# Patient Record
Sex: Female | Born: 1940
Health system: Southern US, Community
[De-identification: ages and names within clinical notes are randomized; demographics above are authoritative.]

## PROBLEM LIST (undated history)

## (undated) DIAGNOSIS — E079 Disorder of thyroid, unspecified: Secondary | ICD-10-CM

## (undated) DIAGNOSIS — I480 Paroxysmal atrial fibrillation: Secondary | ICD-10-CM

## (undated) DIAGNOSIS — I10 Essential (primary) hypertension: Secondary | ICD-10-CM

## (undated) DIAGNOSIS — Z87898 Personal history of other specified conditions: Secondary | ICD-10-CM

## (undated) DIAGNOSIS — E876 Hypokalemia: Secondary | ICD-10-CM

## (undated) DIAGNOSIS — N870 Mild cervical dysplasia: Secondary | ICD-10-CM

## (undated) DIAGNOSIS — Z87828 Personal history of other (healed) physical injury and trauma: Secondary | ICD-10-CM

## (undated) DIAGNOSIS — E7800 Pure hypercholesterolemia, unspecified: Secondary | ICD-10-CM

## (undated) DIAGNOSIS — K635 Polyp of colon: Secondary | ICD-10-CM

## (undated) DIAGNOSIS — Z7901 Long term (current) use of anticoagulants: Secondary | ICD-10-CM

## (undated) DIAGNOSIS — G5700 Lesion of sciatic nerve, unspecified lower limb: Secondary | ICD-10-CM

## (undated) HISTORY — PX: REFRACTIVE SURGERY: SHX103

## (undated) HISTORY — DX: Mild cervical dysplasia: N87.0

## (undated) HISTORY — DX: Pure hypercholesterolemia, unspecified: E78.00

## (undated) HISTORY — PX: TUBAL LIGATION: SHX77

## (undated) HISTORY — DX: Lesion of sciatic nerve, unspecified lower limb: G57.00

## (undated) HISTORY — DX: Personal history of other (healed) physical injury and trauma: Z87.828

## (undated) HISTORY — PX: CATARACT EXTRACTION: SUR2

## (undated) HISTORY — DX: Hypokalemia: E87.6

## (undated) HISTORY — PX: COLPOSCOPY: SHX161

## (undated) HISTORY — DX: Disorder of thyroid, unspecified: E07.9

## (undated) HISTORY — DX: Personal history of other specified conditions: Z87.898

## (undated) HISTORY — DX: Polyp of colon: K63.5

## (undated) HISTORY — DX: Essential (primary) hypertension: I10

---
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
## 1994-02-28 HISTORY — PX: ROTATOR CUFF REPAIR: SHX139

## 1997-11-26 ENCOUNTER — Other Ambulatory Visit: Admission: RE | Admit: 1997-11-26 | Discharge: 1997-11-26 | Payer: Self-pay | Admitting: Obstetrics and Gynecology

## 1998-02-28 HISTORY — PX: FOOT SURGERY: SHX648

## 1999-03-31 ENCOUNTER — Other Ambulatory Visit: Admission: RE | Admit: 1999-03-31 | Discharge: 1999-03-31 | Payer: Self-pay | Admitting: Obstetrics and Gynecology

## 1999-07-02 ENCOUNTER — Encounter: Payer: Self-pay | Admitting: Internal Medicine

## 1999-07-02 ENCOUNTER — Encounter: Admission: RE | Admit: 1999-07-02 | Discharge: 1999-07-02 | Payer: Self-pay | Admitting: Internal Medicine

## 2000-05-25 ENCOUNTER — Ambulatory Visit (HOSPITAL_COMMUNITY): Admission: RE | Admit: 2000-05-25 | Discharge: 2000-05-25 | Payer: Self-pay | Admitting: *Deleted

## 2000-05-25 ENCOUNTER — Encounter: Payer: Self-pay | Admitting: *Deleted

## 2000-05-29 ENCOUNTER — Ambulatory Visit (HOSPITAL_BASED_OUTPATIENT_CLINIC_OR_DEPARTMENT_OTHER): Admission: RE | Admit: 2000-05-29 | Discharge: 2000-05-30 | Payer: Self-pay | Admitting: Orthopedic Surgery

## 2001-01-11 ENCOUNTER — Other Ambulatory Visit: Admission: RE | Admit: 2001-01-11 | Discharge: 2001-01-11 | Payer: Self-pay | Admitting: Obstetrics and Gynecology

## 2001-02-07 ENCOUNTER — Ambulatory Visit (HOSPITAL_COMMUNITY): Admission: RE | Admit: 2001-02-07 | Discharge: 2001-02-07 | Payer: Self-pay | Admitting: Gastroenterology

## 2002-12-20 ENCOUNTER — Encounter: Payer: Self-pay | Admitting: Neurosurgery

## 2002-12-20 ENCOUNTER — Encounter: Admission: RE | Admit: 2002-12-20 | Discharge: 2002-12-20 | Payer: Self-pay | Admitting: Neurosurgery

## 2003-02-11 ENCOUNTER — Encounter: Admission: RE | Admit: 2003-02-11 | Discharge: 2003-02-11 | Payer: Self-pay | Admitting: Neurosurgery

## 2003-03-01 HISTORY — PX: BACK SURGERY: SHX140

## 2003-03-28 ENCOUNTER — Encounter: Admission: RE | Admit: 2003-03-28 | Discharge: 2003-03-28 | Payer: Self-pay | Admitting: Neurosurgery

## 2003-03-28 ENCOUNTER — Other Ambulatory Visit: Admission: RE | Admit: 2003-03-28 | Discharge: 2003-03-28 | Payer: Self-pay | Admitting: Obstetrics and Gynecology

## 2003-08-15 ENCOUNTER — Encounter: Admission: RE | Admit: 2003-08-15 | Discharge: 2003-08-15 | Payer: Self-pay | Admitting: Neurosurgery

## 2003-09-05 ENCOUNTER — Encounter: Admission: RE | Admit: 2003-09-05 | Discharge: 2003-09-05 | Payer: Self-pay | Admitting: Neurosurgery

## 2004-01-14 ENCOUNTER — Ambulatory Visit: Payer: Self-pay | Admitting: Physical Medicine & Rehabilitation

## 2004-01-14 ENCOUNTER — Inpatient Hospital Stay (HOSPITAL_COMMUNITY): Admission: RE | Admit: 2004-01-14 | Discharge: 2004-01-19 | Payer: Self-pay | Admitting: Orthopaedic Surgery

## 2004-09-06 ENCOUNTER — Other Ambulatory Visit: Admission: RE | Admit: 2004-09-06 | Discharge: 2004-09-06 | Payer: Self-pay | Admitting: Obstetrics and Gynecology

## 2004-09-07 ENCOUNTER — Ambulatory Visit (HOSPITAL_COMMUNITY): Admission: RE | Admit: 2004-09-07 | Discharge: 2004-09-07 | Payer: Self-pay | Admitting: Addiction Medicine

## 2004-11-23 ENCOUNTER — Other Ambulatory Visit: Admission: RE | Admit: 2004-11-23 | Discharge: 2004-11-23 | Payer: Self-pay | Admitting: Obstetrics and Gynecology

## 2005-02-28 HISTORY — PX: CERVICAL BIOPSY  W/ LOOP ELECTRODE EXCISION: SUR135

## 2005-05-06 ENCOUNTER — Other Ambulatory Visit: Admission: RE | Admit: 2005-05-06 | Discharge: 2005-05-06 | Payer: Self-pay | Admitting: Obstetrics and Gynecology

## 2005-12-02 ENCOUNTER — Other Ambulatory Visit: Admission: RE | Admit: 2005-12-02 | Discharge: 2005-12-02 | Payer: Self-pay | Admitting: Obstetrics and Gynecology

## 2006-07-27 ENCOUNTER — Other Ambulatory Visit: Admission: RE | Admit: 2006-07-27 | Discharge: 2006-07-27 | Payer: Self-pay | Admitting: Obstetrics and Gynecology

## 2006-09-04 ENCOUNTER — Ambulatory Visit (HOSPITAL_COMMUNITY): Admission: RE | Admit: 2006-09-04 | Discharge: 2006-09-04 | Payer: Self-pay | Admitting: Obstetrics and Gynecology

## 2006-11-21 ENCOUNTER — Emergency Department (HOSPITAL_COMMUNITY): Admission: EM | Admit: 2006-11-21 | Discharge: 2006-11-21 | Payer: Self-pay | Admitting: *Deleted

## 2007-02-20 ENCOUNTER — Other Ambulatory Visit: Admission: RE | Admit: 2007-02-20 | Discharge: 2007-02-20 | Payer: Self-pay | Admitting: Obstetrics and Gynecology

## 2008-01-01 ENCOUNTER — Ambulatory Visit (HOSPITAL_COMMUNITY): Admission: RE | Admit: 2008-01-01 | Discharge: 2008-01-01 | Payer: Self-pay | Admitting: Obstetrics and Gynecology

## 2008-03-21 ENCOUNTER — Ambulatory Visit: Payer: Self-pay | Admitting: Obstetrics and Gynecology

## 2008-03-21 ENCOUNTER — Encounter: Payer: Self-pay | Admitting: Obstetrics and Gynecology

## 2008-03-21 ENCOUNTER — Other Ambulatory Visit: Admission: RE | Admit: 2008-03-21 | Discharge: 2008-03-21 | Payer: Self-pay | Admitting: Obstetrics and Gynecology

## 2009-03-31 ENCOUNTER — Ambulatory Visit (HOSPITAL_COMMUNITY): Admission: RE | Admit: 2009-03-31 | Discharge: 2009-03-31 | Payer: Self-pay | Admitting: Obstetrics and Gynecology

## 2009-03-31 ENCOUNTER — Ambulatory Visit: Payer: Self-pay | Admitting: Obstetrics and Gynecology

## 2009-03-31 ENCOUNTER — Other Ambulatory Visit: Admission: RE | Admit: 2009-03-31 | Discharge: 2009-03-31 | Payer: Self-pay | Admitting: Obstetrics and Gynecology

## 2009-04-23 ENCOUNTER — Ambulatory Visit: Payer: Self-pay | Admitting: Obstetrics and Gynecology

## 2010-03-20 ENCOUNTER — Encounter: Payer: Self-pay | Admitting: Neurosurgery

## 2010-03-20 ENCOUNTER — Encounter: Payer: Self-pay | Admitting: Internal Medicine

## 2010-03-21 ENCOUNTER — Encounter: Payer: Self-pay | Admitting: Obstetrics and Gynecology

## 2010-04-26 ENCOUNTER — Other Ambulatory Visit (HOSPITAL_COMMUNITY): Payer: Self-pay | Admitting: Internal Medicine

## 2010-04-26 DIAGNOSIS — Z1231 Encounter for screening mammogram for malignant neoplasm of breast: Secondary | ICD-10-CM

## 2010-05-11 ENCOUNTER — Ambulatory Visit (HOSPITAL_COMMUNITY)
Admission: RE | Admit: 2010-05-11 | Discharge: 2010-05-11 | Disposition: A | Discharge status: Home / Self Care | Payer: No Typology Code available for payment source | Source: Ambulatory Visit | Attending: Internal Medicine | Admitting: Internal Medicine

## 2010-05-11 DIAGNOSIS — Z1231 Encounter for screening mammogram for malignant neoplasm of breast: Secondary | ICD-10-CM | POA: Insufficient documentation

## 2010-06-09 ENCOUNTER — Other Ambulatory Visit (HOSPITAL_COMMUNITY)
Admission: RE | Admit: 2010-06-09 | Discharge: 2010-06-09 | Disposition: A | Discharge status: Home / Self Care | Payer: No Typology Code available for payment source | Source: Ambulatory Visit | Attending: Obstetrics and Gynecology | Admitting: Obstetrics and Gynecology

## 2010-06-09 ENCOUNTER — Encounter (INDEPENDENT_AMBULATORY_CARE_PROVIDER_SITE_OTHER): Payer: No Typology Code available for payment source | Admitting: Obstetrics and Gynecology

## 2010-06-09 ENCOUNTER — Other Ambulatory Visit: Payer: Self-pay | Admitting: Obstetrics and Gynecology

## 2010-06-09 DIAGNOSIS — M899 Disorder of bone, unspecified: Secondary | ICD-10-CM

## 2010-06-09 DIAGNOSIS — N951 Menopausal and female climacteric states: Secondary | ICD-10-CM

## 2010-06-09 DIAGNOSIS — Z124 Encounter for screening for malignant neoplasm of cervix: Secondary | ICD-10-CM | POA: Insufficient documentation

## 2010-06-09 DIAGNOSIS — M949 Disorder of cartilage, unspecified: Secondary | ICD-10-CM

## 2010-06-09 DIAGNOSIS — N871 Moderate cervical dysplasia: Secondary | ICD-10-CM

## 2010-07-16 NOTE — Discharge Summary (Signed)
NAMEHANIFAH, ROYSE                ACCOUNT NO.:  [id number]   MEDICAL RECORD NO.:  [id number]          PATIENT TYPE:  INP   LOCATION:  5003                         FACILITY:  [facility]   PHYSICIAN:  Sharolyn Douglas, M.D.        DATE OF BIRTH:  Oct 04, 1940   DATE OF ADMISSION:  01/14/2004  DATE OF DISCHARGE:  01/19/2004                                 DISCHARGE SUMMARY   ADMISSION DIAGNOSES:  1.  Spinal stenosis at L3-L4 and L4-L5.  2.  Hypertension.  3.  Hypothyroidism.  4.  Reflux.  5.  Anemia.   DISCHARGE DIAGNOSES:  1.  Status post lumbar laminectomy L3-L5, doing well.  2.  Postoperative anemia that was stable and did not require transfusion.  3.  Postoperative hyponatremia and hypokalemia, both mild.  4.  One episode of chest pain postoperatively.  5.  Hypertension.  6.  Hypothyroidism.  7.  Reflux.   CONSULTATIONS:  None.   PROCEDURE:  On January 14, 2004, the patient was taken to the operating  room for a lumbar laminectomy at L3-L5, surgeon Dr. Sharolyn Douglas, assistant  [facility], P.A.-C.  Anesthesia was general.   LABORATORY DATA AND X-RAY FINDINGS:  On November 16, the patient's CBC with  differential was within normal limits with the exception of red cells of  3.78, hemoglobin 11.8, hematocrit 34.5.  Postoperatively, H&H was monitored  x2 days and reached a low of 9.9 and 28.6.  On November 18, she was  asymptomatic and did not require any treatment.  PT/INR and PTT was within  normal limits preoperatively.  Complete metabolic panel preoperatively was  normal with the exception of a potassium at 3.3.  Postoperatively, basic  metabolic panel was ordered on postop day #1.  She had mild hyponatremia at  133 and mild hypokalemia at 3.4 with glucose of 116, otherwise normal.  Cardiac enzymes from January 16, 2004, showed elevated CK at 375 and 328.  CK-MB and relative index were normal.  UA from January 16, did show small  amount of bilirubin with 15 ketones, 30 protein, small  amount of leukocyte  esterase, few epithelials and few bacteria.   HISTORY OF PRESENT ILLNESS:  This is a 70 year old female seen by Dr. Noel Gerold  for continuing progressive problems concerning her low back with radiation  into her left lower extremity.  The discomfort had been increasing and  failed to improve with any medications, time, rest, injections and pain was  progressively getting worse interfering with her activities of daily living  and quality of life.  She was found to have severe spinal stenosis.  It was  thought that her best course of management would be lumbar laminectomy  because of her failure to improve and severe symptoms.  Risks and benefits  of the procedure were discussed with the patient by Dr. Noel Gerold as well as  with myself.  She indicated understanding and opted to proceed.   HOSPITAL COURSE:  On January 14, 2004, the patient was taken to the  operating room for the above-mentioned procedure.  She tolerated the  procedure  well without any intraoperative complications.  She was  transferred to the recovery room in stable condition.  Postoperatively,  routine orthopedic and spine protocol were followed.  She progressed along  very well through postop day #1.  Her diet was advanced to an 1800 calorie  ADA diet.  She got up and ambulated with physical therapy.  She continued to  take p.o. pain medications.  It was thought that she would likely be  discharged the following day.  However, the following day she continued to  do well, but she was progressing slowly with physical therapy, therefore  discharge was canceled that day.  That evening, she developed some chest  pain.  The P.A. on call as well as the patient's medical doctor were  consulted.  The patient's blood pressure was elevated at 186/102.  Stat  cardiac enzymes were ordered which results were previously dictated in the  labs.  EKG was completed.  She was started on O2 and used sublingual  nitroglycerin  for any continued chest pain.  The patient's blood pressure  medication was also given as well as a one time dose of clonidine 0.1 mg.  She responded very well to all of these treatments.  By the following day,  she was feeling much better and was not having any further chest pain and  did not redevelop any chest pain throughout her hospital stay.  Because of  her slow progress with physical therapy, a rehabilitation consult was  ordered.  It was felt that she may be an appropriate candidate for  rehabilitation, however, because she was progressing along, it was thought  she may be more suited for going home with home health physical therapy.  She continued to progress over the next couple of days and by January 19, 2004, she was medically stable and ready.  She was not having any chest pain  or shortness of breath.  From an orthopedic standpoint, she is also stable  and ready for discharge independent with all activities.   CONDITION ON DISCHARGE:  The patient is a 70 year old female, status post  lumbar laminectomy doing well.  She is stable on discharge.   ACTIVITY:  Daily ambulation with brace as needed.  Back precautions.  No  heavy lifting.   WOUND CARE:  Daily dressing changes.  Keep incision dry until drainage  stops.   FOLLOW UP:  Follow-up appointment in 2 weeks with Dr. Noel Gerold.   DIET:  Regular home diet.   DISCHARGE MEDICATIONS:  1.  Vicodin p.r.n. pain.  2.  Robaxin p.r.n. muscle spasm.  3.  Multivitamin daily.  4.  Calcium daily.  5.  Laxative if needed.  6.  Avoid antiinflammatory.  7.  Colace twice daily.  8.  Resume home medications.   DISPOSITION:  The patient is being discharged to home with her family's  assistance as well as home health physical therapy and occupational therapy.     CM/MEDQ  D:  03/31/2004  T:  03/31/2004  Job:  [id number]

## 2010-07-16 NOTE — Op Note (Signed)
[place]  Patient:    Tamara Larson, Tamara Larson                         MRN: [id number] Proc. Date: 05/29/00 Attending:  Elana Alm. Thurston Hole, M.D.                           Operative Report  PREOPERATIVE DIAGNOSES: 1. Right shoulder rotator cuff tear. 2. Right shoulder impingement. 3. Right shoulder acromioclavicular joint arthrosis and spurring.  POSTOPERATIVE DIAGNOSES: 1. Right shoulder rotator cuff tear. 2. Right shoulder impingement. 3. Right shoulder acromioclavicular joint arthrosis and spurring.  OPERATION: 1. Right shoulder examination under anesthesia followed by arthroscopic    subacromial decompression and debridement. 2. Right shoulder distal clavicle excision. 3. Right shoulder mini-open rotator cuff repair using Arthrex suture anchor.  SURGEON:  Elana Alm. Thurston Hole, M.D.  ASSISTANT:  Gifford Shave, P.A.  ANESTHESIA:  General.  OPERATIVE TIME:  1 hour.  COMPLICATIONS:  None.  INDICATION:  Ms. Tamara Larson is a 70 year old woman who has had significant pain in her right shoulder for the past two years, much worse over the past six months, with signs and symptoms and MRI documenting a complete rotator cuff tear.  She has failed conservative care and is now to undergo arthroscopy and rotator cuff repair.  DESCRIPTION OF PROCEDURE:  Tamara Larson was brought to the operating room on May 29, 2000 after supraclavicular block had been placed in the holding room. She was placed on the operating table in supine position.  After being placed under general anesthesia, her right shoulder was examined under anesthesia. She had full range of motion and her shoulder was stable to ligamentous exam.  She was then placed in a beach chair position and her shoulder and arm was prepped using sterile Betadine and draped using sterile technique.  She received Ancef 1 gm intraoperatively for prophylaxis.  Initially, the arthroscopy was performed through a  posterior arthroscopic portal.  The arthroscope with a pump attached was placed and through an anterior portal an arthroscopic probe was placed.  On initial inspection, the articular cartilage and the glenohumeral joint was normal.  Anterior and posterior labrum showed 20% tearing which was debrided.  Superior labrum and biceps tendon anchor was intact.  Biceps tendon was intact.  Inferior labrum and anterior-inferior glenohumeral ligaments were intact.  Rotator cuff was thoroughly inspected.  She was found to have a complete tear of the supraspinatus, partial undersurface tear of the infraspinatus and this was debrided.  Subscapularis and teres minor was intact and the inferior capsule recess was free of pathology.  Subacromial space was entered.  Moderate bursitis was resected.  Subacromial decompression was carried out removing 6 to 8 mm of the undersurface of the anterior, anterior-lateral, and anterior-medial acromion.  CA ligament release carried out.  The [facility] joint was exposed.  Significant spurring and degenerative changes noted in this joint and the distal 6 mm of the clavicle was resected with a 6 mm bur.  After this was done, then a mini incision was made through the lateral portal 3 to 4 cm.  Deltoid was split longitudinally.  Subacromial space was entered. The rotator cuff tear was well visualized.  It was repaired with two separate #2 Panacryl sutures with a side-to-side repair and then an Arthrex suture anchor placed in the greater tuberosity and each one of these sutures placed through the rotator cuff and tied  down, thus securing the rotator cuff tear both with a side-to-side and primary repair back down to the greater tuberosity.  After this was done, the shoulder could be brought through a full range of motion with no impingement on the repair.  The wound was irrigated and then closed using 0 Vicryl to close the deltoid fascia.  A subacromial pain catheter was placed  for postoperative pain control.  Subcuticular layer and subcutaneous layer closed with 2-0 Vicryl and 3-0 Prolene.  Steri-Strips were applied.  Sterile dressing and a sling applied.  Then the patient awakened and taken to the recovery room in stable condition.  FOLLOW-UP:  Ms. Tamara Larson will be followed overnight at the recovery care center for IV pain control and neurovascular monitoring.  Discharge tomorrow on Percocet and Naprosyn.  She will remove her pain pump per our instructions in 48 hours.  Begin early physical therapy.  See her back in the office in a week for sutures out and follow-up. DD:  05/29/00 TD:  05/29/00 Job: [id number]

## 2010-07-16 NOTE — H&P (Signed)
Tamara Larson, Tamara Larson                ACCOUNT NO.:  [id number]   MEDICAL RECORD NO.:  [id number]          PATIENT TYPE:  INP   LOCATION:                               FACILITY:  [facility]   PHYSICIAN:  Sharolyn Douglas, M.D.        DATE OF BIRTH:  1941/01/13   DATE OF ADMISSION:  01/14/2004  DATE OF DISCHARGE:                                HISTORY & PHYSICAL   CHIEF COMPLAINT:  Pain in my back.   HISTORY OF PRESENT ILLNESS:  This 70 year old female is seen by [place] for  continued and progressive problems concerning pain in her low back with  occasional radiation to the left lower extremity. She has had an opinion  from a neurosurgeon, but chose [place] to be the treating physician. She  describes her discomfort as being increased with any bending, standing, or  walking. Only with resting or lying flat is she really comfortable. She has  had epidural steroid injections as well as anti-inflammatories.  Unfortunately, it did not last very long and now she is having continuing  problems to the point where she is having problems with her day to day  activity. 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 X-rays have shown severe spinal stenosis and  spondylolisthesis, and MRI has confirmed these findings specifically at L4-5  and L3-4.  Examination reveals difficulty ambulating with an obvious  antalgic gait. She has extreme difficulty coming to a standing position from  a seated position. Reflexes, motor strength, and sensory are intact to the  lower extremity. Negative clonus.   After much discussion and the fact that we have exhausted all conservative  care, it is felt that she would benefit from surgical intervention with  laminectomy at L3-4, L4-5.  Dr. Noel Gerold has discussed the risks and benefits  concerning this surgery.   PAST MEDICAL HISTORY:  The patient does have a family  physician, Dr.  Merlene Laughter. Renae Gloss, who has cleared her for surgery. She is currently being  treated for hypertension, hiatal hernia with reflux, occasional urinary  tract infection, and hypothyroidism.   CURRENT MEDICATIONS:  Neurontin t.i.d., diclofenac b.i.d., Diovan one daily,  and Synthroid one daily (I asked her to please bring the milligrams of these  medications to the hospital with her).   ALLERGIES:  No known drug allergies.   PAST SURGICAL HISTORY:  Rotator cuff repair of the right shoulder in 2002  and left foot surgery in 2003.   FAMILY HISTORY:  Positive for hypertension, diabetes, cancer, and arthritis.   SOCIAL HISTORY:  The patient is widowed. 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 She has no intake of alcohol or  tobacco products. She has one child and caregiver after surgery will be her  daughter and her brother.   REVIEW OF SYSTEMS:  CNS: No seizure disorder, paralysis, numbness, or double  vision. The patient does have low back pain and radiculitis in the left  lower extremity consistent with nerve root irritation. RESPIRATORY:  No  shortness of breath, productive cough, or hemoptysis. CARDIOVASCULAR: No  chest pain, angina, or orthopnea. GASTROINTESTINAL: No nausea, vomiting,  melena, or bloody stool. GENITOURINARY: No discharge, dysuria, or hematuria.  MUSCULOSKELETAL: Primarily in present illness.   PHYSICAL EXAMINATION:  VITAL SIGNS: Pulse 72 and regular, respirations 12  and unlabored, blood pressure 130/86 seated.  GENERAL: Alert, cooperative, and friendly 70 year old white female.  HEENT:  Normocephalic. PERRLA. Oropharynx is clear. EOMs are intact.  NECK: Supple. No lymphadenopathy.  CHEST: Clear to auscultation. No rales or rhonchi.  HEART: Regular rate and rhythm.  There is a grade 2/6 holosystolic murmur  heard best at the right sternal border, nonradiating to the neck.  ABDOMEN: Soft, nontender. Liver and spleen not  felt.  RECTAL/BREASTS/PELVIC/GENITALIA: Not done; not pertinent to the present  illness.  EXTREMITIES: As in present illness above.   ADMISSION DIAGNOSES:  1.  Spinal stenosis, L3-4 and L4-5.  2.  Hypertension.  3.  Hypothyroidism.  4.  Reflux.   PLAN:  The patient will undergo lumbar laminectomy at L3-4 and L4-5.  She  will need to be out of work after this surgery and she will notify her  employer of same.       ________________________________________  Druscilla Brownie Cherlynn June.  ___________________________________________  Sharolyn Douglas, M.D.    DLU/MEDQ  D:  01/07/2004  T:  01/07/2004  Job:  [id number]   cc:   Merlene Laughter. Renae Gloss, M.D.  [address]  [address]  [place]  [place] [address]  Fax: [phone]

## 2010-07-16 NOTE — Op Note (Signed)
NAMESYMPHANIE, Tamara Larson                ACCOUNT NO.:  [id number]   MEDICAL RECORD NO.:  [id number]          PATIENT TYPE:  INP   LOCATION:  5003                         FACILITY:  [facility]   PHYSICIAN:  Sharolyn Douglas, M.D.        DATE OF BIRTH:  Jul 24, 1940   DATE OF PROCEDURE:  01/15/2004  DATE OF DISCHARGE:                                 OPERATIVE REPORT   PREOPERATIVE DIAGNOSIS:  Severe lumbar spinal stenosis and scoliosis.   POSTOPERATIVE DIAGNOSIS:  Severe lumbar spinal stenosis and scoliosis.   OPERATION PERFORMED:  L3-4 and L4-5 decompressive laminectomy.   SURGEON:  Sharolyn Douglas, M.D.   ASSISTANT:  Verlin Fester, P.A.   ANESTHESIA:  General endotracheal.   COMPLICATIONS:  None.   INDICATIONS FOR PROCEDURE:  The patient is a pleasant 70 year old female  with severe spinal stenosis L3-4 and L4-5.  She also has an underlying  lumbar scoliosis.  She has symptoms consistent with neurogenic claudication  and has elected to undergo laminectomy and L3-4 and L4-5 in hopes of  improving her symptoms.  She knows the risks and benefits.   DESCRIPTION OF PROCEDURE:  The patient was properly identified in the  holding area and taken to the operating room.  She underwent general  endotracheal anesthesia without difficulty.  She was given prophylactic IV  antibiotics.  She was carefully turned prone on the Wilson frame.  All bony  prominences were padded.  Face and eyes were protected at all times.  Back  prepped and draped in the usual sterile fashion.  A midline incision was  made over the L3-4 and L4-5 interspaces.  Dissection was carried sharply  through the deep fascia.  Subperiosteal exposure to the L3-4 and L4-5 facet  joints.  Deep retractors were placed.  Intraoperative x-rays confirmed our  location.  We then completed a wide laminectomy removing the entire spinous  process of L3 and L4, great care was taken to protect the pars and the  joints.  We found severe underlying spinal stenosis  at L4-5 with thickened  ligamentum.  We completed a lateral recess decompression out to the  pedicles.  Superior foraminotomies were completed.  In the end we felt we  had a good decompression of the thecal sac and nerve roots at each level.  The wound was irrigated.  Bleeding was controlled with Gelfoam and bipolar  electrocautery.  The deep fascia closed with #1 Vicryl suture.  Subcutaneous layer closed with 2-0 Vicryl, followed by running 3-0  subcuticular Vicryl suture on the skin edges.  Benzoin and Steri-Strips  placed.  Sterile dressing applied.  The patient turned supine extubated  without difficulty, transferred to recovery in stable condition able to move  the upper and lower extremities.       MC/MEDQ  D:  01/15/2004  T:  01/16/2004  Job:  [id number]

## 2010-12-09 LAB — COMPREHENSIVE METABOLIC PANEL
ALT: 21
AST: 20
Albumin: 4.2
Alkaline Phosphatase: 101
BUN: 16
CO2: 29
Calcium: 10.1
Chloride: 103
Creatinine, Ser: 1.11
GFR calc Af Amer: 60 — ABNORMAL LOW
GFR calc non Af Amer: 49 — ABNORMAL LOW
Glucose, Bld: 88
Potassium: 2.9 — ABNORMAL LOW
Sodium: 143
Total Bilirubin: 0.9
Total Protein: 7.5

## 2010-12-09 LAB — DIFFERENTIAL
Basophils Absolute: 0
Basophils Relative: 0
Eosinophils Absolute: 0.1
Eosinophils Relative: 1
Lymphocytes Relative: 31
Lymphs Abs: 2.8
Monocytes Absolute: 0.6
Monocytes Relative: 6
Neutro Abs: 5.6
Neutrophils Relative %: 61

## 2010-12-09 LAB — URINALYSIS, ROUTINE W REFLEX MICROSCOPIC
Bilirubin Urine: NEGATIVE
Glucose, UA: NEGATIVE
Hgb urine dipstick: NEGATIVE
Ketones, ur: NEGATIVE
Nitrite: NEGATIVE
Protein, ur: NEGATIVE
Specific Gravity, Urine: 1.011
Urobilinogen, UA: 0.2
pH: 7

## 2010-12-09 LAB — CBC
HCT: 37
Hemoglobin: 12.7
MCHC: 34.3
MCV: 91.4
Platelets: 285
RBC: 4.05
RDW: 13.7
WBC: 9.2

## 2010-12-09 LAB — URINE MICROSCOPIC-ADD ON

## 2010-12-09 LAB — B-NATRIURETIC PEPTIDE (CONVERTED LAB): Pro B Natriuretic peptide (BNP): <30

## 2010-12-09 LAB — URINE CULTURE: Colony Count: >=100000

## 2011-04-22 ENCOUNTER — Other Ambulatory Visit: Payer: Self-pay | Admitting: Obstetrics and Gynecology

## 2011-04-22 DIAGNOSIS — Z1231 Encounter for screening mammogram for malignant neoplasm of breast: Secondary | ICD-10-CM

## 2011-05-19 ENCOUNTER — Ambulatory Visit (HOSPITAL_COMMUNITY)
Admission: RE | Admit: 2011-05-19 | Discharge: 2011-05-19 | Disposition: A | Discharge status: Home / Self Care | Payer: Medicare Other | Source: Ambulatory Visit | Attending: Obstetrics and Gynecology | Admitting: Obstetrics and Gynecology

## 2011-05-19 DIAGNOSIS — Z1231 Encounter for screening mammogram for malignant neoplasm of breast: Secondary | ICD-10-CM | POA: Insufficient documentation

## 2011-06-06 ENCOUNTER — Encounter: Payer: Self-pay | Admitting: Gynecology

## 2011-06-06 DIAGNOSIS — N870 Mild cervical dysplasia: Secondary | ICD-10-CM | POA: Insufficient documentation

## 2011-06-14 ENCOUNTER — Ambulatory Visit (INDEPENDENT_AMBULATORY_CARE_PROVIDER_SITE_OTHER): Payer: Medicare Other | Admitting: Obstetrics and Gynecology

## 2011-06-14 ENCOUNTER — Encounter: Payer: Self-pay | Admitting: Obstetrics and Gynecology

## 2011-06-14 VITALS — BP 130/82 | Ht 68.0 in | Wt 187.0 lb

## 2011-06-14 DIAGNOSIS — N870 Mild cervical dysplasia: Secondary | ICD-10-CM

## 2011-06-14 DIAGNOSIS — M949 Disorder of cartilage, unspecified: Secondary | ICD-10-CM

## 2011-06-14 DIAGNOSIS — N951 Menopausal and female climacteric states: Secondary | ICD-10-CM

## 2011-06-14 DIAGNOSIS — M899 Disorder of bone, unspecified: Secondary | ICD-10-CM

## 2011-06-14 DIAGNOSIS — E785 Hyperlipidemia, unspecified: Secondary | ICD-10-CM | POA: Insufficient documentation

## 2011-06-14 DIAGNOSIS — E079 Disorder of thyroid, unspecified: Secondary | ICD-10-CM | POA: Insufficient documentation

## 2011-06-14 DIAGNOSIS — M8580 Other specified disorders of bone density and structure, unspecified site: Secondary | ICD-10-CM

## 2011-06-14 DIAGNOSIS — N952 Postmenopausal atrophic vaginitis: Secondary | ICD-10-CM

## 2011-06-14 DIAGNOSIS — N390 Urinary tract infection, site not specified: Secondary | ICD-10-CM

## 2011-06-14 DIAGNOSIS — Z780 Asymptomatic menopausal state: Secondary | ICD-10-CM

## 2011-06-14 NOTE — Progress Notes (Signed)
Patient came to see me today for further follow up. We did a LEEP on her in 2007. She had cervical dysplasia. She has had normal Paps since then. She is mild menopausal symptoms. They did not require intervention. She has vaginal dryness but does not require medication. She is having no vaginal bleeding. She is having no pelvic pain. She is up-to-date on mammograms. She does her bone densities through her PCP. She is on calcium and vitamin D. She is osteopenia. The last bone density did not indicate FRAX risk was calculated. She has had no fractures. She will only have nocturia if she stays up late drinking water. Her hypothyroidism remains stable. She is being treated for both hypertension and hyperlipidemia successfully through  her PCP.   ROS: 12 system review done. Only pertinent positives listed above.  Physical examination: HEENT within normal limits. Neck: Thyroid not large. No masses. Supraclavicular nodes: not enlarged. Breasts: Examined in both sitting and lying  position. No skin changes and no masses. Abdomen: Soft no guarding rebound or masses or hernia. Pelvic: External: Within normal limits. BUS: Within normal limits. Vaginal:within normal limits. Good estrogen effect. No evidence of cystocele rectocele or enterocele. Cervix: clean. Uterus: Normal size and shape. Adnexa: No masses. Rectovaginal exam: Confirmatory and negative. Extremities: Within normal limits.  Assessment: #1. Mild menopausal symptoms #2. CIN-1 status post LEEP #3. Atrophic vaginitis #4. Osteopenia  Plan: Continue yearly mammograms. Bone density followup through PCP.

## 2011-06-15 LAB — URINALYSIS W MICROSCOPIC + REFLEX CULTURE
Bacteria, UA: NONE SEEN
Nitrite: NEGATIVE
Specific Gravity, Urine: 1.023 (ref 1.005–1.030)
Urobilinogen, UA: 1 mg/dL (ref 0.0–1.0)
pH: 7 (ref 5.0–8.0)

## 2011-06-16 LAB — URINE CULTURE

## 2012-02-29 DIAGNOSIS — Z87828 Personal history of other (healed) physical injury and trauma: Secondary | ICD-10-CM

## 2012-02-29 HISTORY — DX: Personal history of other (healed) physical injury and trauma: Z87.828

## 2012-02-29 HISTORY — PX: STERNUM FRACTURE SURGERY: SHX790

## 2012-09-10 ENCOUNTER — Other Ambulatory Visit: Payer: Self-pay | Admitting: Gynecology

## 2012-09-10 DIAGNOSIS — Z1231 Encounter for screening mammogram for malignant neoplasm of breast: Secondary | ICD-10-CM

## 2012-09-11 ENCOUNTER — Ambulatory Visit (HOSPITAL_COMMUNITY)
Admission: RE | Admit: 2012-09-11 | Discharge: 2012-09-11 | Disposition: A | Discharge status: Home / Self Care | Payer: Medicare Other | Source: Ambulatory Visit | Attending: Gynecology | Admitting: Gynecology

## 2012-09-11 DIAGNOSIS — Z1231 Encounter for screening mammogram for malignant neoplasm of breast: Secondary | ICD-10-CM | POA: Insufficient documentation

## 2012-09-13 ENCOUNTER — Other Ambulatory Visit: Payer: Self-pay | Admitting: Gynecology

## 2012-09-13 DIAGNOSIS — R928 Other abnormal and inconclusive findings on diagnostic imaging of breast: Secondary | ICD-10-CM

## 2012-09-26 ENCOUNTER — Ambulatory Visit
Admission: RE | Admit: 2012-09-26 | Discharge: 2012-09-26 | Disposition: A | Discharge status: Home / Self Care | Payer: Medicare Other | Source: Ambulatory Visit | Attending: Gynecology | Admitting: Gynecology

## 2012-09-26 DIAGNOSIS — R928 Other abnormal and inconclusive findings on diagnostic imaging of breast: Secondary | ICD-10-CM

## 2012-10-17 ENCOUNTER — Encounter: Payer: Self-pay | Admitting: Gynecology

## 2012-10-18 ENCOUNTER — Encounter: Payer: Self-pay | Admitting: Gynecology

## 2012-10-18 ENCOUNTER — Ambulatory Visit (INDEPENDENT_AMBULATORY_CARE_PROVIDER_SITE_OTHER): Payer: Medicare Other | Admitting: Gynecology

## 2012-10-18 VITALS — BP 124/78 | Ht 67.0 in | Wt 185.0 lb

## 2012-10-18 DIAGNOSIS — N952 Postmenopausal atrophic vaginitis: Secondary | ICD-10-CM

## 2012-10-18 DIAGNOSIS — M8580 Other specified disorders of bone density and structure, unspecified site: Secondary | ICD-10-CM

## 2012-10-18 DIAGNOSIS — M899 Disorder of bone, unspecified: Secondary | ICD-10-CM

## 2012-10-18 NOTE — Progress Notes (Signed)
Tamara Larson [birth year] [id number]        72 y.o.  G2P1011 for followup exam.  Former patient Dr. Eda Paschal. Several issues noted below.  Past medical history,surgical history, medications, allergies, family history and social history were all reviewed and documented in the EPIC chart.  ROS:  Performed and pertinent positives and negatives are included in the history, assessment and plan .  Exam: Kim assistant Filed Vitals:   10/18/12 1547  BP: 124/78  Height: 5\' 7"  (1.702 m)  Weight: 185 lb (83.915 kg)   General appearance  Normal Skin grossly normal Head/Neck normal with no cervical or supraclavicular adenopathy thyroid normal Lungs  clear Cardiac RR, without RMG Abdominal  soft, nontender, without masses, organomegaly or hernia Breasts  examined lying and sitting without masses, retractions, discharge or axillary adenopathy. Pelvic  Ext/BUS/vagina  normal with atrophic changes  Cervix  normal with atrophic changes  Uterus  axial, normal size, shape and contour, midline and mobile nontender   Adnexa  Without masses or tenderness    Anus and perineum  normal   Rectovaginal  normal sphincter tone without palpated masses or tenderness.    Assessment/Plan:  72 y.o. G76P1011 female for followup exam.   1. Postmenopausal/atrophic genital changes. Patient without significant hot flashes, night sweats or vaginal dryness. No bleeding. We'll continue to monitor. Report any bleeding. 2. Osteopenia. DEXA 2010 historically.  Being followed by Dr. Renae Gloss. She will continue to followup with him. Increased calcium vitamin D reviewed. 3. Pap smear 2012. No Pap smear done today. History of LGSIL with leak 2007. Pap smears normal since then. Will plan repeat Pap smear next year at three-year interval. 4. Mammography 08/2012. Continue with annual mammography. SBE monthly reviewed. 5. Colonoscopy 2010. Repeated today are recommended interval. 6. Health maintenance. No blood work done as it is all  done through Dr. Mathews Robinsons office. Followup one year, sooner as needed.  Note: This document was prepared with digital dictation and possible smart phrase technology. Any transcriptional errors that result from this process are unintentional.   Dara Lords MD, 4:34 PM 10/18/2012

## 2012-10-18 NOTE — Patient Instructions (Signed)
Followup in one year for annual exam. Sooner if any issues. 

## 2012-10-19 LAB — URINALYSIS W MICROSCOPIC + REFLEX CULTURE
Bacteria, UA: NONE SEEN
Hgb urine dipstick: NEGATIVE
Nitrite: NEGATIVE
Protein, ur: 30 mg/dL — AB
Urobilinogen, UA: 1 mg/dL (ref 0.0–1.0)
pH: 6 (ref 5.0–8.0)

## 2012-10-22 ENCOUNTER — Other Ambulatory Visit: Payer: Self-pay | Admitting: Gynecology

## 2012-10-22 LAB — URINE CULTURE: Colony Count: 80000

## 2012-10-22 MED ORDER — SULFAMETHOXAZOLE-TMP DS 800-160 MG PO TABS: 1.0000 | ORAL_TABLET | Freq: Two times a day (BID) | ORAL | Status: DC

## 2012-10-25 ENCOUNTER — Encounter: Payer: Self-pay | Admitting: Gynecology

## 2012-12-22 ENCOUNTER — Emergency Department (HOSPITAL_COMMUNITY): Payer: Medicare Other

## 2012-12-22 ENCOUNTER — Encounter (HOSPITAL_COMMUNITY): Payer: Self-pay | Admitting: Emergency Medicine

## 2012-12-22 ENCOUNTER — Inpatient Hospital Stay (HOSPITAL_COMMUNITY)
Admission: EM | Admit: 2012-12-22 | Discharge: 2012-12-27 | DRG: 493 | Disposition: A | Discharge status: Home / Self Care | Payer: Medicare Other | Attending: General Surgery | Admitting: General Surgery

## 2012-12-22 DIAGNOSIS — S2220XA Unspecified fracture of sternum, initial encounter for closed fracture: Secondary | ICD-10-CM

## 2012-12-22 DIAGNOSIS — Z87891 Personal history of nicotine dependence: Secondary | ICD-10-CM

## 2012-12-22 DIAGNOSIS — E7800 Pure hypercholesterolemia, unspecified: Secondary | ICD-10-CM | POA: Diagnosis present

## 2012-12-22 DIAGNOSIS — S82899A Other fracture of unspecified lower leg, initial encounter for closed fracture: Secondary | ICD-10-CM

## 2012-12-22 DIAGNOSIS — I498 Other specified cardiac arrhythmias: Secondary | ICD-10-CM | POA: Diagnosis present

## 2012-12-22 DIAGNOSIS — R079 Chest pain, unspecified: Secondary | ICD-10-CM

## 2012-12-22 DIAGNOSIS — S8253XA Displaced fracture of medial malleolus of unspecified tibia, initial encounter for closed fracture: Principal | ICD-10-CM | POA: Diagnosis present

## 2012-12-22 DIAGNOSIS — S8251XA Displaced fracture of medial malleolus of right tibia, initial encounter for closed fracture: Secondary | ICD-10-CM

## 2012-12-22 DIAGNOSIS — I519 Heart disease, unspecified: Secondary | ICD-10-CM

## 2012-12-22 DIAGNOSIS — I10 Essential (primary) hypertension: Secondary | ICD-10-CM | POA: Diagnosis present

## 2012-12-22 DIAGNOSIS — Z91013 Allergy to seafood: Secondary | ICD-10-CM

## 2012-12-22 DIAGNOSIS — I959 Hypotension, unspecified: Secondary | ICD-10-CM | POA: Diagnosis present

## 2012-12-22 DIAGNOSIS — E039 Hypothyroidism, unspecified: Secondary | ICD-10-CM | POA: Diagnosis present

## 2012-12-22 DIAGNOSIS — R0789 Other chest pain: Secondary | ICD-10-CM

## 2012-12-22 LAB — CBC
Hemoglobin: 12.3 g/dL (ref 12.0–15.0)
MCH: 32.2 pg (ref 26.0–34.0)
Platelets: 261 10*3/uL (ref 150–400)
RBC: 3.82 MIL/uL — ABNORMAL LOW (ref 3.87–5.11)
WBC: 9.4 10*3/uL (ref 4.0–10.5)

## 2012-12-22 LAB — COMPREHENSIVE METABOLIC PANEL
ALT: 17 U/L (ref 0–35)
AST: 17 U/L (ref 0–37)
Alkaline Phosphatase: 94 U/L (ref 39–117)
CO2: 26 mEq/L (ref 19–32)
Calcium: 9.2 mg/dL (ref 8.4–10.5)
GFR calc Af Amer: 78 mL/min — ABNORMAL LOW (ref >90)
GFR calc non Af Amer: 67 mL/min — ABNORMAL LOW (ref >90)
Glucose, Bld: 121 mg/dL — ABNORMAL HIGH (ref 70–99)
Potassium: 3.4 mEq/L — ABNORMAL LOW (ref 3.5–5.1)
Sodium: 141 mEq/L (ref 135–145)

## 2012-12-22 MED ORDER — SODIUM CHLORIDE 0.9 % IV SOLN
INTRAVENOUS | Status: DC
  Administered 2012-12-23 – 2012-12-26 (×4): via INTRAVENOUS

## 2012-12-22 MED ORDER — ONDANSETRON HCL 4 MG/2ML IJ SOLN: 4.0000 mg | Freq: Once | INTRAMUSCULAR | Status: DC

## 2012-12-22 MED ORDER — ACETAMINOPHEN 325 MG PO TABS: 650.0000 mg | ORAL_TABLET | ORAL | Status: DC | PRN

## 2012-12-22 MED ORDER — ONDANSETRON HCL 4 MG PO TABS: 4.0000 mg | ORAL_TABLET | Freq: Four times a day (QID) | ORAL | Status: DC | PRN

## 2012-12-22 MED ORDER — MORPHINE SULFATE 2 MG/ML IJ SOLN
2.0000 mg | INTRAMUSCULAR | Status: DC | PRN
  Administered 2012-12-22 – 2012-12-25 (×12): 2 mg via INTRAVENOUS
  Filled 2012-12-22 (×12): qty 1

## 2012-12-22 MED ORDER — FENTANYL CITRATE 0.05 MG/ML IJ SOLN
50.0000 ug | Freq: Once | INTRAMUSCULAR | Status: AC
  Administered 2012-12-22: 50 ug via INTRAVENOUS
  Filled 2012-12-22: qty 2

## 2012-12-22 MED ORDER — PANTOPRAZOLE SODIUM 40 MG PO TBEC
40.0000 mg | DELAYED_RELEASE_TABLET | Freq: Every day | ORAL | Status: DC
  Administered 2012-12-24: 40 mg via ORAL
  Filled 2012-12-22: qty 1

## 2012-12-22 MED ORDER — MORPHINE SULFATE 4 MG/ML IJ SOLN
4.0000 mg | Freq: Once | INTRAMUSCULAR | Status: AC
  Administered 2012-12-22: 4 mg via INTRAVENOUS
  Filled 2012-12-22: qty 1

## 2012-12-22 MED ORDER — ONDANSETRON HCL 4 MG/2ML IJ SOLN: 4.0000 mg | Freq: Four times a day (QID) | INTRAMUSCULAR | Status: DC | PRN

## 2012-12-22 MED ORDER — PANTOPRAZOLE SODIUM 40 MG IV SOLR
40.0000 mg | Freq: Every day | INTRAVENOUS | Status: DC
  Administered 2012-12-23: 40 mg via INTRAVENOUS
  Filled 2012-12-22 (×4): qty 40

## 2012-12-22 MED ORDER — ONDANSETRON HCL 4 MG/2ML IJ SOLN
4.0000 mg | Freq: Once | INTRAMUSCULAR | Status: AC
  Administered 2012-12-22: 4 mg via INTRAVENOUS
  Filled 2012-12-22: qty 2

## 2012-12-22 MED ORDER — IOHEXOL 300 MG/ML  SOLN
100.0000 mL | Freq: Once | INTRAMUSCULAR | Status: AC | PRN
  Administered 2012-12-22: 100 mL via INTRAVENOUS

## 2012-12-22 NOTE — ED Notes (Signed)
Echocardiogram currently being performed at bedside.

## 2012-12-22 NOTE — ED Notes (Signed)
Patient transported to X-ray 
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
## 2012-12-22 NOTE — Progress Notes (Signed)
  Echocardiogram 2D Echocardiogram has been performed.  [place]ian Co 12/22/2012, 9:30 PM
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
## 2012-12-22 NOTE — ED Notes (Signed)
Patient's blood pressure began to drop- requested that Dr. Gregary Cromer come assess the patient. Dr. Gregary Cromer gave a verbal order for a saline bolus and for the patient to be placed on 2L O2 via [place]. Pt remained A&Ox4. Respirations even and unlabored- HR of 67 NSR.

## 2012-12-22 NOTE — ED Notes (Signed)
Cardiologist at bedside.  
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
## 2012-12-22 NOTE — ED Notes (Signed)
MD at bedside. 

## 2012-12-23 DIAGNOSIS — E7800 Pure hypercholesterolemia, unspecified: Secondary | ICD-10-CM

## 2012-12-23 LAB — BASIC METABOLIC PANEL
BUN: 14 mg/dL (ref 6–23)
CO2: 25 mEq/L (ref 19–32)
Calcium: 8.2 mg/dL — ABNORMAL LOW (ref 8.4–10.5)
Creatinine, Ser: 0.84 mg/dL (ref 0.50–1.10)
GFR calc non Af Amer: 69 mL/min — ABNORMAL LOW (ref >90)
Glucose, Bld: 105 mg/dL — ABNORMAL HIGH (ref 70–99)

## 2012-12-23 LAB — CBC
HCT: 31.1 % — ABNORMAL LOW (ref 36.0–46.0)
Hemoglobin: 10.2 g/dL — ABNORMAL LOW (ref 12.0–15.0)
MCH: 31.5 pg (ref 26.0–34.0)
MCHC: 32.8 g/dL (ref 30.0–36.0)
RBC: 3.24 MIL/uL — ABNORMAL LOW (ref 3.87–5.11)
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
## 2012-12-23 NOTE — Progress Notes (Signed)
UR Completed.  Tamara Larson Jane [phone] 12/23/2012  

## 2012-12-24 ENCOUNTER — Inpatient Hospital Stay (HOSPITAL_COMMUNITY): Payer: Medicare Other

## 2012-12-24 DIAGNOSIS — S8251XA Displaced fracture of medial malleolus of right tibia, initial encounter for closed fracture: Secondary | ICD-10-CM

## 2012-12-24 DIAGNOSIS — R071 Chest pain on breathing: Secondary | ICD-10-CM

## 2012-12-24 MED ORDER — DEXTROSE-NACL 5-0.45 % IV SOLN
100.0000 mL/h | INTRAVENOUS | Status: DC
  Administered 2012-12-24: 100 mL/h via INTRAVENOUS

## 2012-12-24 MED ORDER — ACETAMINOPHEN 500 MG PO TABS
1000.0000 mg | ORAL_TABLET | Freq: Once | ORAL | Status: AC
  Administered 2012-12-24: 1000 mg via ORAL
  Filled 2012-12-24: qty 2

## 2012-12-24 MED ORDER — CHLORHEXIDINE GLUCONATE 4 % EX LIQD
60.0000 mL | Freq: Once | CUTANEOUS | Status: AC
  Administered 2012-12-24: 4 via TOPICAL
  Filled 2012-12-24: qty 60

## 2012-12-24 MED ORDER — DEXTROSE 5 % IV SOLN
3.0000 g | INTRAVENOUS | Status: AC
  Administered 2012-12-25: 3 g via INTRAVENOUS
  Filled 2012-12-24 (×2): qty 3000

## 2012-12-24 MED ORDER — TRAMADOL HCL 50 MG PO TABS
50.0000 mg | ORAL_TABLET | Freq: Three times a day (TID) | ORAL | Status: DC
  Administered 2012-12-24 (×2): 50 mg via ORAL
  Filled 2012-12-24 (×2): qty 1

## 2012-12-24 NOTE — Progress Notes (Signed)
PT Cancellation Note  Patient Details Name: JEMIMA PETKO MRN: [id number] DOB: [birth year]   Cancelled Treatment:    Reason Eval/Treat Not Completed:  (awaiting Ortho consult). Will eval PT once ortho consult completed.   Marcene Brawn 12/24/2012, 1:41 PM

## 2012-12-24 NOTE — Progress Notes (Signed)
Attempted to call report to Cade, RN at [phone]. RN unavailable, on other line calling report. Will attempt again.  1725: Attempted to call report, RN and Consulting civil engineer unavailable at this time. Will continue to monitor patient.   Charge RN called back, asked to give RN experience. Will wait for Gabe RN to call back in 30 mins. Will attempt report at 1800.  1810 Report called to Gave, RN 5N. All belongings sent with patient. Pt. Transferred by bed via NT. CMT and eICU notified of transfer.
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
## 2012-12-24 NOTE — Progress Notes (Signed)
While getting up to bedside commode, pt endorsed discomfort and pain in R ankle/foot. Upon review of chart, no radiographs of RLE available from this admission. This RN will convey to day RN this information to relay to attending. Will continue to monitor and assess.
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
## 2012-12-24 NOTE — Progress Notes (Signed)
Appreciate Dr. Greig Right eval.  Patient examined and I agree with the assessment and plan  Violeta Gelinas, MD, MPH, FACS Pager: [phone]  12/24/2012 3:25 PM
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
## 2012-12-25 ENCOUNTER — Encounter (HOSPITAL_COMMUNITY): Payer: Medicare Other | Admitting: Certified Registered"

## 2012-12-25 ENCOUNTER — Encounter (HOSPITAL_COMMUNITY): Payer: Self-pay | Admitting: Anesthesiology

## 2012-12-25 ENCOUNTER — Inpatient Hospital Stay (HOSPITAL_COMMUNITY): Payer: Medicare Other | Admitting: Certified Registered"

## 2012-12-25 ENCOUNTER — Encounter (HOSPITAL_COMMUNITY): Admission: EM | Disposition: A | Discharge status: Home / Self Care | Payer: Self-pay | Source: Home / Self Care

## 2012-12-25 ENCOUNTER — Inpatient Hospital Stay (HOSPITAL_COMMUNITY): Payer: Medicare Other

## 2012-12-25 DIAGNOSIS — M1990 Unspecified osteoarthritis, unspecified site: Secondary | ICD-10-CM | POA: Insufficient documentation

## 2012-12-25 HISTORY — PX: ORIF ANKLE FRACTURE: SHX5408

## 2012-12-25 SURGERY — OPEN REDUCTION INTERNAL FIXATION (ORIF) ANKLE FRACTURE
Anesthesia: General | Site: Ankle | Laterality: Right | Wound class: Clean

## 2012-12-25 MED ORDER — BUPIVACAINE-EPINEPHRINE PF 0.5-1:200000 % IJ SOLN
INTRAMUSCULAR | Status: DC | PRN
  Administered 2012-12-25: 150 mg

## 2012-12-25 MED ORDER — PHENYLEPHRINE HCL 10 MG/ML IJ SOLN
INTRAMUSCULAR | Status: DC | PRN
  Administered 2012-12-25: 120 ug via INTRAVENOUS

## 2012-12-25 MED ORDER — PROMETHAZINE HCL 25 MG/ML IJ SOLN: 6.2500 mg | INTRAMUSCULAR | Status: DC | PRN

## 2012-12-25 MED ORDER — LIDOCAINE HCL (CARDIAC) 20 MG/ML IV SOLN
INTRAVENOUS | Status: DC | PRN
  Administered 2012-12-25: 30 mg via INTRAVENOUS

## 2012-12-25 MED ORDER — ASPIRIN 81 MG PO CHEW: 81.0000 mg | CHEWABLE_TABLET | Freq: Every day | ORAL | Status: DC

## 2012-12-25 MED ORDER — OXYCODONE HCL 5 MG/5ML PO SOLN: 5.0000 mg | Freq: Once | ORAL | Status: DC | PRN

## 2012-12-25 MED ORDER — MIDAZOLAM HCL 2 MG/2ML IJ SOLN
1.0000 mg | INTRAMUSCULAR | Status: DC | PRN
  Administered 2012-12-25: 1 mg via INTRAVENOUS

## 2012-12-25 MED ORDER — OXYCODONE HCL 5 MG PO TABS: 5.0000 mg | ORAL_TABLET | Freq: Once | ORAL | Status: DC | PRN

## 2012-12-25 MED ORDER — CEFAZOLIN SODIUM-DEXTROSE 2-3 GM-% IV SOLR
2.0000 g | Freq: Four times a day (QID) | INTRAVENOUS | Status: AC
  Administered 2012-12-25 – 2012-12-26 (×3): 2 g via INTRAVENOUS
  Filled 2012-12-25 (×3): qty 50

## 2012-12-25 MED ORDER — ASPIRIN 81 MG PO TABS: 81.0000 mg | ORAL_TABLET | Freq: Every day | ORAL | Status: DC

## 2012-12-25 MED ORDER — SIMVASTATIN 20 MG PO TABS
20.0000 mg | ORAL_TABLET | Freq: Every evening | ORAL | Status: DC
  Administered 2012-12-26: 20 mg via ORAL
  Filled 2012-12-25 (×3): qty 1

## 2012-12-25 MED ORDER — AMLODIPINE BESYLATE 10 MG PO TABS
10.0000 mg | ORAL_TABLET | Freq: Every day | ORAL | Status: DC
  Administered 2012-12-25 – 2012-12-27 (×2): 10 mg via ORAL
  Filled 2012-12-25 (×3): qty 1

## 2012-12-25 MED ORDER — GLYCOPYRROLATE 0.2 MG/ML IJ SOLN
INTRAMUSCULAR | Status: DC | PRN
  Administered 2012-12-25: 0.2 mg via INTRAVENOUS

## 2012-12-25 MED ORDER — ARTIFICIAL TEARS OP OINT
TOPICAL_OINTMENT | OPHTHALMIC | Status: DC | PRN
  Administered 2012-12-25: 1 via OPHTHALMIC

## 2012-12-25 MED ORDER — LEVOTHYROXINE SODIUM 175 MCG PO TABS
175.0000 ug | ORAL_TABLET | Freq: Every day | ORAL | Status: DC
  Administered 2012-12-25 – 2012-12-27 (×3): 175 ug via ORAL
  Filled 2012-12-25 (×4): qty 1

## 2012-12-25 MED ORDER — FENTANYL CITRATE 0.05 MG/ML IJ SOLN
INTRAMUSCULAR | Status: DC | PRN
  Administered 2012-12-25 (×2): 25 ug via INTRAVENOUS

## 2012-12-25 MED ORDER — FENTANYL CITRATE 0.05 MG/ML IJ SOLN: INTRAMUSCULAR | Status: DC | PRN

## 2012-12-25 MED ORDER — LOSARTAN POTASSIUM 50 MG PO TABS
100.0000 mg | ORAL_TABLET | Freq: Every day | ORAL | Status: DC
  Administered 2012-12-25 – 2012-12-27 (×3): 100 mg via ORAL
  Filled 2012-12-25 (×3): qty 2

## 2012-12-25 MED ORDER — MIDAZOLAM HCL 2 MG/2ML IJ SOLN
INTRAMUSCULAR | Status: AC
  Administered 2012-12-25: 1 mg via INTRAVENOUS
  Filled 2012-12-25: qty 2

## 2012-12-25 MED ORDER — DEXAMETHASONE SODIUM PHOSPHATE 4 MG/ML IJ SOLN
INTRAMUSCULAR | Status: DC | PRN
  Administered 2012-12-25: 4 mg via INTRAVENOUS

## 2012-12-25 MED ORDER — FENTANYL CITRATE 0.05 MG/ML IJ SOLN
INTRAMUSCULAR | Status: AC
  Administered 2012-12-25: 50 ug via INTRAVENOUS
  Filled 2012-12-25: qty 2

## 2012-12-25 MED ORDER — ONDANSETRON HCL 4 MG/2ML IJ SOLN
INTRAMUSCULAR | Status: DC | PRN
  Administered 2012-12-25: 4 mg via INTRAVENOUS

## 2012-12-25 MED ORDER — TRAMADOL HCL 50 MG PO TABS
50.0000 mg | ORAL_TABLET | Freq: Four times a day (QID) | ORAL | Status: DC | PRN
  Administered 2012-12-25: 50 mg via ORAL
  Administered 2012-12-26: 100 mg via ORAL
  Administered 2012-12-26: 50 mg via ORAL
  Administered 2012-12-26 – 2012-12-27 (×3): 100 mg via ORAL
  Filled 2012-12-25: qty 1
  Filled 2012-12-25 (×3): qty 2
  Filled 2012-12-25: qty 1
  Filled 2012-12-25: qty 2

## 2012-12-25 MED ORDER — LACTATED RINGERS IV SOLN
INTRAVENOUS | Status: DC | PRN
  Administered 2012-12-25: 15:00:00 via INTRAVENOUS

## 2012-12-25 MED ORDER — LACTATED RINGERS IV SOLN
INTRAVENOUS | Status: DC
  Administered 2012-12-25: 15:00:00 via INTRAVENOUS

## 2012-12-25 MED ORDER — HYDROMORPHONE HCL PF 1 MG/ML IJ SOLN: 0.2500 mg | INTRAMUSCULAR | Status: DC | PRN

## 2012-12-25 MED ORDER — ASPIRIN EC 325 MG PO TBEC
325.0000 mg | DELAYED_RELEASE_TABLET | Freq: Every day | ORAL | Status: DC
  Administered 2012-12-26 – 2012-12-27 (×2): 325 mg via ORAL
  Filled 2012-12-25 (×2): qty 1

## 2012-12-25 MED ORDER — FENTANYL CITRATE 0.05 MG/ML IJ SOLN
50.0000 ug | INTRAMUSCULAR | Status: DC | PRN
  Administered 2012-12-25: 50 ug via INTRAVENOUS

## 2012-12-25 MED ORDER — PROPOFOL 10 MG/ML IV BOLUS
INTRAVENOUS | Status: DC | PRN
  Administered 2012-12-25: 150 mg via INTRAVENOUS

## 2012-12-25 SURGICAL SUPPLY — 48 items
BANDAGE ELASTIC 6 VELCRO ST LF (GAUZE/BANDAGES/DRESSINGS) ×4 IMPLANT
BANDAGE ESMARK 6X9 LF (GAUZE/BANDAGES/DRESSINGS) IMPLANT
BIT DRILL CANN 2.7 ASNIS III (BIT) ×1 IMPLANT
BNDG CMPR 9X6 STRL LF SNTH (GAUZE/BANDAGES/DRESSINGS)
BNDG COHESIVE 4X5 TAN STRL (GAUZE/BANDAGES/DRESSINGS) ×2 IMPLANT
BNDG ESMARK 6X9 LF (GAUZE/BANDAGES/DRESSINGS)
CLOTH BEACON ORANGE TIMEOUT ST (SAFETY) ×2 IMPLANT
COVER SURGICAL LIGHT HANDLE (MISCELLANEOUS) ×2 IMPLANT
CUFF TOURNIQUET SINGLE 34IN LL (TOURNIQUET CUFF) IMPLANT
DRAPE OEC MINIVIEW 54X84 (DRAPES) IMPLANT
DRAPE U-SHAPE 47X51 STRL (DRAPES) IMPLANT
DRSG ADAPTIC 3X8 NADH LF (GAUZE/BANDAGES/DRESSINGS) ×2 IMPLANT
DRSG PAD ABDOMINAL 8X10 ST (GAUZE/BANDAGES/DRESSINGS) ×2 IMPLANT
DURAPREP 26ML APPLICATOR (WOUND CARE) ×2 IMPLANT
ELECT REM PT RETURN 9FT ADLT (ELECTROSURGICAL) ×2
ELECTRODE REM PT RTRN 9FT ADLT (ELECTROSURGICAL) ×1 IMPLANT
GLOVE BIO SURGEON STRL SZ7.5 (GLOVE) ×4 IMPLANT
GLOVE BIOGEL PI IND STRL 8 (GLOVE) ×1 IMPLANT
GLOVE BIOGEL PI INDICATOR 8 (GLOVE) ×1
GOWN STRL NON-REIN LRG LVL3 (GOWN DISPOSABLE) ×2 IMPLANT
K-WIRE ORTHOPEDIC 1.4X150L (WIRE) ×4
KIT BASIN OR (CUSTOM PROCEDURE TRAY) ×2 IMPLANT
KIT ROOM TURNOVER OR (KITS) ×2 IMPLANT
KWIRE ORTHOPEDIC 1.4X150L (WIRE) IMPLANT
MANIFOLD NEPTUNE II (INSTRUMENTS) ×2 IMPLANT
NS IRRIG 1000ML POUR BTL (IV SOLUTION) ×2 IMPLANT
PACK ORTHO EXTREMITY (CUSTOM PROCEDURE TRAY) ×2 IMPLANT
PAD ARMBOARD 7.5X6 YLW CONV (MISCELLANEOUS) ×4 IMPLANT
PAD CAST 4YDX4 CTTN HI CHSV (CAST SUPPLIES) ×2 IMPLANT
PADDING CAST COTTON 4X4 STRL (CAST SUPPLIES) ×2
PADDING CAST COTTON 6X4 STRL (CAST SUPPLIES) ×1 IMPLANT
SCREW CANN 44X15X4XSLF DRL (Screw) IMPLANT
SCREW CANNULATED 4.0X44MM (Screw) ×4 IMPLANT
SPONGE GAUZE 4X4 12PLY (GAUZE/BANDAGES/DRESSINGS) ×2 IMPLANT
SPONGE LAP 18X18 X RAY DECT (DISPOSABLE) ×2 IMPLANT
STAPLER VISISTAT 35W (STAPLE) IMPLANT
SUCTION FRAZIER TIP 10 FR DISP (SUCTIONS) ×2 IMPLANT
SUT MON AB 2-0 CT1 27 (SUTURE) ×4 IMPLANT
SUT VIC AB 0 CT1 27 (SUTURE) ×4
SUT VIC AB 0 CT1 27XBRD ANBCTR (SUTURE) ×2 IMPLANT
SYR BULB IRRIGATION 50ML (SYRINGE) ×2 IMPLANT
SYR CONTROL 10ML LL (SYRINGE) IMPLANT
TOWEL OR 17X24 6PK STRL BLUE (TOWEL DISPOSABLE) ×2 IMPLANT
TOWEL OR 17X26 10 PK STRL BLUE (TOWEL DISPOSABLE) ×2 IMPLANT
TUBE CONNECTING 12X1/4 (SUCTIONS) ×2 IMPLANT
UNDERPAD 30X30 INCONTINENT (UNDERPADS AND DIAPERS) ×2 IMPLANT
WATER STERILE IRR 1000ML POUR (IV SOLUTION) ×2 IMPLANT
YANKAUER SUCT BULB TIP NO VENT (SUCTIONS) IMPLANT

## 2012-12-25 NOTE — Progress Notes (Signed)
Going down to OR soon. I also spoke to her daughter. Patient examined and I agree with the assessment and plan  Violeta Gelinas, MD, MPH, FACS Pager: [phone]  12/25/2012 1:41 PM

## 2012-12-25 NOTE — Preoperative (Signed)
Beta Blockers   Reason not to administer Beta Blockers:Not Applicable 

## 2012-12-25 NOTE — Anesthesia Postprocedure Evaluation (Signed)
  Anesthesia Post-op Note  Patient: Tamara Larson  Procedure(s) Performed: Procedure(s): OPEN REDUCTION INTERNAL FIXATION (ORIF) ANKLE FRACTURE (Right)  Patient Location: PACU  Anesthesia Type:General and GA combined with regional for post-op pain  Level of Consciousness: awake, alert  and oriented  Airway and Oxygen Therapy: Patient Spontanous Breathing and Patient connected to nasal cannula oxygen  Post-op Pain: none  Post-op Assessment: Post-op Vital signs reviewed, Patient's Cardiovascular Status Stable, Respiratory Function Stable, Patent Airway and Pain level controlled  Post-op Vital Signs: stable  Complications: No apparent anesthesia complications

## 2012-12-25 NOTE — Anesthesia Preprocedure Evaluation (Addendum)
Anesthesia Evaluation  Patient identified by MRN, date of birth, ID band Patient awake    Reviewed: Allergy & Precautions, H&P , NPO status , Patient's Chart, lab work & pertinent test results  History of Anesthesia Complications Negative for: history of anesthetic complications  Airway Mallampati: I TM Distance: >3 FB Neck ROM: Full    Dental  (+) Teeth Intact and Dental Advisory Given   Pulmonary neg pulmonary ROS, former smoker,    Pulmonary exam normal       Cardiovascular hypertension, Pt. on medications     Neuro/Psych negative psych ROS   GI/Hepatic negative GI ROS, Neg liver ROS,   Endo/Other  Hypothyroidism   Renal/GU negative Renal ROS     Musculoskeletal  (+) Arthritis -,   Abdominal   Peds  Hematology   Anesthesia Other Findings   Reproductive/Obstetrics                          Anesthesia Physical Anesthesia Plan  ASA: III  Anesthesia Plan: General   Post-op Pain Management:    Induction: Intravenous  Airway Management Planned: LMA  Additional Equipment:   Intra-op Plan:   Post-operative Plan: Extubation in OR  Informed Consent: I have reviewed the patients History and Physical, chart, labs and discussed the procedure including the risks, benefits and alternatives for the proposed anesthesia with the patient or authorized representative who has indicated his/her understanding and acceptance.   Dental advisory given  Plan Discussed with: CRNA, Anesthesiologist and Surgeon  Anesthesia Plan Comments:         Anesthesia Quick Evaluation

## 2012-12-25 NOTE — Interval H&P Note (Signed)
History and Physical Interval Note:  12/25/2012 10:04 AM  Tamara Larson  has presented today for surgery, with the diagnosis of right ankle fracture  The various methods of treatment have been discussed with the patient and family. After consideration of risks, benefits and other options for treatment, the patient has consented to  Procedure(s) with comments: OPEN REDUCTION INTERNAL FIXATION (ORIF) ANKLE FRACTURE (Right) - stryker canulated screws  mini carm 45 min as a surgical intervention .  The patient's history has been reviewed, patient examined, no change in status, stable for surgery.  I have reviewed the patient's chart and labs.  Questions were answered to the patient's satisfaction.     Allene Furuya, D

## 2012-12-25 NOTE — Progress Notes (Signed)
PT Cancellation Note  Patient Details Name: Tamara Larson MRN: [id number] DOB: 25-Jun-1940   Cancelled Treatment:    Reason Eval/Treat Not Completed: Patient at procedure or test/unavailable (ORIF ankle today; Will proceed with PT eval postop)   Van Clines [facility] 12/25/2012, 11:07 AM

## 2012-12-25 NOTE — Op Note (Signed)
12/22/2012 - 12/25/2012  4:30 PM  PATIENT:  Tamara Larson    PRE-OPERATIVE DIAGNOSIS:  right ankle fracture  POST-OPERATIVE DIAGNOSIS:  Same  PROCEDURE:  OPEN REDUCTION INTERNAL FIXATION (ORIF) ANKLE FRACTURE  SURGEON:  Tavares Levinson, D, MD  ASSISTANT: none  ANESTHESIA:   gen  PREOPERATIVE INDICATIONS:  Tamara Larson is a  72 y.o. female with a diagnosis of right ankle fracture and elected for surgical management.    The risks benefits and alternatives were discussed with the patient preoperatively including but not limited to the risks of infection, bleeding, nerve injury, cardiopulmonary complications, the need for revision surgery, among others, and the patient was willing to proceed.  OPERATIVE IMPLANTS: Canulated screws  OPERATIVE FINDINGS: stable reduction  BLOOD LOSS: min  COMPLICATIONS: none  TOURNIQUET TIME:  OPERATIVE PROCEDURE:  Patient was identified in the preoperative holding area and site was marked by me He was transported to the operating theater and placed on the table in supine position taking care to pad all bony prominences. After a preincinduction time out anesthesia was induced. The Right lower extremity was prepped and draped in normal sterile fashion and a pre-incision timeout was performed. Tamara Larson received Ancef for preoperative antibiotics.   I performed a fluoroscopic examination of the ankle to confirm no unstable fracture on the lateral side.   I then made a 5 cm incision centered over the medial malleolus I dissected down carefully protecting the saphenous vein. I then incised the periosteum and immediately noted the fracture of the medial malleolus with interposed periosteum and soft tissue. I resected the soft tissue interposition. I then performed a reduction maneuver with a tenaculum. I took fluoroscopic images and was happy with the reduction the ankle. I palpated the anterior and posterior aspect and around the joint not felt  no step off. I then placed 2 K wires under fluoroscopic guidance one anterior and one posterior of the medial malleolus taking care to not penetrate the joint. I was happy with the position of both on the AP mortise and lateral views. I then drilled the outer cortex and selected two 44 mm partially-threaded cancellus screws. I inserted these under fluoroscopic guidance and again no penetration of the subchondral bone was noted. Again a good bite on both of these and sequentially tightened them. I then removed the K wires to final fluoroscopic views and was happy with the reduction of the fracture as well as placement of screws. I then thoroughly irrigated the wound and closed it with 2.0 and 4.0 Monocryl for the skin. Sterile dressing was placed and the patient was placed in a fracture boot.    POST OPERATIVE PLAN: Non-weightbearing. DVT prophylaxis will consist of scd's and ASA 325

## 2012-12-25 NOTE — Anesthesia Procedure Notes (Addendum)
Anesthesia Regional Block:  Popliteal block  Pre-Anesthetic Checklist: ,, timeout performed, Correct Patient, Correct Site, Correct Laterality, Correct Procedure,, site marked, risks and benefits discussed, Surgical consent,  Pre-op evaluation,  At surgeon's request and post-op pain management  Laterality: Right  Prep: chloraprep       Needles:  Injection technique: Single-shot  Needle Type: Echogenic Stimulator Needle          Additional Needles:  Procedures: ultrasound guided (picture in chart) and nerve stimulator Popliteal block  Nerve Stimulator or Paresthesia:  Response: plantar flexion, 0.45 mA,   Additional Responses:   Narrative:  Start time: 12/25/2012 3:10 PM End time: 12/25/2012 3:20 PM  Performed by: Personally  Anesthesiologist: J. Adonis Huguenin, MD  Additional Notes: A functioning IV was confirmed and monitors were applied.  Sterile prep and drape, hand hygiene and sterile gloves were used.  Negative aspiration and test dose prior to incremental administration of local anesthetic. The patient tolerated the procedure well.Ultrasound  guidance: relevant anatomy identified, needle position confirmed, local anesthetic spread visualized around nerve(s), vascular puncture avoided.  Image printed for medical record.   Popliteal block Procedure Name: LMA Insertion Date/Time: 12/25/2012 3:34 PM Performed by: Gayla Medicus Pre-anesthesia Checklist: Patient identified, Timeout performed, Emergency Drugs available, Suction available and Patient being monitored Patient Re-evaluated:Patient Re-evaluated prior to inductionOxygen Delivery Method: Circle system utilized Preoxygenation: Pre-oxygenation with 100% oxygen Intubation Type: IV induction LMA: LMA inserted LMA Size: 4.0 Number of attempts: 1 Placement Confirmation: positive ETCO2 and breath sounds checked- equal and bilateral Tube secured with: Tape Dental Injury: Teeth and Oropharynx as per pre-operative  assessment
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
## 2012-12-25 NOTE — Transfer of Care (Signed)
Immediate Anesthesia Transfer of Care Note  Patient: Tamara Larson  Procedure(s) Performed: Procedure(s): OPEN REDUCTION INTERNAL FIXATION (ORIF) ANKLE FRACTURE (Right)  Patient Location: PACU  Anesthesia Type:General  Level of Consciousness: awake, alert  and oriented  Airway & Oxygen Therapy: Patient Spontanous Breathing and Patient connected to nasal cannula oxygen  Post-op Assessment: Report given to PACU RN and Post -op Vital signs reviewed and stable  Post vital signs: Reviewed and stable  Complications: No apparent anesthesia complications

## 2012-12-25 NOTE — Progress Notes (Signed)
Orthopedic Tech Progress Note Patient Details:  Tamara Larson [birth year] [id number]  Ortho Devices Type of Ortho Device: CAM walker Ortho Device/Splint Interventions: Sarita Bottom 12/25/2012, 3:24 PM
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
## 2012-12-26 ENCOUNTER — Encounter (HOSPITAL_COMMUNITY): Payer: Self-pay | Admitting: Orthopedic Surgery
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
## 2012-12-26 NOTE — Progress Notes (Signed)
Feeling better. R toes sensate and moving. Will see how she does with therapies. She is quite determined to work hard. Patient examined and I agree with the assessment and plan  Violeta Gelinas, MD, MPH, FACS Pager: [phone]  12/26/2012 11:39 AM
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
## 2012-12-27 MED ORDER — TRAMADOL HCL 50 MG PO TABS
50.0000 mg | ORAL_TABLET | Freq: Four times a day (QID) | ORAL | Status: DC | PRN
Start: 2012-12-27 — End: 2013-11-14
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
## 2012-12-27 NOTE — Progress Notes (Signed)
D/C Patient examined and I agree with the assessment and plan  Violeta Gelinas, MD, MPH, FACS Pager: [phone]  12/27/2012 2:31 PM

## 2013-11-08 ENCOUNTER — Other Ambulatory Visit: Payer: Self-pay | Admitting: Gynecology

## 2013-11-08 DIAGNOSIS — Z1231 Encounter for screening mammogram for malignant neoplasm of breast: Secondary | ICD-10-CM

## 2013-11-14 ENCOUNTER — Other Ambulatory Visit (HOSPITAL_COMMUNITY)
Admission: RE | Admit: 2013-11-14 | Discharge: 2013-11-14 | Disposition: A | Discharge status: Home / Self Care | Payer: Medicare HMO | Source: Ambulatory Visit | Attending: Gynecology | Admitting: Gynecology

## 2013-11-14 ENCOUNTER — Encounter: Payer: Self-pay | Admitting: Gynecology

## 2013-11-14 ENCOUNTER — Ambulatory Visit (INDEPENDENT_AMBULATORY_CARE_PROVIDER_SITE_OTHER): Payer: Medicare HMO | Admitting: Gynecology

## 2013-11-14 ENCOUNTER — Telehealth: Payer: Self-pay | Admitting: *Deleted

## 2013-11-14 VITALS — BP 124/76 | Ht 68.0 in | Wt 189.0 lb

## 2013-11-14 DIAGNOSIS — M899 Disorder of bone, unspecified: Secondary | ICD-10-CM

## 2013-11-14 DIAGNOSIS — M949 Disorder of cartilage, unspecified: Secondary | ICD-10-CM

## 2013-11-14 DIAGNOSIS — Z124 Encounter for screening for malignant neoplasm of cervix: Secondary | ICD-10-CM | POA: Diagnosis present

## 2013-11-14 DIAGNOSIS — N952 Postmenopausal atrophic vaginitis: Secondary | ICD-10-CM

## 2013-11-14 DIAGNOSIS — IMO0002 Reserved for concepts with insufficient information to code with codable children: Secondary | ICD-10-CM

## 2013-11-14 DIAGNOSIS — M8580 Other specified disorders of bone density and structure, unspecified site: Secondary | ICD-10-CM

## 2013-11-14 DIAGNOSIS — N63 Unspecified lump in unspecified breast: Secondary | ICD-10-CM

## 2013-11-14 DIAGNOSIS — R6889 Other general symptoms and signs: Secondary | ICD-10-CM

## 2013-11-14 NOTE — Telephone Encounter (Signed)
Message copied by Thamas Jaegers on Thu Nov 14, 2013  2:58 PM ------      Message from: Tamara Larson      Created: Thu Nov 14, 2013  2:27 PM       Patient has routine mammogram scheduled Monday at the breast center. She needs a diagnostic mammogram with ultrasound reference new onset small nodule left breast 11:00 position 2 fingerbreadths off the areola ------

## 2013-11-14 NOTE — Patient Instructions (Signed)
Followup for bone density as scheduled. 

## 2013-11-14 NOTE — Telephone Encounter (Signed)
Orders placed breast center will contact to schedule.

## 2013-11-14 NOTE — Progress Notes (Signed)
Tamara Larson [birth year] [id number]        73 y.o.  G2P1011 for follow up exam  Past medical history,surgical history, problem list, medications, allergies, family history and social history were all reviewed and documented as reviewed in the EPIC chart.  ROS:  12 system ROS performed with pertinent positives and negatives included in the history, assessment and plan.   Additional significant findings :  none   Exam: Kim Counsellor Vitals:   11/14/13 1400  BP: 124/76  Height: 5\' 8"  (1.727 m)  Weight: 189 lb (85.73 kg)   General appearance:  Normal affect, orientation and appearance. Skin: Grossly normal HEENT: Without gross lesions.  No cervical or supraclavicular adenopathy. Thyroid normal.  Lungs:  Clear without wheezing, rales or rhonchi Cardiac: RR, without RMG Abdominal:  Soft, nontender, without masses, guarding, rebound, organomegaly or hernia Breasts:  Examined lying and sitting. Right without masses, retractions, discharge or axillary adenopathy.  Left with a small nodule over the skin 11:00 position 2 finger breaths out the areola. Freely mobile with no overlying skin changes. No nipple discharge, retractions or axillary adenopathy Physical Exam  Pulmonary/Chest:      Pelvic:  Ext/BUS/vagina with generalized atrophic changes.  Cervix with atrophic changes. Scarring from LEEP.  Pap done  Uterus anteverted, normal size, shape and contour, midline and mobile nontender   Adnexa  Without masses or tenderness    Anus and perineum  Normal   Rectovaginal  Normal sphincter tone without palpated masses or tenderness.    Assessment/Plan:  73 y.o. G41P1011 female for follow up exam.   1. Small nodule left breast patient just noted on self breast exam. Feels like probable small area of breast tissue. We'll start with diagnostic mammography and ultrasound and then triage based upon results. 2. Postmenopausal/atrophic genital changes. Patient without significant symptoms of  hot flushes, night sweats, vaginal dryness, dyspareunia. No vaginal bleeding. Continue to monitor and report any vaginal bleeding. 3. Pap smear 2012. Pap smear done today.  History of LEEP 2007 for CIN-1. 4. Osteopenia. Reported osteopenia with DEXA 2010. Recommend repeat DEXA now and patient will schedule. Increase calcium vitamin D reviewed. 5. Colonoscopy 2010. Repeat at their recommended interval. 6. Health maintenance. No routine blood work done and she is going to arrange to do this through her primary physician. Follow up for her mammogram/ultrasound otherwise annually.     Anastasio Auerbach MD, 2:22 PM 11/14/2013

## 2013-11-14 NOTE — Addendum Note (Signed)
Addended by: Nelva Nay on: 11/14/2013 02:30 PM   Modules accepted: Orders

## 2013-11-15 LAB — URINALYSIS W MICROSCOPIC + REFLEX CULTURE
Bilirubin Urine: NEGATIVE
CASTS: NONE SEEN
Crystals: NONE SEEN
Glucose, UA: NEGATIVE mg/dL
Hgb urine dipstick: NEGATIVE
Ketones, ur: NEGATIVE mg/dL
LEUKOCYTES UA: NEGATIVE
Nitrite: NEGATIVE
PH: 5 (ref 5.0–8.0)
PROTEIN: NEGATIVE mg/dL
SQUAMOUS EPITHELIAL / LPF: NONE SEEN
Specific Gravity, Urine: 1.015 (ref 1.005–1.030)
Urobilinogen, UA: 0.2 mg/dL (ref 0.0–1.0)

## 2013-11-18 NOTE — Telephone Encounter (Signed)
appointment 11/20/13 @ 1:30 pm

## 2013-11-19 LAB — CYTOLOGY - PAP

## 2013-11-20 ENCOUNTER — Ambulatory Visit
Admission: RE | Admit: 2013-11-20 | Discharge: 2013-11-20 | Disposition: A | Discharge status: Home / Self Care | Payer: Medicare HMO | Source: Ambulatory Visit | Attending: Gynecology | Admitting: Gynecology

## 2013-11-20 DIAGNOSIS — N63 Unspecified lump in unspecified breast: Secondary | ICD-10-CM

## 2013-11-25 ENCOUNTER — Ambulatory Visit (HOSPITAL_COMMUNITY): Payer: Medicare Other

## 2013-12-03 ENCOUNTER — Ambulatory Visit (INDEPENDENT_AMBULATORY_CARE_PROVIDER_SITE_OTHER): Payer: Medicare HMO

## 2013-12-03 ENCOUNTER — Other Ambulatory Visit: Payer: Self-pay | Admitting: Gynecology

## 2013-12-03 DIAGNOSIS — Z780 Asymptomatic menopausal state: Secondary | ICD-10-CM

## 2013-12-03 DIAGNOSIS — M8580 Other specified disorders of bone density and structure, unspecified site: Secondary | ICD-10-CM

## 2013-12-30 ENCOUNTER — Encounter: Payer: Self-pay | Admitting: Gynecology

## 2014-02-12 ENCOUNTER — Ambulatory Visit: Payer: Medicare HMO | Admitting: Internal Medicine

## 2014-03-11 ENCOUNTER — Ambulatory Visit: Payer: Medicare HMO | Admitting: Internal Medicine

## 2014-04-22 ENCOUNTER — Encounter: Payer: Self-pay | Admitting: Internal Medicine

## 2014-05-15 ENCOUNTER — Encounter: Payer: Self-pay | Admitting: Internal Medicine

## 2014-06-03 ENCOUNTER — Ambulatory Visit: Payer: Medicare HMO | Admitting: Internal Medicine

## 2014-06-06 ENCOUNTER — Ambulatory Visit (INDEPENDENT_AMBULATORY_CARE_PROVIDER_SITE_OTHER): Payer: Medicare HMO | Admitting: Internal Medicine

## 2014-06-06 ENCOUNTER — Encounter: Payer: Self-pay | Admitting: Internal Medicine

## 2014-06-06 VITALS — BP 134/86 | HR 62 | Temp 98.4°F | Resp 18 | Ht 67.52 in | Wt 193.0 lb

## 2014-06-06 DIAGNOSIS — E876 Hypokalemia: Secondary | ICD-10-CM | POA: Diagnosis not present

## 2014-06-06 DIAGNOSIS — I10 Essential (primary) hypertension: Secondary | ICD-10-CM | POA: Diagnosis not present

## 2014-06-06 DIAGNOSIS — M1990 Unspecified osteoarthritis, unspecified site: Secondary | ICD-10-CM

## 2014-06-06 DIAGNOSIS — E039 Hypothyroidism, unspecified: Secondary | ICD-10-CM | POA: Insufficient documentation

## 2014-06-06 DIAGNOSIS — E785 Hyperlipidemia, unspecified: Secondary | ICD-10-CM

## 2014-06-06 MED ORDER — AMLODIPINE BESYLATE 10 MG PO TABS: 10.0000 mg | ORAL_TABLET | Freq: Every day | ORAL | Status: DC

## 2014-06-06 MED ORDER — SIMVASTATIN 20 MG PO TABS: 20.0000 mg | ORAL_TABLET | Freq: Every evening | ORAL | Status: DC

## 2014-06-06 MED ORDER — LOSARTAN POTASSIUM 100 MG PO TABS: 100.0000 mg | ORAL_TABLET | Freq: Every day | ORAL | Status: DC

## 2014-06-06 MED ORDER — ETODOLAC 400 MG PO TABS: 400.0000 mg | ORAL_TABLET | Freq: Two times a day (BID) | ORAL | Status: DC | PRN

## 2014-06-06 MED ORDER — LEVOTHYROXINE SODIUM 150 MCG PO TABS: 150.0000 ug | ORAL_TABLET | Freq: Every day | ORAL | Status: DC

## 2014-06-06 NOTE — Addendum Note (Signed)
Addended by: Gildardo Cranker on: 06/06/2014 12:36 PM   Modules accepted: Orders
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
## 2014-06-06 NOTE — Patient Instructions (Signed)
Continue current medications as ordered  Get old records  Follow up in 1-2 mos for CPE w ECG

## 2014-06-07 LAB — CBC WITH DIFFERENTIAL/PLATELET
Basophils Absolute: 0.1 10*3/uL (ref 0.0–0.2)
Basos: 1 %
EOS ABS: 0.1 10*3/uL (ref 0.0–0.4)
Eos: 2 %
HCT: 37.2 % (ref 34.0–46.6)
HEMOGLOBIN: 12.4 g/dL (ref 11.1–15.9)
IMMATURE GRANS (ABS): 0 10*3/uL (ref 0.0–0.1)
IMMATURE GRANULOCYTES: 0 %
Lymphocytes Absolute: 2.4 10*3/uL (ref 0.7–3.1)
Lymphs: 30 %
MCH: 31.2 pg (ref 26.6–33.0)
MCHC: 33.3 g/dL (ref 31.5–35.7)
MCV: 94 fL (ref 79–97)
Monocytes Absolute: 0.5 10*3/uL (ref 0.1–0.9)
Monocytes: 6 %
Neutrophils Absolute: 4.9 10*3/uL (ref 1.4–7.0)
Neutrophils Relative %: 61 %
Platelets: 286 10*3/uL (ref 150–379)
RBC: 3.97 x10E6/uL (ref 3.77–5.28)
RDW: 13.9 % (ref 12.3–15.4)
WBC: 7.9 10*3/uL (ref 3.4–10.8)

## 2014-06-07 LAB — COMPREHENSIVE METABOLIC PANEL
A/G RATIO: 1.5 (ref 1.1–2.5)
ALK PHOS: 98 IU/L (ref 39–117)
ALT: 13 IU/L (ref 0–32)
AST: 15 IU/L (ref 0–40)
Albumin: 4.4 g/dL (ref 3.5–4.8)
BILIRUBIN TOTAL: 0.5 mg/dL (ref 0.0–1.2)
BUN/Creatinine Ratio: 14 (ref 11–26)
BUN: 12 mg/dL (ref 8–27)
CO2: 24 mmol/L (ref 18–29)
CREATININE: 0.87 mg/dL (ref 0.57–1.00)
Calcium: 9.5 mg/dL (ref 8.7–10.3)
Chloride: 98 mmol/L (ref 97–108)
GFR calc non Af Amer: 67 mL/min/{1.73_m2} (ref >59)
GFR, EST AFRICAN AMERICAN: 77 mL/min/{1.73_m2} (ref >59)
GLOBULIN, TOTAL: 2.9 g/dL (ref 1.5–4.5)
Glucose: 83 mg/dL (ref 65–99)
Potassium: 4.1 mmol/L (ref 3.5–5.2)
Sodium: 140 mmol/L (ref 134–144)
TOTAL PROTEIN: 7.3 g/dL (ref 6.0–8.5)

## 2014-06-07 LAB — LIPID PANEL
Chol/HDL Ratio: 4.3 ratio units (ref 0.0–4.4)
Cholesterol, Total: 351 mg/dL — ABNORMAL HIGH (ref 100–199)
HDL: 81 mg/dL (ref >39)
LDL Calculated: 238 mg/dL — ABNORMAL HIGH (ref 0–99)
TRIGLYCERIDES: 160 mg/dL — AB (ref 0–149)
VLDL Cholesterol Cal: 32 mg/dL (ref 5–40)

## 2014-06-07 LAB — TSH: TSH: 72.71 u[IU]/mL — ABNORMAL HIGH (ref 0.450–4.500)

## 2014-06-07 LAB — T4, FREE: Free T4: 0.77 ng/dL — ABNORMAL LOW (ref 0.82–1.77)

## 2014-06-17 ENCOUNTER — Telehealth: Payer: Self-pay | Admitting: Internal Medicine

## 2014-06-17 ENCOUNTER — Other Ambulatory Visit: Payer: Self-pay

## 2014-06-17 DIAGNOSIS — E038 Other specified hypothyroidism: Secondary | ICD-10-CM

## 2014-06-17 MED ORDER — LEVOTHYROXINE SODIUM 175 MCG PO TABS: 175.0000 ug | ORAL_TABLET | Freq: Every day | ORAL | Status: DC

## 2014-07-23 ENCOUNTER — Encounter: Payer: Medicare HMO | Admitting: Internal Medicine

## 2014-08-29 ENCOUNTER — Encounter: Payer: Self-pay | Admitting: Internal Medicine

## 2014-08-29 ENCOUNTER — Ambulatory Visit (INDEPENDENT_AMBULATORY_CARE_PROVIDER_SITE_OTHER): Payer: Medicare HMO | Admitting: Internal Medicine

## 2014-08-29 VITALS — BP 122/80 | HR 66 | Temp 98.2°F | Resp 16 | Ht 67.0 in | Wt 186.4 lb

## 2014-08-29 DIAGNOSIS — E039 Hypothyroidism, unspecified: Secondary | ICD-10-CM

## 2014-08-29 DIAGNOSIS — E038 Other specified hypothyroidism: Secondary | ICD-10-CM

## 2014-08-29 DIAGNOSIS — E785 Hyperlipidemia, unspecified: Secondary | ICD-10-CM | POA: Diagnosis not present

## 2014-08-29 DIAGNOSIS — Z0000 Encounter for general adult medical examination without abnormal findings: Secondary | ICD-10-CM

## 2014-08-29 DIAGNOSIS — Z1211 Encounter for screening for malignant neoplasm of colon: Secondary | ICD-10-CM

## 2014-08-29 DIAGNOSIS — I10 Essential (primary) hypertension: Secondary | ICD-10-CM | POA: Diagnosis not present

## 2014-08-29 DIAGNOSIS — M1990 Unspecified osteoarthritis, unspecified site: Secondary | ICD-10-CM

## 2014-08-29 NOTE — Patient Instructions (Signed)
Encouraged her to exercise 30-45 minutes 4-5 times per week. Eat a well balanced diet. Avoid smoking. Limit alcohol intake. Wear seatbelt when riding in the car. Wear sun block (SPF >50) when spending extended times outside.  Continue current medications as ordered  Follow up in 4 mos for routine visit

## 2014-08-29 NOTE — Progress Notes (Addendum)
Failed clock drawing  
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
## 2014-08-29 NOTE — Progress Notes (Signed)
Failed clock drawing  

## 2014-08-30 LAB — T4, FREE: Free T4: 2.02 ng/dL — ABNORMAL HIGH (ref 0.82–1.77)

## 2014-08-30 LAB — TSH: TSH: 0.026 u[IU]/mL — AB (ref 0.450–4.500)

## 2014-09-03 ENCOUNTER — Encounter: Payer: Self-pay | Admitting: Internal Medicine

## 2014-09-18 DIAGNOSIS — E039 Hypothyroidism, unspecified: Secondary | ICD-10-CM

## 2014-10-04 NOTE — Telephone Encounter (Signed)
error 

## 2014-10-22 ENCOUNTER — Ambulatory Visit (INDEPENDENT_AMBULATORY_CARE_PROVIDER_SITE_OTHER): Payer: Medicare HMO | Admitting: Women's Health

## 2014-10-22 ENCOUNTER — Encounter: Payer: Self-pay | Admitting: Women's Health

## 2014-10-22 VITALS — BP 126/80 | Ht 67.0 in | Wt 186.0 lb

## 2014-10-22 DIAGNOSIS — N3001 Acute cystitis with hematuria: Secondary | ICD-10-CM | POA: Diagnosis not present

## 2014-10-22 DIAGNOSIS — R350 Frequency of micturition: Secondary | ICD-10-CM | POA: Diagnosis not present

## 2014-10-22 LAB — URINALYSIS W MICROSCOPIC + REFLEX CULTURE
Bilirubin Urine: NEGATIVE
Crystals: NONE SEEN [HPF]
GLUCOSE, UA: NEGATIVE
NITRITE: NEGATIVE
PH: 5.5 (ref 5.0–8.0)
SPECIFIC GRAVITY, URINE: 1.02 (ref 1.001–1.035)
YEAST: NONE SEEN [HPF]

## 2014-10-22 MED ORDER — CIPROFLOXACIN HCL 250 MG PO TABS
250.0000 mg | ORAL_TABLET | Freq: Two times a day (BID) | ORAL | Status: DC
Start: 2014-10-22 — End: 2014-10-30

## 2014-10-22 NOTE — Patient Instructions (Signed)

## 2014-10-22 NOTE — Progress Notes (Signed)
Patient ID: Tamara Larson, female   DOB: 1940/08/25, 74 y.o.   MRN: [id number]  Presents with complaint of painless hematuria that began yesterday. States that after urinating yesterday upon wiping she noted some blood on the tissue paper. With her next urination was a light pink.  Endorses a mild odor with urination. Denies any vaginal bleeding, vaginal discharge, rectal bleeding, urinary frequency, dysuria, or urinary urgency. No history of nephrolithiasis or hemorrhoids. She does complain of some mild left lower quadrant abdominal cramping but denies any nausea, vomiting, or diarrhea. Scheduled for cataract surgery tomorrow.   Exam: Well appearing .  Abdomen soft and nontender to palpation throughout. No suprapubic tenderness noted.  No blood, erythema, lesions, or exudate noted . Cervix without blood , lesions or exudate. No odor or visible hemorrhoids, erythema, lesions noted.  No wet prep done due to normality of exam . Appeared younger than stated age. UA: +3 blood +1 leukocytes, +1 ketones,  20-40 WBCs, 20-40 RBCs, many bacteria  UTI with Hematuria  Plan: 250 mg of ciprofloxacin bid x 3 days. Encouraged increase water intake and discouraged intake of sugary beverages, caffeinated beverages, or alcohol. Encouraged to complete all antibiotics as directed.   Will follow up in 2 weeks for repeat urinalysis to ensure resolution of UTI as well as hematuria. Urine culture pending. Call if no relief of symptoms.

## 2014-10-25 LAB — URINE CULTURE

## 2014-10-27 ENCOUNTER — Other Ambulatory Visit: Payer: Self-pay | Admitting: Women's Health

## 2014-10-27 DIAGNOSIS — N3001 Acute cystitis with hematuria: Secondary | ICD-10-CM

## 2014-10-30 ENCOUNTER — Other Ambulatory Visit: Payer: Medicare HMO

## 2014-10-30 ENCOUNTER — Ambulatory Visit (AMBULATORY_SURGERY_CENTER): Payer: Self-pay

## 2014-10-30 VITALS — Ht 67.0 in | Wt 187.0 lb

## 2014-10-30 DIAGNOSIS — N3001 Acute cystitis with hematuria: Secondary | ICD-10-CM

## 2014-10-30 DIAGNOSIS — Z1211 Encounter for screening for malignant neoplasm of colon: Secondary | ICD-10-CM

## 2014-10-30 NOTE — Progress Notes (Signed)
No allergies to eggs or soy No diet/weight loss meds No home oxygen No past problems with anesthesia  Refused emmi 

## 2014-10-31 LAB — URINALYSIS W MICROSCOPIC + REFLEX CULTURE
BILIRUBIN URINE: NEGATIVE
Bacteria, UA: NONE SEEN [HPF]
GLUCOSE, UA: NEGATIVE
HGB URINE DIPSTICK: NEGATIVE
NITRITE: NEGATIVE
PH: 6 (ref 5.0–8.0)
SPECIFIC GRAVITY, URINE: 1.025 (ref 1.001–1.035)
Yeast: NONE SEEN [HPF]

## 2014-11-01 LAB — URINE CULTURE
Colony Count: NO GROWTH
Organism ID, Bacteria: NO GROWTH

## 2014-11-07 ENCOUNTER — Telehealth: Payer: Self-pay | Admitting: Gastroenterology

## 2014-11-07 NOTE — Telephone Encounter (Signed)
Received records from [facility] forwarded 5 pages to Dr. Havery Moros 11/07/14 fbg.

## 2014-11-12 ENCOUNTER — Encounter: Payer: Medicare HMO | Admitting: Internal Medicine

## 2014-11-14 ENCOUNTER — Telehealth: Payer: Self-pay | Admitting: Gastroenterology

## 2014-11-17 MED ORDER — POLYETHYLENE GLYCOL 3350 17 GM/SCOOP PO POWD: 17.0000 g | Freq: Every day | ORAL | Status: DC

## 2014-11-17 NOTE — Telephone Encounter (Signed)
Dr Tamara Larson- Patient requests we send rx for miralax so she can get it cheaper. Has upcoming appt with you for colonoscopy 11/18/14. May I give rx for miralax?

## 2014-11-17 NOTE — Telephone Encounter (Signed)
Rx sent 

## 2014-11-17 NOTE — Telephone Encounter (Signed)
Yes that is fine, we can provide prescription for miralax. Thanks

## 2014-11-18 ENCOUNTER — Ambulatory Visit (AMBULATORY_SURGERY_CENTER): Payer: Medicare HMO | Admitting: Gastroenterology

## 2014-11-18 ENCOUNTER — Encounter: Payer: Self-pay | Admitting: Gastroenterology

## 2014-11-18 VITALS — BP 129/81 | HR 67 | Temp 96.6°F | Resp 21 | Ht 67.0 in | Wt 187.0 lb

## 2014-11-18 DIAGNOSIS — D123 Benign neoplasm of transverse colon: Secondary | ICD-10-CM | POA: Diagnosis not present

## 2014-11-18 DIAGNOSIS — Z1211 Encounter for screening for malignant neoplasm of colon: Secondary | ICD-10-CM | POA: Diagnosis not present

## 2014-11-18 DIAGNOSIS — D120 Benign neoplasm of cecum: Secondary | ICD-10-CM | POA: Diagnosis not present

## 2014-11-18 DIAGNOSIS — D125 Benign neoplasm of sigmoid colon: Secondary | ICD-10-CM

## 2014-11-18 DIAGNOSIS — D122 Benign neoplasm of ascending colon: Secondary | ICD-10-CM

## 2014-11-18 MED ORDER — SODIUM CHLORIDE 0.9 % IV SOLN: 500.0000 mL | INTRAVENOUS | Status: DC

## 2014-11-18 NOTE — Op Note (Signed)
[facility]  [address]. [place], [id number]   COLONOSCOPY PROCEDURE REPORT  PATIENT: Tamara Larson, Tamara Larson  MR#: [id number] BIRTHDATE: 11/16/1940 , 10  yrs. old GENDER: female ENDOSCOPIST: Dawson Cellar, MD REFERRED GY:IRSWNI Carter, MD PROCEDURE DATE:  11/18/2014 PROCEDURE:   Colonoscopy with snare polypectomy and Colonoscopy with biopsy First Screening Colonoscopy - Avg.  risk and is 50 yrs.  old or older - No.  Prior Negative Screening - Now for repeat screening. 10 or more years since last screening  History of Adenoma - Now for follow-up colonoscopy & has been > or = to 3 yrs.  N/A  Polyps removed today? Yes ASA CLASS:   Class II INDICATIONS:Screening for colonic neoplasia. MEDICATIONS: Propofol 325 mg IV  DESCRIPTION OF PROCEDURE:   After the risks benefits and alternatives of the procedure were thoroughly explained, informed consent was obtained.  The digital rectal exam revealed no abnormalities of the rectum.   The LB [id number]  endoscope was introduced through the anus and advanced to the cecum, which was identified by both the appendix and ileocecal valve. No adverse events experienced.   The quality of the prep was adequate  The instrument was then slowly withdrawn as the colon was fully examined. Estimated blood loss is zero unless otherwise noted in this procedure report.      COLON FINDINGS: A flat polyp measuring 6 mm in size was found at the cecum.  A polypectomy was performed with a cold snare.  The resection was complete, the polyp tissue was completely retrieved and sent to histology. The snare did not completely cut through the tissue at first pass, resulting in a central area of the polyp site in which a blood vessel was visualized. No significant bleeding was noted at the site, however  the wound at the site was prophylactically closed by placing hemoclips.  One (1) placement was made.  One (1) placement was made.   A sessile  polyp measuring 3 mm in size was found at the ileocecal valve.  A polypectomy was performed with cold forceps.  The resection was complete, the polyp tissue was completely retrieved and sent to histology.   Two sessile polyps measuring 3 mm in size were found in the ascending colon.  Polypectomies were performed with cold forceps.  The resection was complete, the polyp tissue was completely retrieved and sent to histology.   A sessile polyp measuring 4 mm in size was found at the splenic flexure.  A polypectomy was performed with cold forceps.  The resection was complete, the polyp tissue was completely retrieved and sent to histology.   A sessile polyp measuring 3 mm in size was found in the sigmoid colon.  A polypectomy was performed with cold forceps.  The resection was complete, the polyp tissue was completely retrieved and sent to histology.   There was moderate diverticulosis noted in the ascending colon, descending colon, and sigmoid colon.   The examination was otherwise normal.  Retroflexed views revealed internal hemorrhoids. The time to cecum = 4.0 Withdrawal time = 24.9   The scope was withdrawn and the procedure completed. COMPLICATIONS: There were no immediate complications.  ENDOSCOPIC IMPRESSION: 1.   Flat polyp was found at the cecum; polypectomy was performed with a cold snare; the wound at the site was closed by placing hemoclips x 1 2.   Sessile polyp was found at the ileocecal valve; polypectomy was performed with cold forceps 3.   Two sessile polyps were  found in the ascending colon; polypectomies were performed with cold forceps 4.   Sessile polyp was found at the splenic flexure; polypectomy was performed with cold forceps 5.   Sessile polyp was found in the sigmoid colon; polypectomy was performed with cold forceps 6.   Moderate diverticulosis was noted in the ascending colon, descending colon, and sigmoid colon 7.   The examination was otherwise  normal  RECOMMENDATIONS: Resume diet Resume medictations No aspirin or NSAIDs for 2 weeks following polypectomy Await pathology results.  Further recommendations pending these results.  eSigned:  Adrian Cellar, MD 11/18/2014 10:35 AM   cc:   PATIENT NAME:  Tamara Larson, Tamara Larson MR#: [id number]

## 2014-11-18 NOTE — Progress Notes (Signed)
Called to room to assist during endoscopic procedure.  Patient ID and intended procedure confirmed with present staff. Received instructions for my participation in the procedure from the performing physician.  

## 2014-11-18 NOTE — Patient Instructions (Signed)
YOU HAD AN ENDOSCOPIC PROCEDURE TODAY AT [facility]:   Refer to the procedure report that was given to you for any specific questions about what was found during the examination.  If the procedure report does not answer your questions, please call your gastroenterologist to clarify.  If you requested that your care partner not be given the details of your procedure findings, then the procedure report has been included in a sealed envelope for you to review at your convenience later.  YOU SHOULD EXPECT: Some feelings of bloating in the abdomen. Passage of more gas than usual.  Walking can help get rid of the air that was put into your GI tract during the procedure and reduce the bloating. If you had a lower endoscopy (such as a colonoscopy or flexible sigmoidoscopy) you may notice spotting of blood in your stool or on the toilet paper. If you underwent a bowel prep for your procedure, you may not have a normal bowel movement for a few days.  Please Note:  You might notice some irritation and congestion in your nose or some drainage.  This is from the oxygen used during your procedure.  There is no need for concern and it should clear up in a day or so.  SYMPTOMS TO REPORT IMMEDIATELY:   Following lower endoscopy (colonoscopy or flexible sigmoidoscopy):  Excessive amounts of blood in the stool  Significant tenderness or worsening of abdominal pains  Swelling of the abdomen that is new, acute  Fever of 100F or higher  For urgent or emergent issues, a gastroenterologist can be reached at any hour by calling [phone].   DIET: Your first meal following the procedure should be a small meal and then it is ok to progress to your normal diet. Heavy or fried foods are harder to digest and may make you feel nauseous or bloated.  Likewise, meals heavy in dairy and vegetables can increase bloating.  Drink plenty of fluids but you should avoid alcoholic beverages for 24  hours.  ACTIVITY:  You should plan to take it easy for the rest of today and you should NOT DRIVE or use heavy machinery until tomorrow (because of the sedation medicines used during the test).    FOLLOW UP: Our staff will call the number listed on your records the next business day following your procedure to check on you and address any questions or concerns that you may have regarding the information given to you following your procedure. If we do not reach you, we will leave a message.  However, if you are feeling well and you are not experiencing any problems, there is no need to return our call.  We will assume that you have returned to your regular daily activities without incident.  If any biopsies were taken you will be contacted by phone or by letter within the next 1-3 weeks.  Please call us at [phone] if you have not heard about the biopsies in 3 weeks.    SIGNATURES/CONFIDENTIALITY: You and/or your care partner have signed paperwork which will be entered into your electronic medical record.  These signatures attest to the fact that that the information above on your After Visit Summary has been reviewed and is understood.  Full responsibility of the confidentiality of this discharge information lies with you and/or your care-partner.  Polyp, diverticulosis information given.  No non steroidal anti inflammatory medications for two weeks.

## 2014-11-18 NOTE — Progress Notes (Signed)
Transferred to recovery room. A/O x3, pleased with MAC.  VSS.  Report to Jane, RN. 

## 2014-11-19 ENCOUNTER — Telehealth: Payer: Self-pay | Admitting: *Deleted

## 2014-11-19 NOTE — Telephone Encounter (Signed)
  Follow up Call-  Call back number 11/18/2014  Post procedure Call Back phone  # [phone]  Permission to leave phone message No     Patient questions:  Do you have a fever, pain , or abdominal swelling? No. Pain Score  0 *  Have you tolerated food without any problems? Yes.    Have you been able to return to your normal activities? Yes.    Do you have any questions about your discharge instructions: Diet   No. Medications  No. Follow up visit  No.  Do you have questions or concerns about your Care? No.  Actions: * If pain score is 4 or above: No action needed, pain <4.

## 2014-11-25 ENCOUNTER — Encounter: Payer: Self-pay | Admitting: Gastroenterology

## 2014-12-01 ENCOUNTER — Other Ambulatory Visit: Payer: Self-pay

## 2014-12-01 DIAGNOSIS — Z803 Family history of malignant neoplasm of breast: Secondary | ICD-10-CM

## 2014-12-01 DIAGNOSIS — Z1231 Encounter for screening mammogram for malignant neoplasm of breast: Secondary | ICD-10-CM

## 2014-12-22 ENCOUNTER — Ambulatory Visit: Payer: Medicare HMO

## 2014-12-24 ENCOUNTER — Encounter: Payer: Self-pay | Admitting: Gynecology

## 2014-12-24 ENCOUNTER — Ambulatory Visit (INDEPENDENT_AMBULATORY_CARE_PROVIDER_SITE_OTHER): Payer: Medicare HMO | Admitting: Gynecology

## 2014-12-24 VITALS — BP 124/74 | Ht 67.0 in | Wt 187.0 lb

## 2014-12-24 DIAGNOSIS — N952 Postmenopausal atrophic vaginitis: Secondary | ICD-10-CM | POA: Diagnosis not present

## 2014-12-24 DIAGNOSIS — Z01419 Encounter for gynecological examination (general) (routine) without abnormal findings: Secondary | ICD-10-CM

## 2014-12-24 DIAGNOSIS — N390 Urinary tract infection, site not specified: Secondary | ICD-10-CM

## 2014-12-24 NOTE — Patient Instructions (Signed)

## 2014-12-24 NOTE — Progress Notes (Signed)
Tamara Larson 10/01/1940 [id number]        73 y.o.  G2P1011 for breast and pelvic exam.  Past medical history,surgical history, problem list, medications, allergies, family history and social history were all reviewed and documented as reviewed in the EPIC chart.  ROS:  Performed with pertinent positives and negatives included in the history, assessment and plan.   Additional significant findings :  none   Exam: Kim Counsellor Vitals:   12/24/14 1114  BP: 124/74  Height: 5\' 7"  (1.702 m)  Weight: 187 lb (84.823 kg)   General appearance:  Normal affect, orientation and appearance. Skin: Grossly normal HEENT: Without gross lesions.  No cervical or supraclavicular adenopathy. Thyroid normal.  Lungs:  Clear without wheezing, rales or rhonchi Cardiac: RR, without RMG Abdominal:  Soft, nontender, without masses, guarding, rebound, organomegaly or hernia Breasts:  Examined lying and sitting without masses, retractions, discharge or axillary adenopathy. Pelvic:  Ext/BUS/vagina with atrophic changes  Cervix with atrophic changes  Uterus anteverted, normal size, shape and contour, midline and mobile nontender   Adnexa  Without masses or tenderness    Anus and perineum  Normal   Rectovaginal  Normal sphincter tone without palpated masses or tenderness.    Assessment/Plan:  74 y.o. G37P1011 female for annual exam.   1. Postmenopausal/atrophic genital changes. Reviewed symptomatology with her. She is not having any significant hot flushes, night sweats, vaginal dryness. No vaginal bleeding. Continue to monitor and report any vaginal bleeding. 2. History of recent UTI treatment with cystitis in August. Symptoms have resolved and she is not having any issues at this point. Check baseline urinalysis. 3. Pap smear 2015 negative. No Pap smear done today. History of LEEP for CIN-1 in the past. 4. History of osteopenia historically but most recent DEXA 2015 normal. Plan repeat DEXA at 5 year  interval. Increased calcium vitamin D reviewed. 5. Colonoscopy 2016. Repeat at their recommended interval. 6. Mammography scheduled and she'll follow up for this.  SBE monthly reviewed. Had an area of fat necrosis of the left breast last year although no residual palpable abnormalities this year. 7. Health maintenance. No routine blood work done as she has this at her primary physician's office. Follow up 1 year, sooner as needed.   Anastasio Auerbach MD, 11:45 AM 12/24/2014

## 2014-12-25 LAB — URINALYSIS W MICROSCOPIC + REFLEX CULTURE
Bacteria, UA: NONE SEEN [HPF]
Bilirubin Urine: NEGATIVE
CASTS: NONE SEEN [LPF]
CRYSTALS: NONE SEEN [HPF]
Glucose, UA: NEGATIVE
HGB URINE DIPSTICK: NEGATIVE
Nitrite: NEGATIVE
RBC / HPF: NONE SEEN RBC/HPF (ref <=2)
Specific Gravity, Urine: 1.016 (ref 1.001–1.035)
Yeast: NONE SEEN [HPF]
pH: 6.5 (ref 5.0–8.0)

## 2014-12-26 LAB — URINE CULTURE: Colony Count: >=100000

## 2014-12-30 ENCOUNTER — Other Ambulatory Visit: Payer: Medicare HMO

## 2015-01-02 ENCOUNTER — Ambulatory Visit: Payer: Medicare HMO | Admitting: Internal Medicine

## 2015-01-07 ENCOUNTER — Ambulatory Visit: Payer: Medicare HMO | Admitting: Internal Medicine

## 2015-01-14 ENCOUNTER — Ambulatory Visit
Admission: RE | Admit: 2015-01-14 | Discharge: 2015-01-14 | Disposition: A | Discharge status: Home / Self Care | Payer: Medicare HMO | Source: Ambulatory Visit

## 2015-01-14 DIAGNOSIS — Z1231 Encounter for screening mammogram for malignant neoplasm of breast: Secondary | ICD-10-CM

## 2015-01-14 DIAGNOSIS — Z803 Family history of malignant neoplasm of breast: Secondary | ICD-10-CM

## 2015-01-28 ENCOUNTER — Other Ambulatory Visit: Payer: Medicare HMO

## 2015-01-28 DIAGNOSIS — I10 Essential (primary) hypertension: Secondary | ICD-10-CM

## 2015-01-28 DIAGNOSIS — E039 Hypothyroidism, unspecified: Secondary | ICD-10-CM

## 2015-01-28 DIAGNOSIS — E785 Hyperlipidemia, unspecified: Secondary | ICD-10-CM

## 2015-01-29 LAB — LIPID PANEL
CHOLESTEROL TOTAL: 194 mg/dL (ref 100–199)
Chol/HDL Ratio: 2.8 ratio units (ref 0.0–4.4)
HDL: 70 mg/dL (ref >39)
LDL CALC: 105 mg/dL — AB (ref 0–99)
Triglycerides: 94 mg/dL (ref 0–149)
VLDL CHOLESTEROL CAL: 19 mg/dL (ref 5–40)

## 2015-01-29 LAB — COMPREHENSIVE METABOLIC PANEL
ALT: 13 IU/L (ref 0–32)
AST: 13 IU/L (ref 0–40)
Albumin/Globulin Ratio: 1.6 (ref 1.1–2.5)
Albumin: 4 g/dL (ref 3.5–4.8)
Alkaline Phosphatase: 109 IU/L (ref 39–117)
BUN / CREAT RATIO: 18 (ref 11–26)
BUN: 14 mg/dL (ref 8–27)
Bilirubin Total: 0.6 mg/dL (ref 0.0–1.2)
CO2: 23 mmol/L (ref 18–29)
CREATININE: 0.8 mg/dL (ref 0.57–1.00)
Calcium: 9.1 mg/dL (ref 8.7–10.3)
Chloride: 106 mmol/L (ref 97–106)
GFR calc non Af Amer: 73 mL/min/{1.73_m2} (ref >59)
GFR, EST AFRICAN AMERICAN: 85 mL/min/{1.73_m2} (ref >59)
GLUCOSE: 87 mg/dL (ref 65–99)
Globulin, Total: 2.5 g/dL (ref 1.5–4.5)
Potassium: 3.8 mmol/L (ref 3.5–5.2)
Sodium: 143 mmol/L (ref 136–144)
Total Protein: 6.5 g/dL (ref 6.0–8.5)

## 2015-01-29 LAB — TSH: TSH: 0.017 u[IU]/mL — AB (ref 0.450–4.500)

## 2015-01-30 ENCOUNTER — Ambulatory Visit (INDEPENDENT_AMBULATORY_CARE_PROVIDER_SITE_OTHER): Payer: Medicare HMO | Admitting: Internal Medicine

## 2015-01-30 ENCOUNTER — Encounter: Payer: Self-pay | Admitting: Internal Medicine

## 2015-01-30 VITALS — BP 118/80 | HR 71 | Temp 98.1°F | Resp 20 | Wt 188.6 lb

## 2015-01-30 DIAGNOSIS — E038 Other specified hypothyroidism: Secondary | ICD-10-CM | POA: Diagnosis not present

## 2015-01-30 DIAGNOSIS — E785 Hyperlipidemia, unspecified: Secondary | ICD-10-CM

## 2015-01-30 DIAGNOSIS — M1990 Unspecified osteoarthritis, unspecified site: Secondary | ICD-10-CM | POA: Diagnosis not present

## 2015-01-30 DIAGNOSIS — I10 Essential (primary) hypertension: Secondary | ICD-10-CM | POA: Diagnosis not present

## 2015-01-30 DIAGNOSIS — E034 Atrophy of thyroid (acquired): Secondary | ICD-10-CM | POA: Diagnosis not present

## 2015-01-30 MED ORDER — LEVOTHYROXINE SODIUM 125 MCG PO TABS: 125.0000 ug | ORAL_TABLET | Freq: Every day | ORAL | Status: DC

## 2015-01-30 NOTE — Patient Instructions (Addendum)
Take thyroid medicine alone at least 1 hr before other medications and breakfast  Continue other medications as ordered  Check TSH in 6 weeks  Follow up in 4 mos for routine visit. Check fasting labs prior to appt
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
## 2015-03-18 ENCOUNTER — Other Ambulatory Visit: Payer: Medicare HMO

## 2015-04-23 ENCOUNTER — Telehealth: Payer: Self-pay

## 2015-04-23 NOTE — Telephone Encounter (Signed)
Pt should not wait until tomorrow. She needs eval today. Recommend she be seen at urgent care if no appts available today

## 2015-04-23 NOTE — Telephone Encounter (Signed)
Patient called c/o funny feeling in chest off/on (more on than off) x 2 weeks. Patient states "Im not in pain, it's just a funny feeling" patient denies blurred vision, light headedness, dizziness, or SOB. Patient said she feels like her heart is racing yet she cannot confirm this for she does not have a B/P cuff to check B/P or pulse. Patient said she went to the ER with her friend about a week ago and waited 6.5 hours and was not seen for the wait was too long. Patient was instructed that she needs to be evaluated at the ER to r/o heart related issues. Patient refused. Patient scheduled appointment with Dr.Carter tomorrow.  Please review and advise

## 2015-04-24 ENCOUNTER — Encounter: Payer: Self-pay | Admitting: Internal Medicine

## 2015-04-24 ENCOUNTER — Ambulatory Visit (INDEPENDENT_AMBULATORY_CARE_PROVIDER_SITE_OTHER): Payer: Medicare HMO | Admitting: Internal Medicine

## 2015-04-24 VITALS — BP 118/86 | HR 74 | Temp 98.0°F | Resp 20 | Ht 67.0 in | Wt 188.2 lb

## 2015-04-24 DIAGNOSIS — R002 Palpitations: Secondary | ICD-10-CM | POA: Diagnosis not present

## 2015-04-24 DIAGNOSIS — E038 Other specified hypothyroidism: Secondary | ICD-10-CM | POA: Diagnosis not present

## 2015-04-24 DIAGNOSIS — E034 Atrophy of thyroid (acquired): Secondary | ICD-10-CM | POA: Diagnosis not present

## 2015-04-24 DIAGNOSIS — R0789 Other chest pain: Secondary | ICD-10-CM

## 2015-04-24 MED ORDER — LEVOTHYROXINE SODIUM 100 MCG PO TABS: 100.0000 ug | ORAL_TABLET | Freq: Every day | ORAL | Status: DC

## 2015-04-24 NOTE — Patient Instructions (Signed)
Reduce levothyroxine 113mcg daily  Return in 6 weeks for thyroid labs  Return if symptoms do not improve.   Follow up as scheduled.
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
## 2015-04-24 NOTE — Telephone Encounter (Signed)
Spoke with patient, patient states she was without symptoms after phone call yesterday. Patient will still come in for appointment today.

## 2015-04-25 LAB — TSH: TSH: 0.021 u[IU]/mL — AB (ref 0.450–4.500)

## 2015-04-25 LAB — T4, FREE: FREE T4: 1.77 ng/dL (ref 0.82–1.77)

## 2015-06-03 ENCOUNTER — Other Ambulatory Visit: Payer: Self-pay | Admitting: *Deleted

## 2015-06-03 ENCOUNTER — Other Ambulatory Visit: Payer: Self-pay

## 2015-06-03 ENCOUNTER — Other Ambulatory Visit: Payer: Medicare HMO

## 2015-06-03 DIAGNOSIS — I10 Essential (primary) hypertension: Secondary | ICD-10-CM | POA: Diagnosis not present

## 2015-06-03 DIAGNOSIS — E079 Disorder of thyroid, unspecified: Secondary | ICD-10-CM

## 2015-06-03 DIAGNOSIS — E039 Hypothyroidism, unspecified: Secondary | ICD-10-CM

## 2015-06-03 DIAGNOSIS — E785 Hyperlipidemia, unspecified: Secondary | ICD-10-CM

## 2015-06-03 DIAGNOSIS — N3001 Acute cystitis with hematuria: Secondary | ICD-10-CM

## 2015-06-03 NOTE — Addendum Note (Signed)
Addended by: Leonie Green on: 06/03/2015 10:19 AM   Modules accepted: Orders

## 2015-06-04 LAB — BASIC METABOLIC PANEL
BUN/Creatinine Ratio: 15 (ref 12–28)
BUN: 13 mg/dL (ref 8–27)
CALCIUM: 9.5 mg/dL (ref 8.7–10.3)
CO2: 22 mmol/L (ref 18–29)
CREATININE: 0.88 mg/dL (ref 0.57–1.00)
Chloride: 102 mmol/L (ref 96–106)
GFR calc Af Amer: 75 mL/min/{1.73_m2} (ref >59)
GFR, EST NON AFRICAN AMERICAN: 65 mL/min/{1.73_m2} (ref >59)
Glucose: 93 mg/dL (ref 65–99)
Potassium: 3.7 mmol/L (ref 3.5–5.2)
Sodium: 143 mmol/L (ref 134–144)

## 2015-06-04 LAB — LIPID PANEL
CHOLESTEROL TOTAL: 215 mg/dL — AB (ref 100–199)
Chol/HDL Ratio: 2.7 ratio units (ref 0.0–4.4)
HDL: 81 mg/dL (ref >39)
LDL CALC: 116 mg/dL — AB (ref 0–99)
TRIGLYCERIDES: 88 mg/dL (ref 0–149)
VLDL Cholesterol Cal: 18 mg/dL (ref 5–40)

## 2015-06-04 LAB — ALT: ALT: 12 IU/L (ref 0–32)

## 2015-06-04 LAB — T4, FREE: Free T4: 1.39 ng/dL (ref 0.82–1.77)

## 2015-06-04 LAB — TSH: TSH: 0.13 u[IU]/mL — AB (ref 0.450–4.500)

## 2015-06-05 ENCOUNTER — Encounter: Payer: Self-pay | Admitting: Internal Medicine

## 2015-06-05 ENCOUNTER — Ambulatory Visit (INDEPENDENT_AMBULATORY_CARE_PROVIDER_SITE_OTHER): Payer: Medicare HMO | Admitting: Internal Medicine

## 2015-06-05 VITALS — BP 130/80 | HR 74 | Temp 98.4°F | Resp 20 | Ht 67.0 in | Wt 190.0 lb

## 2015-06-05 DIAGNOSIS — E034 Atrophy of thyroid (acquired): Secondary | ICD-10-CM

## 2015-06-05 DIAGNOSIS — I10 Essential (primary) hypertension: Secondary | ICD-10-CM | POA: Diagnosis not present

## 2015-06-05 DIAGNOSIS — E038 Other specified hypothyroidism: Secondary | ICD-10-CM | POA: Diagnosis not present

## 2015-06-05 DIAGNOSIS — R002 Palpitations: Secondary | ICD-10-CM | POA: Diagnosis not present

## 2015-06-05 DIAGNOSIS — E785 Hyperlipidemia, unspecified: Secondary | ICD-10-CM

## 2015-06-05 DIAGNOSIS — M1990 Unspecified osteoarthritis, unspecified site: Secondary | ICD-10-CM | POA: Diagnosis not present
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
## 2015-06-05 NOTE — Patient Instructions (Addendum)
Increase cholesterol medicine to 4 times per week  Continue other medications as ordered  Follow up in 4 mos for CPE. Fasting labs prior to visit

## 2015-07-15 ENCOUNTER — Other Ambulatory Visit: Payer: Self-pay | Admitting: Internal Medicine

## 2015-08-23 ENCOUNTER — Other Ambulatory Visit: Payer: Self-pay | Admitting: Internal Medicine

## 2015-08-28 ENCOUNTER — Other Ambulatory Visit: Payer: Self-pay | Admitting: Internal Medicine

## 2015-09-28 ENCOUNTER — Encounter: Payer: Self-pay | Admitting: Gynecology

## 2015-09-28 ENCOUNTER — Other Ambulatory Visit: Payer: Self-pay | Admitting: Gynecology

## 2015-09-28 ENCOUNTER — Ambulatory Visit (INDEPENDENT_AMBULATORY_CARE_PROVIDER_SITE_OTHER): Payer: Medicare HMO | Admitting: Gynecology

## 2015-09-28 VITALS — BP 124/82

## 2015-09-28 DIAGNOSIS — N3001 Acute cystitis with hematuria: Secondary | ICD-10-CM | POA: Diagnosis not present

## 2015-09-28 DIAGNOSIS — R300 Dysuria: Secondary | ICD-10-CM | POA: Diagnosis not present

## 2015-09-28 LAB — URINALYSIS W MICROSCOPIC + REFLEX CULTURE
CASTS: NONE SEEN [LPF]
CRYSTALS: NONE SEEN [HPF]
YEAST: NONE SEEN [HPF]

## 2015-09-28 MED ORDER — NITROFURANTOIN MONOHYD MACRO 100 MG PO CAPS: 100.0000 mg | ORAL_CAPSULE | Freq: Two times a day (BID) | ORAL | 0 refills | Status: DC

## 2015-09-28 NOTE — Patient Instructions (Signed)
Take the antibiotics twice daily for 7 days. Follow up if your symptoms persist, worsen or recur.

## 2015-09-28 NOTE — Progress Notes (Signed)
    Tamara Larson Nov 11, 1940 [id number]        74 y.o.  [id number] presents with 2 days of suprapubic discomfort. Note did blood-tinged urine 2 days ago. No real frequency or urgency. No low back pain, fever or chills. No nausea, vomiting, diarrhea, constipation.  History of cystitis in the past.  Past medical history,surgical history, problem list, medications, allergies, family history and social history were all reviewed and documented in the EPIC chart.  Directed ROS with pertinent positives and negatives documented in the history of present illness/assessment and plan.  Exam: Vitals:   09/28/15 1100  BP: 124/82   General appearance:  Normal Spine straight without CVA tenderness Abdomen soft nontender without masses guarding rebound  Assessment/Plan:  75 y.o. G2P1011 with history and urinalysis consistent with UTI. Macrobid 100 mg twice a day 7 days. Follow up if symptoms persist, worsen or recur.    Anastasio Auerbach MD, 11:20 AM 09/28/2015

## 2015-09-30 LAB — URINE CULTURE: Colony Count: >=100000

## 2015-10-22 ENCOUNTER — Other Ambulatory Visit: Payer: Self-pay | Admitting: Internal Medicine

## 2015-11-09 ENCOUNTER — Ambulatory Visit (INDEPENDENT_AMBULATORY_CARE_PROVIDER_SITE_OTHER): Payer: Medicare HMO | Admitting: Gynecology

## 2015-11-09 ENCOUNTER — Encounter: Payer: Self-pay | Admitting: Gynecology

## 2015-11-09 VITALS — BP 124/78

## 2015-11-09 DIAGNOSIS — R319 Hematuria, unspecified: Secondary | ICD-10-CM | POA: Diagnosis not present

## 2015-11-09 DIAGNOSIS — N3001 Acute cystitis with hematuria: Secondary | ICD-10-CM

## 2015-11-09 LAB — URINALYSIS W MICROSCOPIC + REFLEX CULTURE
BILIRUBIN URINE: NEGATIVE
Crystals: NONE SEEN [HPF]
GLUCOSE, UA: NEGATIVE
Hgb urine dipstick: NEGATIVE
NITRITE: NEGATIVE
RBC / HPF: NONE SEEN RBC/HPF (ref <=2)
SPECIFIC GRAVITY, URINE: 1.02 (ref 1.001–1.035)
Yeast: NONE SEEN [HPF]
pH: 6 (ref 5.0–8.0)

## 2015-11-09 MED ORDER — CIPROFLOXACIN HCL 250 MG PO TABS: 250.0000 mg | ORAL_TABLET | Freq: Two times a day (BID) | ORAL | 0 refills | Status: DC

## 2015-11-09 NOTE — Patient Instructions (Signed)
Take the antibiotic pill twice daily for 7 days. Follow up if your symptoms persist, worsen or recur. 

## 2015-11-09 NOTE — Progress Notes (Signed)
    Katira Bacak Lapaglia [birth year] [id number]        75 y.o.  [id number] presents with several days of suprapubic pressure and end stream dysuria. No fever or chills. No urgency low back pain nausea vomiting diarrhea constipation. Was treated for Escherichia coli UTI 09/28/2015 with Macrobid which it did show sensitivity to. Notes that all of her symptoms from their cleared but now recurred the last several days. She does admit to not drinking much water daily.  Past medical history,surgical history, problem list, medications, allergies, family history and social history were all reviewed and documented in the EPIC chart.  Directed ROS with pertinent positives and negatives documented in the history of present illness/assessment and plan.  Exam:  Vitals:   11/09/15 1157  BP: 124/78   General appearance:  Normal Spine straight without CVA tenderness. Abdomen soft nontender without masses guarding rebound  Assessment/Plan:  75 y.o. G2P1011 with history consistent with recurrent UTI. Urinalysis noted 6-10 WBC and few bacteria. Did not have very much urine so the microscopic was on on concentrated specimen. Urine culture performed. Prior UTI Escherichia coli was sensitive to less than 0.25 Cipro Floxin. We'll treat with ciprofloxacin 250 mg twice a day 7 days. Will follow up if symptoms persist, worsen or recur. Discussed needing to increase her fluid intake during the day so that she is voiding multiple times during the day to help prevent recurrent UTIs.    Anastasio Auerbach MD, 12:42 PM 11/09/2015

## 2015-11-11 LAB — URINE CULTURE: ORGANISM ID, BACTERIA: NO GROWTH

## 2015-11-30 ENCOUNTER — Other Ambulatory Visit: Payer: Self-pay | Admitting: Internal Medicine

## 2015-12-21 DIAGNOSIS — R69 Illness, unspecified: Secondary | ICD-10-CM | POA: Diagnosis not present

## 2016-01-06 ENCOUNTER — Other Ambulatory Visit: Payer: Self-pay | Admitting: Internal Medicine

## 2016-01-06 NOTE — Telephone Encounter (Signed)
Left voicemail for patient to return call regarding simvastation 20mg  and how she is taking It?

## 2016-01-07 ENCOUNTER — Other Ambulatory Visit: Payer: Self-pay | Admitting: *Deleted

## 2016-01-07 ENCOUNTER — Other Ambulatory Visit: Payer: Self-pay | Admitting: Internal Medicine

## 2016-01-07 MED ORDER — LEVOTHYROXINE SODIUM 100 MCG PO TABS: ORAL_TABLET | ORAL | 0 refills | Status: DC

## 2016-01-07 NOTE — Telephone Encounter (Signed)
Patient requested refill. Patient made an appointment.

## 2016-01-08 ENCOUNTER — Other Ambulatory Visit: Payer: Self-pay | Admitting: Internal Medicine

## 2016-01-08 DIAGNOSIS — Z1231 Encounter for screening mammogram for malignant neoplasm of breast: Secondary | ICD-10-CM

## 2016-01-11 DIAGNOSIS — R69 Illness, unspecified: Secondary | ICD-10-CM | POA: Diagnosis not present

## 2016-02-10 ENCOUNTER — Encounter: Payer: Self-pay | Admitting: Internal Medicine

## 2016-02-10 ENCOUNTER — Ambulatory Visit (INDEPENDENT_AMBULATORY_CARE_PROVIDER_SITE_OTHER): Payer: Medicare HMO | Admitting: Internal Medicine

## 2016-02-10 VITALS — BP 132/74 | HR 67 | Temp 97.8°F | Ht 67.0 in | Wt 192.8 lb

## 2016-02-10 DIAGNOSIS — E034 Atrophy of thyroid (acquired): Secondary | ICD-10-CM | POA: Diagnosis not present

## 2016-02-10 DIAGNOSIS — I10 Essential (primary) hypertension: Secondary | ICD-10-CM | POA: Diagnosis not present

## 2016-02-10 DIAGNOSIS — E785 Hyperlipidemia, unspecified: Secondary | ICD-10-CM | POA: Diagnosis not present

## 2016-02-10 DIAGNOSIS — M1990 Unspecified osteoarthritis, unspecified site: Secondary | ICD-10-CM

## 2016-02-10 MED ORDER — LOSARTAN POTASSIUM 100 MG PO TABS: ORAL_TABLET | ORAL | 1 refills | Status: DC

## 2016-02-10 MED ORDER — AMLODIPINE BESYLATE 10 MG PO TABS: 10.0000 mg | ORAL_TABLET | Freq: Every day | ORAL | 1 refills | Status: DC

## 2016-02-10 MED ORDER — LEVOTHYROXINE SODIUM 100 MCG PO TABS: ORAL_TABLET | ORAL | 1 refills | Status: DC

## 2016-02-10 NOTE — Patient Instructions (Addendum)
Continue current medications as ordered  Follow up with specialists as scheduled  Will call with labs results  Follow up for CPE in 4 mos. Needs lab appt 1-2 weeks
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
## 2016-02-12 ENCOUNTER — Ambulatory Visit: Payer: Medicare HMO

## 2016-03-10 ENCOUNTER — Ambulatory Visit
Admission: RE | Admit: 2016-03-10 | Discharge: 2016-03-10 | Disposition: A | Discharge status: Home / Self Care | Payer: Medicare HMO | Source: Ambulatory Visit | Attending: Internal Medicine | Admitting: Internal Medicine

## 2016-03-10 DIAGNOSIS — Z1231 Encounter for screening mammogram for malignant neoplasm of breast: Secondary | ICD-10-CM | POA: Diagnosis not present

## 2016-03-11 ENCOUNTER — Other Ambulatory Visit: Payer: Self-pay | Admitting: Internal Medicine

## 2016-03-11 DIAGNOSIS — R928 Other abnormal and inconclusive findings on diagnostic imaging of breast: Secondary | ICD-10-CM

## 2016-03-22 ENCOUNTER — Ambulatory Visit
Admission: RE | Admit: 2016-03-22 | Discharge: 2016-03-22 | Disposition: A | Discharge status: Home / Self Care | Payer: Medicare HMO | Source: Ambulatory Visit | Attending: Internal Medicine | Admitting: Internal Medicine

## 2016-03-22 DIAGNOSIS — E7800 Pure hypercholesterolemia, unspecified: Secondary | ICD-10-CM | POA: Diagnosis not present

## 2016-03-22 DIAGNOSIS — M13161 Monoarthritis, not elsewhere classified, right knee: Secondary | ICD-10-CM | POA: Diagnosis not present

## 2016-03-22 DIAGNOSIS — Z791 Long term (current) use of non-steroidal anti-inflammatories (NSAID): Secondary | ICD-10-CM | POA: Diagnosis not present

## 2016-03-22 DIAGNOSIS — R928 Other abnormal and inconclusive findings on diagnostic imaging of breast: Secondary | ICD-10-CM

## 2016-03-22 DIAGNOSIS — Z7982 Long term (current) use of aspirin: Secondary | ICD-10-CM | POA: Diagnosis not present

## 2016-03-22 DIAGNOSIS — M47816 Spondylosis without myelopathy or radiculopathy, lumbar region: Secondary | ICD-10-CM | POA: Diagnosis not present

## 2016-03-22 DIAGNOSIS — Z0000 Encounter for general adult medical examination without abnormal findings: Secondary | ICD-10-CM | POA: Diagnosis not present

## 2016-03-22 DIAGNOSIS — Z87891 Personal history of nicotine dependence: Secondary | ICD-10-CM | POA: Diagnosis not present

## 2016-03-22 DIAGNOSIS — N6314 Unspecified lump in the right breast, lower inner quadrant: Secondary | ICD-10-CM | POA: Diagnosis not present

## 2016-03-22 DIAGNOSIS — M4306 Spondylolysis, lumbar region: Secondary | ICD-10-CM | POA: Diagnosis not present

## 2016-03-22 DIAGNOSIS — I10 Essential (primary) hypertension: Secondary | ICD-10-CM | POA: Diagnosis not present

## 2016-03-22 DIAGNOSIS — E039 Hypothyroidism, unspecified: Secondary | ICD-10-CM | POA: Diagnosis not present

## 2016-06-06 ENCOUNTER — Other Ambulatory Visit: Payer: Self-pay | Admitting: *Deleted

## 2016-06-06 MED ORDER — LOSARTAN POTASSIUM 100 MG PO TABS: ORAL_TABLET | ORAL | 0 refills | Status: DC

## 2016-06-06 MED ORDER — LEVOTHYROXINE SODIUM 100 MCG PO TABS: ORAL_TABLET | ORAL | 0 refills | Status: DC

## 2016-06-06 MED ORDER — AMLODIPINE BESYLATE 10 MG PO TABS: 10.0000 mg | ORAL_TABLET | Freq: Every day | ORAL | 0 refills | Status: DC

## 2016-06-06 NOTE — Telephone Encounter (Signed)
Patient requested. Appointment was rescheduled per provider. Changed pharmacies to [facility].

## 2016-06-13 ENCOUNTER — Other Ambulatory Visit: Payer: Medicare HMO

## 2016-06-14 ENCOUNTER — Telehealth: Payer: Self-pay | Admitting: Internal Medicine

## 2016-06-14 NOTE — Telephone Encounter (Signed)
I called the patient to schedule her AWV, but she stated that a nurse from [facility] had already been to her house to complete it.  I explained that she has the option to have it done here in the office in the future. VDM (DD)

## 2016-06-15 ENCOUNTER — Encounter: Payer: Medicare HMO | Admitting: Internal Medicine

## 2016-06-23 DIAGNOSIS — R69 Illness, unspecified: Secondary | ICD-10-CM | POA: Diagnosis not present

## 2016-07-04 ENCOUNTER — Ambulatory Visit (INDEPENDENT_AMBULATORY_CARE_PROVIDER_SITE_OTHER): Payer: Medicare HMO | Admitting: Orthopedic Surgery

## 2016-07-04 ENCOUNTER — Encounter (INDEPENDENT_AMBULATORY_CARE_PROVIDER_SITE_OTHER): Payer: Self-pay | Admitting: Orthopedic Surgery

## 2016-07-04 ENCOUNTER — Ambulatory Visit (INDEPENDENT_AMBULATORY_CARE_PROVIDER_SITE_OTHER): Payer: Medicare HMO

## 2016-07-04 VITALS — Ht 67.0 in | Wt 192.0 lb

## 2016-07-04 DIAGNOSIS — M1711 Unilateral primary osteoarthritis, right knee: Secondary | ICD-10-CM | POA: Diagnosis not present
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
## 2016-07-13 ENCOUNTER — Telehealth (INDEPENDENT_AMBULATORY_CARE_PROVIDER_SITE_OTHER): Payer: Self-pay

## 2016-07-13 ENCOUNTER — Encounter: Payer: Self-pay | Admitting: Gynecology

## 2016-07-13 NOTE — Telephone Encounter (Signed)
I called and spoke with Vinnie Level, this was faxed yesterday with patients clinical information.

## 2016-07-13 NOTE — Telephone Encounter (Signed)
Vinnie Level would like to know if authorization had been submitted for Synvisvc One for patient.  CB# is [phone].

## 2016-07-14 ENCOUNTER — Ambulatory Visit (INDEPENDENT_AMBULATORY_CARE_PROVIDER_SITE_OTHER): Payer: Medicare HMO | Admitting: Family

## 2016-07-14 DIAGNOSIS — M1711 Unilateral primary osteoarthritis, right knee: Secondary | ICD-10-CM

## 2016-07-14 MED ORDER — HYLAN G-F 20 48 MG/6ML IX SOSY
48.0000 mg | PREFILLED_SYRINGE | INTRA_ARTICULAR | Status: AC | PRN
  Administered 2016-07-14: 48 mg via INTRA_ARTICULAR

## 2016-07-14 MED ORDER — LIDOCAINE HCL 1 % IJ SOLN
1.0000 mL | INTRAMUSCULAR | Status: AC | PRN
  Administered 2016-07-14: 1 mL
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
## 2016-07-19 ENCOUNTER — Telehealth: Payer: Self-pay | Admitting: *Deleted

## 2016-07-19 ENCOUNTER — Encounter: Payer: Self-pay | Admitting: Podiatry

## 2016-07-19 ENCOUNTER — Ambulatory Visit (INDEPENDENT_AMBULATORY_CARE_PROVIDER_SITE_OTHER): Payer: Medicare HMO | Admitting: Podiatry

## 2016-07-19 VITALS — BP 163/104 | HR 75

## 2016-07-19 DIAGNOSIS — L600 Ingrowing nail: Secondary | ICD-10-CM | POA: Diagnosis not present

## 2016-07-19 DIAGNOSIS — R0989 Other specified symptoms and signs involving the circulatory and respiratory systems: Secondary | ICD-10-CM

## 2016-07-19 NOTE — Telephone Encounter (Signed)
-----   Message from Tonye Pearson, RMA sent at 07/19/2016 10:25 AM EDT ----- Regarding: arterial doppler Dr Amalia Hailey would like to order an lower arterial doppler ABI's & TBI's bilateral.   He is currently putting in his dictation.

## 2016-07-19 NOTE — Progress Notes (Signed)
   Subjective:    Patient ID: Tamara Larson, female    DOB: Jul 18, 1940, 76 y.o.   MRN: [id number]  HPI This patient presents today concerned about a thickening in her hallux toenails that occurred over the last several months. Patient has had no specific treatment for the thickening in these toenails. Also, patient complains of pain along the medial border of the left hallux toenail approximately a month ago describes trimming the margin out releasing some pus and reducing the pain in the area. Patient is a former smoker   Review of Systems  Musculoskeletal:       Joint pain  Skin:       Change in nails  Allergic/Immunologic: Positive for food allergies.       Objective:   Physical Exam  Orientated 3  Peripheral pitting edema left greater than right DP pulses 2/4 bilaterally PT pulses nonpalpable bilaterally Capillary reflex immediate bilaterally  Neurological: Sensation to 10 g monofilament wire intact 5/5 bilaterally Operatory sensation reactive bilaterally Ankle reflexes reactive bilaterally  Dermatological: Well-healed surgical scar medial right ankle and dorsal left first MPJ No open skin lesions bilaterally Right hallux nail appears deformed with texture and color changes Left hallux toenail is deformed and incurvated and tender to palpation on the medial border  Musculoskeletal: HAV right Manual motor testing dorsi flexion, plantar flexion, inversion, eversion 5/5 bilaterally       Assessment & Plan:   Assessment: Absent posterior tibial pulses bilaterally Ingrowing medial border of left hallux toenail Mycotic hallux toenails bilaterally  Plan: At this time I informed patient that the posterior tibial pulses were nonpalpable today and I am referring her to the vascular lab for lower extremity arterial Doppler for the indication of absent posterior tibial pulses bilaterally Tamara Larson notify patient of results of the arterial exam. If there is no vascular  contraindication will recommend phenol matricectomy to the medial margin the left hallux toenail  Notify patient results of vascular exam

## 2016-07-19 NOTE — Patient Instructions (Signed)
Today I had difficulty feeling the main plantar your feet. I am referring you to the vascular lab for lower extremity arterial Doppler to check the circulation into your feet and will notify you of the results. If the exam turns out satisfactory I will recommend removal of the ingrowing margin of the left great toenail

## 2016-07-20 ENCOUNTER — Other Ambulatory Visit: Payer: Medicare HMO

## 2016-07-20 DIAGNOSIS — I10 Essential (primary) hypertension: Secondary | ICD-10-CM | POA: Diagnosis not present

## 2016-07-20 DIAGNOSIS — E034 Atrophy of thyroid (acquired): Secondary | ICD-10-CM | POA: Diagnosis not present

## 2016-07-20 DIAGNOSIS — E785 Hyperlipidemia, unspecified: Secondary | ICD-10-CM | POA: Diagnosis not present

## 2016-07-20 LAB — LIPID PANEL
CHOL/HDL RATIO: 3.1 ratio (ref <5.0)
Cholesterol: 226 mg/dL — ABNORMAL HIGH (ref <200)
HDL: 74 mg/dL (ref >50)
LDL CALC: 130 mg/dL — AB (ref <100)
Triglycerides: 111 mg/dL (ref <150)
VLDL: 22 mg/dL (ref <30)

## 2016-07-20 LAB — COMPLETE METABOLIC PANEL WITH GFR
ALT: 11 U/L (ref 6–29)
AST: 11 U/L (ref 10–35)
Albumin: 4.1 g/dL (ref 3.6–5.1)
Alkaline Phosphatase: 107 U/L (ref 33–130)
BILIRUBIN TOTAL: 0.9 mg/dL (ref 0.2–1.2)
BUN: 14 mg/dL (ref 7–25)
CO2: 27 mmol/L (ref 20–31)
Calcium: 9.2 mg/dL (ref 8.6–10.4)
Chloride: 104 mmol/L (ref 98–110)
Creat: 1.03 mg/dL — ABNORMAL HIGH (ref 0.60–0.93)
GFR, EST NON AFRICAN AMERICAN: 54 mL/min — AB (ref >=60)
GFR, Est African American: 62 mL/min (ref >=60)
GLUCOSE: 95 mg/dL (ref 65–99)
Potassium: 3.7 mmol/L (ref 3.5–5.3)
Sodium: 141 mmol/L (ref 135–146)
Total Protein: 6.9 g/dL (ref 6.1–8.1)

## 2016-07-21 LAB — TSH: TSH: 0.73 m[IU]/L

## 2016-07-22 ENCOUNTER — Encounter: Payer: Self-pay | Admitting: Internal Medicine

## 2016-07-22 ENCOUNTER — Ambulatory Visit (INDEPENDENT_AMBULATORY_CARE_PROVIDER_SITE_OTHER): Payer: Medicare HMO | Admitting: Internal Medicine

## 2016-07-22 VITALS — BP 124/80 | HR 67 | Temp 97.7°F | Ht 67.0 in | Wt 193.4 lb

## 2016-07-22 DIAGNOSIS — N6001 Solitary cyst of right breast: Secondary | ICD-10-CM

## 2016-07-22 DIAGNOSIS — E785 Hyperlipidemia, unspecified: Secondary | ICD-10-CM

## 2016-07-22 DIAGNOSIS — M1990 Unspecified osteoarthritis, unspecified site: Secondary | ICD-10-CM

## 2016-07-22 DIAGNOSIS — E034 Atrophy of thyroid (acquired): Secondary | ICD-10-CM | POA: Diagnosis not present

## 2016-07-22 DIAGNOSIS — Z0000 Encounter for general adult medical examination without abnormal findings: Secondary | ICD-10-CM

## 2016-07-22 DIAGNOSIS — I10 Essential (primary) hypertension: Secondary | ICD-10-CM | POA: Diagnosis not present

## 2016-07-22 NOTE — Patient Instructions (Signed)
Continue current medications as ordered  Follow up with eye specialist as scheduled  Follow up with podiatry for foot care  Follow up in 6 mos for thyroid and HTN  Keeping You Healthy  Get These Tests  Blood Pressure- Have your blood pressure checked by your healthcare provider at least once a year.  Normal blood pressure is 120/80.  Weight- Have your body mass index (BMI) calculated to screen for obesity.  BMI is a measure of body fat based on height and weight.  You can calculate your own BMI at [employer]  Cholesterol- Have your cholesterol checked every year.  Diabetes- Have your blood sugar checked every year if you have high blood pressure, high cholesterol, a family history of diabetes or if you are overweight.  Pap Test - Have a pap test every 1 to 5 years if you have been sexually active.  If you are older than 65 and recent pap tests have been normal you may not need additional pap tests.  In addition, if you have had a hysterectomy  for benign disease additional pap tests are not necessary.  Mammogram-Yearly mammograms are essential for early detection of breast cancer  Screening for Colon Cancer- Colonoscopy starting at age 79. Screening may begin sooner depending on your family history and other health conditions.  Follow up colonoscopy as directed by your Gastroenterologist.  Screening for Osteoporosis- Screening begins at age 66 with bone density scanning, sooner if you are at higher risk for developing Osteoporosis.  Get these medicines  Calcium with Vitamin D- Your body requires 1200-1500 mg of Calcium a day and [phone] IU of Vitamin D a day.  You can only absorb 500 mg of Calcium at a time therefore Calcium must be taken in 2 or 3 separate doses throughout the day.  Hormones- Hormone therapy has been associated with increased risk for certain cancers and heart disease.  Talk to your healthcare provider about if you need relief from menopausal  symptoms.  Aspirin- Ask your healthcare provider about taking Aspirin to prevent Heart Disease and Stroke.  Get these Immuniztions  Flu shot- Every fall  Pneumonia shot- Once after the age of 84; if you are younger ask your healthcare provider if you need a pneumonia shot.  Tetanus- Every ten years.  Zostavax- Once after the age of 9 to prevent shingles.  Take these steps  Don't smoke- Your healthcare provider can help you quit. For tips on how to quit, ask your healthcare provider or go to www.smokefree.gov or call 1-800 QUIT-NOW.  Be physically active- Exercise 5 days a week for a minimum of 30 minutes.  If you are not already physically active, start slow and gradually work up to 30 minutes of moderate physical activity.  Try walking, dancing, bike riding, swimming, etc.  Eat a healthy diet- Eat a variety of healthy foods such as fruits, vegetables, whole grains, low fat milk, low fat cheeses, yogurt, lean meats, chicken, fish, eggs, dried beans, tofu, etc.  For more information go to www.thenutritionsource.org  Dental visit- Brush and floss teeth twice daily; visit your dentist twice a year.  Eye exam- Visit your Optometrist or Ophthalmologist yearly.  Drink alcohol in moderation- Limit alcohol intake to one drink or less a day.  Never drink and drive.  Depression- Your emotional health is as important as your physical health.  If you're feeling down or losing interest in things you normally enjoy, please talk to your healthcare provider.  Seat Belts- can save your  life; always wear one  Smoke/Carbon Monoxide detectors- These detectors need to be installed on the appropriate level of your home.  Replace batteries at least once a year.  Violence- If anyone is threatening or hurting you, please tell your healthcare provider.  Living Will/ Health care power of attorney- Discuss with your healthcare provider and family.
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
## 2016-08-04 ENCOUNTER — Ambulatory Visit (HOSPITAL_COMMUNITY)
Admission: RE | Admit: 2016-08-04 | Discharge: 2016-08-04 | Disposition: A | Discharge status: Home / Self Care | Payer: Medicare HMO | Source: Ambulatory Visit | Attending: Cardiovascular Disease | Admitting: Cardiovascular Disease

## 2016-08-04 DIAGNOSIS — I10 Essential (primary) hypertension: Secondary | ICD-10-CM | POA: Diagnosis not present

## 2016-08-04 DIAGNOSIS — E785 Hyperlipidemia, unspecified: Secondary | ICD-10-CM | POA: Insufficient documentation

## 2016-08-04 DIAGNOSIS — Z87891 Personal history of nicotine dependence: Secondary | ICD-10-CM | POA: Diagnosis not present

## 2016-08-04 DIAGNOSIS — R0989 Other specified symptoms and signs involving the circulatory and respiratory systems: Secondary | ICD-10-CM | POA: Diagnosis not present

## 2016-08-04 DIAGNOSIS — I739 Peripheral vascular disease, unspecified: Secondary | ICD-10-CM | POA: Diagnosis not present

## 2016-08-16 NOTE — Telephone Encounter (Addendum)
-----   Message from Tamara Larson, DPM sent at 08/16/2016  7:57 AM EDT ----- Mild PAD ask Dr. Gwenlyn Larson if want a follow up Tamara Larson ,DPM  Impressions No evidence of segmental lower extremity arterial disease at rest, bilaterally. Normal right ABI. Normal left ABI, with abnormal left posterior tibial artery waveforms suggestive of isolated tibial vessel disease. Right great toe-brachial index is abnormal. Left great toe-brachial index is normal. PPG's of nine toes are normal, with the left third toe severely damped and abnormal. I informed pt of Dr. Phoebe Larson review of results and recommendations. Pt agrees and I told her I would make the referral to [facility] where she had been tested. Faxed referral, clinicals and demographics to [place].

## 2016-08-26 ENCOUNTER — Ambulatory Visit (INDEPENDENT_AMBULATORY_CARE_PROVIDER_SITE_OTHER): Payer: Medicare HMO | Admitting: Cardiovascular Disease

## 2016-08-26 ENCOUNTER — Encounter: Payer: Self-pay | Admitting: Cardiovascular Disease

## 2016-08-26 DIAGNOSIS — E785 Hyperlipidemia, unspecified: Secondary | ICD-10-CM

## 2016-08-26 DIAGNOSIS — R600 Localized edema: Secondary | ICD-10-CM | POA: Diagnosis not present

## 2016-08-26 DIAGNOSIS — R0989 Other specified symptoms and signs involving the circulatory and respiratory systems: Secondary | ICD-10-CM | POA: Diagnosis not present

## 2016-08-26 DIAGNOSIS — I10 Essential (primary) hypertension: Secondary | ICD-10-CM

## 2016-08-26 NOTE — Progress Notes (Signed)
08/26/2016 Tamara Larson   12-17-1940  [id number]  Primary Physician Gildardo Cranker, DO Primary Cardiologist: Lorretta Harp MD Renae Gloss  HPI:  Tamara Larson is a delightful 76 year old moderately overweight widowed African-American female mother of one child, grandmother of 3 grandchildren referred by Dr. Amalia Hailey for peripheral vascular evaluation because of absent pedal pulses on exam. She has a history of treated hypertension and hyperlipidemia. She was apparently seen for ingrown toenails and apparently feel pulses were not able to be felt although she denies claudication. She had Dopplers that showed normal ABIs. He she does get some mild swelling in her legs on occasion which improved with compression stockings.   Current Outpatient Prescriptions  Medication Sig Dispense Refill  . amLODipine (NORVASC) 10 MG tablet Take 1 tablet (10 mg total) by mouth daily. 90 tablet 0  . aspirin 81 MG tablet Take 81 mg by mouth daily.    Marland Kitchen levothyroxine (SYNTHROID, LEVOTHROID) 100 MCG tablet Take one tablet by mouth once daily 30 minutes before breakfast for thyroid 90 tablet 0  . losartan (COZAAR) 100 MG tablet Take one tablet by mouth once daily 90 tablet 0  . Multiple Vitamins-Minerals (CENTRUM MULTIGUMMIES PO) Take by mouth daily.    . naproxen sodium (ANAPROX) 220 MG tablet Take 220 mg by mouth as needed.    . simvastatin (ZOCOR) 20 MG tablet Take 20 mg by mouth. Take on Tues, Thurs, and Saturday     No current facility-administered medications for this visit.     Allergies  Allergen Reactions  . Shellfish Allergy Swelling    Social History   Social History  . Marital status: Widowed    Spouse name: N/A  . Number of children: N/A  . Years of education: N/A   Occupational History  . Not on file.   Social History Main Topics  . Smoking status: Former Smoker    Packs/day: 0.00  . Smokeless tobacco: Never Used  . Alcohol use No  . Drug use: No  . Sexual  activity: No     Comment: 1st intercourse 76 yo-Fewer than 5 partners   Other Topics Concern  . Not on file   Social History Narrative   Do you drink/eat things with caffeine? Yes, coffee   Was married x 26 years, widow   Lives in a one stories house, one person, no pets   Past/Current profession- Sales executive   Patient exercises daily      Review of Systems: General: negative for chills, fever, night sweats or weight changes.  Cardiovascular: negative for chest pain, dyspnea on exertion, edema, orthopnea, palpitations, paroxysmal nocturnal dyspnea or shortness of breath Dermatological: negative for rash Respiratory: negative for cough or wheezing Urologic: negative for hematuria Abdominal: negative for nausea, vomiting, diarrhea, bright red blood per rectum, melena, or hematemesis Neurologic: negative for visual changes, syncope, or dizziness All other systems reviewed and are otherwise negative except as noted above.    Blood pressure 124/80, pulse 68, height 5\' 7"  (1.702 m), weight 193 lb (87.5 kg).  General appearance: alert and no distress Neck: no adenopathy, no carotid bruit, no JVD, supple, symmetrical, trachea midline and thyroid not enlarged, symmetric, no tenderness/mass/nodules Lungs: clear to auscultation bilaterally Heart: regular rate and rhythm, S1, S2 normal, no murmur, click, rub or gallop Extremities: trace bilateral lower extremity edema, palpable pedal pulses  EKG Sinus rhythm at 68 without ST or T-wave changes. I Personally reviewed this EKG  ASSESSMENT AND  PLAN:   HTN (hypertension) History of hypertension blood pressure measured at 124/80. She is on amlodipine and losartan. Continue current meds at current dosing  Hyperlipidemia LDL goal <100 History of hyperlipidemia on statin therapy followed by her PCP  Bilateral lower extremity edema History of lower extremity edema possibly related to amlodipine and/or venous insufficiency. This  improves with the use of compression stockings.  Absent pedal pulses Tamara Larson was referred by Dr. Amalia Hailey for absent pedal pulses. I was able to palpate her pulses today on exam. Her Doppler studies performed 08/02/16 revealed normal ABIs bilaterally. She denies symptoms of claudication. There are no open wounds. No further workup is necessary at this time.      Lorretta Harp MD FACP,FACC,FAHA, [facility] 08/26/2016 11:18 AM

## 2016-08-26 NOTE — Assessment & Plan Note (Signed)
History of hypertension blood pressure measured at 124/80. She is on amlodipine and losartan. Continue current meds at current dosing

## 2016-08-26 NOTE — Assessment & Plan Note (Signed)
History of hyperlipidemia on statin therapy followed by her PCP. 

## 2016-08-26 NOTE — Assessment & Plan Note (Signed)
Ms. Tamara Larson was referred by Dr. Amalia Hailey for absent pedal pulses. I was able to palpate her pulses today on exam. Her Doppler studies performed 08/02/16 revealed normal ABIs bilaterally. She denies symptoms of claudication. There are no open wounds. No further workup is necessary at this time.

## 2016-08-26 NOTE — Patient Instructions (Signed)
Medication Instructions: Your physician recommends that you continue on your current medications as directed. Please refer to the Current Medication list given to you today.   Follow-Up: Your physician recommends that you schedule a follow-up appointment as needed with Dr. Berry.    

## 2016-08-26 NOTE — Assessment & Plan Note (Signed)
History of lower extremity edema possibly related to amlodipine and/or venous insufficiency. This improves with the use of compression stockings.

## 2016-09-02 ENCOUNTER — Other Ambulatory Visit: Payer: Self-pay | Admitting: Internal Medicine

## 2016-09-06 ENCOUNTER — Ambulatory Visit: Payer: Medicare HMO | Admitting: Cardiovascular Disease

## 2016-09-20 ENCOUNTER — Encounter: Payer: Self-pay | Admitting: Internal Medicine

## 2016-09-20 ENCOUNTER — Ambulatory Visit (INDEPENDENT_AMBULATORY_CARE_PROVIDER_SITE_OTHER): Payer: Medicare HMO | Admitting: Internal Medicine

## 2016-09-20 VITALS — BP 118/82 | HR 63 | Temp 97.7°F | Ht 67.0 in | Wt 192.2 lb

## 2016-09-20 DIAGNOSIS — R3915 Urgency of urination: Secondary | ICD-10-CM | POA: Diagnosis not present

## 2016-09-20 DIAGNOSIS — R8290 Unspecified abnormal findings in urine: Secondary | ICD-10-CM

## 2016-09-20 LAB — POCT URINALYSIS DIPSTICK
Bilirubin, UA: NEGATIVE
Blood, UA: NEGATIVE
Glucose, UA: NEGATIVE
Ketones, UA: NEGATIVE
NITRITE UA: NEGATIVE
PH UA: 7.5 (ref 5.0–8.0)
Spec Grav, UA: <=1.005 — AB (ref 1.010–1.025)
Urobilinogen, UA: 1 E.U./dL

## 2016-09-20 MED ORDER — CIPROFLOXACIN HCL 250 MG PO TABS: 250.0000 mg | ORAL_TABLET | Freq: Two times a day (BID) | ORAL | 0 refills | Status: DC
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
## 2016-09-20 NOTE — Addendum Note (Signed)
Addended by: Moshe Cipro, MESHELL A on: 09/20/2016 04:00 PM   Modules accepted: Orders

## 2016-09-20 NOTE — Patient Instructions (Addendum)
Start Cipro 250mg  2 times daily x 3 days for bladder infection  Will call with culture results  Push fluids  Wear TED stockings while up and about  Follow up as scheduled

## 2016-09-21 ENCOUNTER — Ambulatory Visit: Payer: Medicare HMO | Admitting: Internal Medicine

## 2016-09-22 LAB — URINE CULTURE: ORGANISM ID, BACTERIA: NO GROWTH

## 2016-10-10 DIAGNOSIS — H524 Presbyopia: Secondary | ICD-10-CM | POA: Diagnosis not present

## 2016-10-10 DIAGNOSIS — E119 Type 2 diabetes mellitus without complications: Secondary | ICD-10-CM | POA: Diagnosis not present

## 2016-11-04 DIAGNOSIS — H25012 Cortical age-related cataract, left eye: Secondary | ICD-10-CM | POA: Diagnosis not present

## 2016-11-04 DIAGNOSIS — H25041 Posterior subcapsular polar age-related cataract, right eye: Secondary | ICD-10-CM | POA: Diagnosis not present

## 2016-11-04 DIAGNOSIS — H401133 Primary open-angle glaucoma, bilateral, severe stage: Secondary | ICD-10-CM | POA: Diagnosis not present

## 2016-12-29 ENCOUNTER — Other Ambulatory Visit: Payer: Self-pay

## 2016-12-29 MED ORDER — SIMVASTATIN 20 MG PO TABS: ORAL_TABLET | ORAL | 4 refills | Status: DC

## 2017-01-25 ENCOUNTER — Encounter: Payer: Self-pay | Admitting: Internal Medicine

## 2017-01-25 ENCOUNTER — Ambulatory Visit (INDEPENDENT_AMBULATORY_CARE_PROVIDER_SITE_OTHER): Payer: Medicare HMO | Admitting: Internal Medicine

## 2017-01-25 VITALS — BP 128/78 | HR 70 | Temp 97.9°F | Ht 67.0 in | Wt 192.0 lb

## 2017-01-25 DIAGNOSIS — E034 Atrophy of thyroid (acquired): Secondary | ICD-10-CM

## 2017-01-25 DIAGNOSIS — M1990 Unspecified osteoarthritis, unspecified site: Secondary | ICD-10-CM

## 2017-01-25 DIAGNOSIS — Z79899 Other long term (current) drug therapy: Secondary | ICD-10-CM

## 2017-01-25 DIAGNOSIS — I10 Essential (primary) hypertension: Secondary | ICD-10-CM | POA: Diagnosis not present

## 2017-01-25 DIAGNOSIS — E785 Hyperlipidemia, unspecified: Secondary | ICD-10-CM | POA: Diagnosis not present

## 2017-01-25 LAB — BASIC METABOLIC PANEL WITH GFR
BUN: 15 mg/dL (ref 7–25)
CALCIUM: 9.5 mg/dL (ref 8.6–10.4)
CHLORIDE: 107 mmol/L (ref 98–110)
CO2: 26 mmol/L (ref 20–32)
Creat: 0.84 mg/dL (ref 0.60–0.93)
GFR, EST AFRICAN AMERICAN: 79 mL/min/{1.73_m2} (ref >=60)
GFR, EST NON AFRICAN AMERICAN: 68 mL/min/{1.73_m2} (ref >=60)
Glucose, Bld: 88 mg/dL (ref 65–99)
Potassium: 3.8 mmol/L (ref 3.5–5.3)
Sodium: 142 mmol/L (ref 135–146)

## 2017-01-25 LAB — LIPID PANEL
Cholesterol: 287 mg/dL — ABNORMAL HIGH (ref <200)
HDL: 84 mg/dL (ref >50)
LDL Cholesterol (Calc): 179 mg/dL (calc) — ABNORMAL HIGH
Non-HDL Cholesterol (Calc): 203 mg/dL (calc) — ABNORMAL HIGH (ref <130)
TRIGLYCERIDES: 109 mg/dL (ref <150)
Total CHOL/HDL Ratio: 3.4 (calc) (ref <5.0)

## 2017-01-25 LAB — TSH: TSH: 0.43 m[IU]/L (ref 0.40–4.50)

## 2017-01-25 LAB — ALT: ALT: 10 U/L (ref 6–29)
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
## 2017-01-25 NOTE — Patient Instructions (Signed)
Continue current medications as ordered  Will call with lab results  Follow up with specialists as scheduled  Follow up in 6 mos for CPE/AWV. Fasting labs prior to appt

## 2017-02-01 ENCOUNTER — Telehealth: Payer: Self-pay

## 2017-02-01 NOTE — Telephone Encounter (Signed)
Patient stated that she has heard bad things about this injection. She also stated that she has been forgetting to talk her meds at night and she will start making sure she takes her meds daily for 30 days, and will return in 30 days to have her labs rechecked.

## 2017-02-01 NOTE — Telephone Encounter (Signed)
Noted. Will need to repeat labs at next ov. Take all meds as ordered

## 2017-03-23 ENCOUNTER — Ambulatory Visit (INDEPENDENT_AMBULATORY_CARE_PROVIDER_SITE_OTHER): Payer: Medicare HMO | Admitting: Nurse Practitioner

## 2017-03-23 ENCOUNTER — Encounter: Payer: Self-pay | Admitting: Nurse Practitioner

## 2017-03-23 VITALS — BP 122/84 | HR 92 | Temp 98.8°F | Ht 67.0 in | Wt 186.0 lb

## 2017-03-23 DIAGNOSIS — J00 Acute nasopharyngitis [common cold]: Secondary | ICD-10-CM | POA: Diagnosis not present

## 2017-03-23 MED ORDER — BENZONATATE 100 MG PO CAPS: 100.0000 mg | ORAL_CAPSULE | Freq: Two times a day (BID) | ORAL | 0 refills | Status: DC | PRN

## 2017-03-23 NOTE — Patient Instructions (Addendum)
To use benzonatate twice daily as needed for cough Cont to use tylenol as needed sore throat/aches Continue to increase fluid intake and vit C Can use warm water, lemon and honey for throat Cough drops as needed  Notify us if symptoms worse/fever
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
## 2017-03-29 ENCOUNTER — Ambulatory Visit (INDEPENDENT_AMBULATORY_CARE_PROVIDER_SITE_OTHER): Payer: Medicare HMO | Admitting: Internal Medicine

## 2017-03-29 ENCOUNTER — Encounter: Payer: Self-pay | Admitting: Internal Medicine

## 2017-03-29 ENCOUNTER — Ambulatory Visit
Admission: RE | Admit: 2017-03-29 | Discharge: 2017-03-29 | Disposition: A | Discharge status: Home / Self Care | Payer: Medicare HMO | Source: Ambulatory Visit | Attending: Internal Medicine | Admitting: Internal Medicine

## 2017-03-29 VITALS — BP 144/78 | HR 82 | Temp 98.3°F | Ht 67.0 in | Wt 185.4 lb

## 2017-03-29 DIAGNOSIS — R05 Cough: Secondary | ICD-10-CM | POA: Diagnosis not present

## 2017-03-29 DIAGNOSIS — R0602 Shortness of breath: Secondary | ICD-10-CM

## 2017-03-29 DIAGNOSIS — J181 Lobar pneumonia, unspecified organism: Secondary | ICD-10-CM | POA: Diagnosis not present

## 2017-03-29 DIAGNOSIS — J189 Pneumonia, unspecified organism: Secondary | ICD-10-CM

## 2017-03-29 LAB — CBC WITH DIFFERENTIAL/PLATELET
BASOS PCT: 0.6 %
Basophils Absolute: 42 cells/uL (ref 0–200)
EOS ABS: 42 {cells}/uL (ref 15–500)
EOS PCT: 0.6 %
HCT: 39 % (ref 35.0–45.0)
Hemoglobin: 13.1 g/dL (ref 11.7–15.5)
Lymphs Abs: 2513 cells/uL (ref 850–3900)
MCH: 30.5 pg (ref 27.0–33.0)
MCHC: 33.6 g/dL (ref 32.0–36.0)
MCV: 90.7 fL (ref 80.0–100.0)
MONOS PCT: 6.7 %
MPV: 11.7 fL (ref 7.5–12.5)
Neutro Abs: 3934 cells/uL (ref 1500–7800)
Neutrophils Relative %: 56.2 %
PLATELETS: 257 10*3/uL (ref 140–400)
RBC: 4.3 10*6/uL (ref 3.80–5.10)
RDW: 12.7 % (ref 11.0–15.0)
TOTAL LYMPHOCYTE: 35.9 %
WBC mixed population: 469 cells/uL (ref 200–950)
WBC: 7 10*3/uL (ref 3.8–10.8)

## 2017-03-29 LAB — BASIC METABOLIC PANEL WITH GFR
BUN: 13 mg/dL (ref 7–25)
CHLORIDE: 104 mmol/L (ref 98–110)
CO2: 27 mmol/L (ref 20–32)
Calcium: 9.3 mg/dL (ref 8.6–10.4)
Creat: 0.84 mg/dL (ref 0.60–0.93)
GFR, Est African American: 79 mL/min/{1.73_m2} (ref >=60)
GFR, Est Non African American: 68 mL/min/{1.73_m2} (ref >=60)
GLUCOSE: 134 mg/dL — AB (ref 65–99)
Potassium: 3.6 mmol/L (ref 3.5–5.3)
Sodium: 142 mmol/L (ref 135–146)

## 2017-03-29 MED ORDER — ALBUTEROL SULFATE HFA 108 (90 BASE) MCG/ACT IN AERS: 2.0000 | INHALATION_SPRAY | Freq: Four times a day (QID) | RESPIRATORY_TRACT | 1 refills | Status: DC | PRN

## 2017-03-29 MED ORDER — ALBUTEROL SULFATE (2.5 MG/3ML) 0.083% IN NEBU
2.5000 mg | INHALATION_SOLUTION | Freq: Once | RESPIRATORY_TRACT | Status: AC
  Administered 2017-03-29: 2.5 mg via RESPIRATORY_TRACT

## 2017-03-29 MED ORDER — LEVOFLOXACIN 500 MG PO TABS
500.0000 mg | ORAL_TABLET | Freq: Every day | ORAL | 0 refills | Status: DC
Start: 2017-03-29 — End: 2017-07-26

## 2017-03-29 MED ORDER — CEFTRIAXONE SODIUM 1 G IJ SOLR
1.0000 g | Freq: Once | INTRAMUSCULAR | Status: AC
  Administered 2017-03-29: 1 g via INTRAMUSCULAR

## 2017-03-29 MED ORDER — METHYLPREDNISOLONE 4 MG PO TBPK: ORAL_TABLET | ORAL | 0 refills | Status: DC
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
## 2017-03-29 NOTE — Patient Instructions (Addendum)
Rocephin injection 1 gm given today  Albuterol nebulizer given in office today  START LEVAQUIN 500 MG DAILY X 10 DAYS  TAKE PROBIOTIC (CULTURELLE, ACTIVIA, YOGURT, FLORASTER) DAILY WHILE ON ANTIBIOTIC  START PREDNISONE TAPER AS DIRECTED  USE INHALER  AS NEEDED FOR COUGH, SHORTNESS OF BREATH OR WHEEZING  Push fluids and rest  Will call with xray results  Continue other medications as ordered  Follow up as scheduled or sooner if not feeling better   Community-Acquired Pneumonia, Adult Pneumonia is an infection of the lungs. One type of pneumonia can happen while a person is in a hospital. A different type can happen when a person is not in a hospital (community-acquired pneumonia). It is easy for this kind to spread from person to person. It can spread to you if you breathe near an infected person who coughs or sneezes. Some symptoms include:  A dry cough.  A wet (productive) cough.  Fever.  Sweating.  Chest pain.  Follow these instructions at home:  Take over-the-counter and prescription medicines only as told by your doctor. ? Only take cough medicine if you are losing sleep. ? If you were prescribed an antibiotic medicine, take it as told by your doctor. Do not stop taking the antibiotic even if you start to feel better.  Sleep with your head and neck raised (elevated). You can do this by putting a few pillows under your head, or you can sleep in a recliner.  Do not use tobacco products. These include cigarettes, chewing tobacco, and e-cigarettes. If you need help quitting, ask your doctor.  Drink enough water to keep your pee (urine) clear or pale yellow. A shot (vaccine) can help prevent pneumonia. Shots are often suggested for:  People older than 76 years of age.  People older than 76 years of age: ? Who are having cancer treatment. ? Who have long-term (chronic) lung disease. ? Who have problems with their body's defense system (immune system).  You may also  prevent pneumonia if you take these actions:  Get the flu (influenza) shot every year.  Go to the dentist as often as told.  Wash your hands often. If soap and water are not available, use hand sanitizer.  Contact a doctor if:  You have a fever.  You lose sleep because your cough medicine does not help. Get help right away if:  You are short of breath and it gets worse.  You have more chest pain.  Your sickness gets worse. This is very serious if: ? You are an older adult. ? Your body's defense system is weak.  You cough up blood. This information is not intended to replace advice given to you by your health care provider. Make sure you discuss any questions you have with your health care provider. Document Released: 08/03/2007 Document Revised: 07/23/2015 Document Reviewed: 06/11/2014 Elsevier Interactive Patient Education  [employer].

## 2017-04-10 ENCOUNTER — Other Ambulatory Visit: Payer: Self-pay | Admitting: Internal Medicine

## 2017-04-22 ENCOUNTER — Other Ambulatory Visit: Payer: Self-pay | Admitting: Internal Medicine

## 2017-05-11 ENCOUNTER — Other Ambulatory Visit: Payer: Self-pay | Admitting: Internal Medicine

## 2017-05-11 DIAGNOSIS — Z1231 Encounter for screening mammogram for malignant neoplasm of breast: Secondary | ICD-10-CM

## 2017-06-06 ENCOUNTER — Ambulatory Visit
Admission: RE | Admit: 2017-06-06 | Discharge: 2017-06-06 | Disposition: A | Discharge status: Home / Self Care | Payer: Medicare HMO | Source: Ambulatory Visit | Attending: Internal Medicine | Admitting: Internal Medicine

## 2017-06-06 DIAGNOSIS — Z1231 Encounter for screening mammogram for malignant neoplasm of breast: Secondary | ICD-10-CM

## 2017-06-07 ENCOUNTER — Other Ambulatory Visit: Payer: Self-pay | Admitting: Internal Medicine

## 2017-06-07 DIAGNOSIS — R921 Mammographic calcification found on diagnostic imaging of breast: Secondary | ICD-10-CM

## 2017-06-12 ENCOUNTER — Ambulatory Visit
Admission: RE | Admit: 2017-06-12 | Discharge: 2017-06-12 | Disposition: A | Discharge status: Home / Self Care | Payer: Medicare HMO | Source: Ambulatory Visit | Attending: Internal Medicine | Admitting: Internal Medicine

## 2017-06-12 ENCOUNTER — Other Ambulatory Visit: Payer: Self-pay | Admitting: Internal Medicine

## 2017-06-12 DIAGNOSIS — R921 Mammographic calcification found on diagnostic imaging of breast: Secondary | ICD-10-CM

## 2017-06-16 ENCOUNTER — Other Ambulatory Visit: Payer: Self-pay | Admitting: Internal Medicine

## 2017-06-16 DIAGNOSIS — R921 Mammographic calcification found on diagnostic imaging of breast: Secondary | ICD-10-CM

## 2017-06-19 ENCOUNTER — Ambulatory Visit
Admission: RE | Admit: 2017-06-19 | Discharge: 2017-06-19 | Disposition: A | Discharge status: Home / Self Care | Payer: Medicare HMO | Source: Ambulatory Visit | Attending: Internal Medicine | Admitting: Internal Medicine

## 2017-06-19 DIAGNOSIS — R921 Mammographic calcification found on diagnostic imaging of breast: Secondary | ICD-10-CM | POA: Diagnosis not present

## 2017-06-19 DIAGNOSIS — N641 Fat necrosis of breast: Secondary | ICD-10-CM | POA: Diagnosis not present

## 2017-07-06 ENCOUNTER — Ambulatory Visit: Payer: Medicare HMO

## 2017-07-13 ENCOUNTER — Telehealth: Payer: Self-pay | Admitting: *Deleted

## 2017-07-13 ENCOUNTER — Ambulatory Visit (HOSPITAL_COMMUNITY)
Admission: EM | Admit: 2017-07-13 | Discharge: 2017-07-13 | Disposition: A | Discharge status: Home / Self Care | Payer: Medicare HMO | Attending: Family Medicine | Admitting: Family Medicine

## 2017-07-13 ENCOUNTER — Encounter (HOSPITAL_COMMUNITY): Payer: Self-pay | Admitting: Emergency Medicine

## 2017-07-13 ENCOUNTER — Other Ambulatory Visit: Payer: Self-pay

## 2017-07-13 DIAGNOSIS — R0789 Other chest pain: Secondary | ICD-10-CM

## 2017-07-13 MED ORDER — RANITIDINE HCL 150 MG PO TABS: 150.0000 mg | ORAL_TABLET | Freq: Two times a day (BID) | ORAL | 0 refills | Status: DC
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
## 2017-07-13 NOTE — Telephone Encounter (Signed)
Noted  

## 2017-07-13 NOTE — Telephone Encounter (Signed)
Patient called and stated that she has been having chest pains unrelieved by Aleve. Some  Left arm pain. No SOB. States its on the left side of chest radiating to the middle. Has had for 2 days. Uncomfortable.  No availability in office this morning. Instructed patient to go ahead to the ER/Urgent Care. Patient agreed.

## 2017-07-13 NOTE — ED Triage Notes (Addendum)
Pt reports left sided chest pain that started on Tuesday night.  It is atypical in nature and not associated with any other symptoms except noticing a difference in feeling in her left hand as opposed to her right hand.  Pt states if she gets up and walks around and belches she will feel better.

## 2017-07-13 NOTE — Discharge Instructions (Addendum)
Please continue to take Aleeve for pain  Please begin zantac twice daily  Please go to emergency room if your chest pain is worsening, becoming more persistent or more frequent, developing nausea, vomiting, shortness of breath, numbness, dizziness or lightheadedness.

## 2017-07-21 ENCOUNTER — Other Ambulatory Visit: Payer: Medicare HMO

## 2017-07-21 DIAGNOSIS — I10 Essential (primary) hypertension: Secondary | ICD-10-CM

## 2017-07-21 DIAGNOSIS — Z79899 Other long term (current) drug therapy: Secondary | ICD-10-CM

## 2017-07-21 DIAGNOSIS — E034 Atrophy of thyroid (acquired): Secondary | ICD-10-CM | POA: Diagnosis not present

## 2017-07-21 DIAGNOSIS — E785 Hyperlipidemia, unspecified: Secondary | ICD-10-CM | POA: Diagnosis not present

## 2017-07-21 LAB — COMPLETE METABOLIC PANEL WITH GFR
AG Ratio: 1.8 (calc) (ref 1.0–2.5)
ALT: 14 U/L (ref 6–29)
AST: 13 U/L (ref 10–35)
Albumin: 4.3 g/dL (ref 3.6–5.1)
Alkaline phosphatase (APISO): 83 U/L (ref 33–130)
BILIRUBIN TOTAL: 0.7 mg/dL (ref 0.2–1.2)
BUN: 12 mg/dL (ref 7–25)
CALCIUM: 9.2 mg/dL (ref 8.6–10.4)
CHLORIDE: 106 mmol/L (ref 98–110)
CO2: 26 mmol/L (ref 20–32)
Creat: 0.93 mg/dL (ref 0.60–0.93)
GFR, EST AFRICAN AMERICAN: 70 mL/min/{1.73_m2} (ref >=60)
GFR, EST NON AFRICAN AMERICAN: 60 mL/min/{1.73_m2} (ref >=60)
GLUCOSE: 97 mg/dL (ref 65–99)
Globulin: 2.4 g/dL (calc) (ref 1.9–3.7)
Potassium: 3.6 mmol/L (ref 3.5–5.3)
Sodium: 141 mmol/L (ref 135–146)
TOTAL PROTEIN: 6.7 g/dL (ref 6.1–8.1)

## 2017-07-21 LAB — CBC WITH DIFFERENTIAL/PLATELET
BASOS ABS: 82 {cells}/uL (ref 0–200)
Basophils Relative: 1 %
EOS PCT: 2.6 %
Eosinophils Absolute: 213 cells/uL (ref 15–500)
HEMATOCRIT: 34.2 % — AB (ref 35.0–45.0)
Hemoglobin: 11.6 g/dL — ABNORMAL LOW (ref 11.7–15.5)
LYMPHS ABS: 2878 {cells}/uL (ref 850–3900)
MCH: 31.2 pg (ref 27.0–33.0)
MCHC: 33.9 g/dL (ref 32.0–36.0)
MCV: 91.9 fL (ref 80.0–100.0)
MONOS PCT: 7.1 %
MPV: 10.8 fL (ref 7.5–12.5)
NEUTROS PCT: 54.2 %
Neutro Abs: 4444 cells/uL (ref 1500–7800)
PLATELETS: 297 10*3/uL (ref 140–400)
RBC: 3.72 10*6/uL — ABNORMAL LOW (ref 3.80–5.10)
RDW: 12 % (ref 11.0–15.0)
TOTAL LYMPHOCYTE: 35.1 %
WBC mixed population: 582 cells/uL (ref 200–950)
WBC: 8.2 10*3/uL (ref 3.8–10.8)

## 2017-07-21 LAB — T4, FREE: Free T4: 1.3 ng/dL (ref 0.8–1.8)

## 2017-07-21 LAB — URINALYSIS, ROUTINE W REFLEX MICROSCOPIC
BILIRUBIN URINE: NEGATIVE
Bacteria, UA: NONE SEEN /HPF
Glucose, UA: NEGATIVE
Hgb urine dipstick: NEGATIVE
Hyaline Cast: NONE SEEN /LPF
NITRITE: NEGATIVE
PH: 7 (ref 5.0–8.0)
RBC / HPF: NONE SEEN /HPF (ref 0–2)
SPECIFIC GRAVITY, URINE: 1.022 (ref 1.001–1.03)
WBC, UA: >=60 /HPF — AB (ref 0–5)

## 2017-07-21 LAB — LIPID PANEL
CHOL/HDL RATIO: 3 (calc) (ref <5.0)
Cholesterol: 214 mg/dL — ABNORMAL HIGH (ref <200)
HDL: 72 mg/dL (ref >50)
LDL CHOLESTEROL (CALC): 119 mg/dL — AB
NON-HDL CHOLESTEROL (CALC): 142 mg/dL — AB (ref <130)
Triglycerides: 123 mg/dL (ref <150)

## 2017-07-21 LAB — TSH: TSH: 8.64 m[IU]/L — AB (ref 0.40–4.50)

## 2017-07-25 DIAGNOSIS — G8929 Other chronic pain: Secondary | ICD-10-CM | POA: Diagnosis not present

## 2017-07-25 DIAGNOSIS — Z791 Long term (current) use of non-steroidal anti-inflammatories (NSAID): Secondary | ICD-10-CM | POA: Diagnosis not present

## 2017-07-25 DIAGNOSIS — E039 Hypothyroidism, unspecified: Secondary | ICD-10-CM | POA: Diagnosis not present

## 2017-07-25 DIAGNOSIS — Z803 Family history of malignant neoplasm of breast: Secondary | ICD-10-CM | POA: Diagnosis not present

## 2017-07-25 DIAGNOSIS — I10 Essential (primary) hypertension: Secondary | ICD-10-CM | POA: Diagnosis not present

## 2017-07-25 DIAGNOSIS — E785 Hyperlipidemia, unspecified: Secondary | ICD-10-CM | POA: Diagnosis not present

## 2017-07-25 DIAGNOSIS — K219 Gastro-esophageal reflux disease without esophagitis: Secondary | ICD-10-CM | POA: Diagnosis not present

## 2017-07-25 DIAGNOSIS — M1990 Unspecified osteoarthritis, unspecified site: Secondary | ICD-10-CM | POA: Diagnosis not present

## 2017-07-25 DIAGNOSIS — Z7982 Long term (current) use of aspirin: Secondary | ICD-10-CM | POA: Diagnosis not present

## 2017-07-25 DIAGNOSIS — Z8249 Family history of ischemic heart disease and other diseases of the circulatory system: Secondary | ICD-10-CM | POA: Diagnosis not present

## 2017-07-26 ENCOUNTER — Encounter: Payer: Self-pay | Admitting: Internal Medicine

## 2017-07-26 ENCOUNTER — Ambulatory Visit (INDEPENDENT_AMBULATORY_CARE_PROVIDER_SITE_OTHER): Payer: Medicare HMO | Admitting: Internal Medicine

## 2017-07-26 ENCOUNTER — Ambulatory Visit: Payer: Medicare HMO

## 2017-07-26 VITALS — BP 108/60 | HR 69 | Temp 98.3°F | Resp 10 | Ht 67.0 in | Wt 188.0 lb

## 2017-07-26 DIAGNOSIS — Z0000 Encounter for general adult medical examination without abnormal findings: Secondary | ICD-10-CM

## 2017-07-26 DIAGNOSIS — R0789 Other chest pain: Secondary | ICD-10-CM | POA: Diagnosis not present

## 2017-07-26 DIAGNOSIS — Z79899 Other long term (current) drug therapy: Secondary | ICD-10-CM

## 2017-07-26 DIAGNOSIS — E785 Hyperlipidemia, unspecified: Secondary | ICD-10-CM | POA: Diagnosis not present

## 2017-07-26 DIAGNOSIS — E034 Atrophy of thyroid (acquired): Secondary | ICD-10-CM | POA: Diagnosis not present

## 2017-07-26 DIAGNOSIS — Z1211 Encounter for screening for malignant neoplasm of colon: Secondary | ICD-10-CM

## 2017-07-26 DIAGNOSIS — I10 Essential (primary) hypertension: Secondary | ICD-10-CM

## 2017-07-26 MED ORDER — LEVOTHYROXINE SODIUM 125 MCG PO TABS: 125.0000 ug | ORAL_TABLET | Freq: Every day | ORAL | 6 refills | Status: DC
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
## 2017-07-26 NOTE — Patient Instructions (Addendum)
INCREASE LEVOTHYROXINE 125MCG DAILY   Continue other medications as ordered  Repeat thyroid test in 6 weeks - PLEASE SCHEDULE LAB APPT FOR 6 WEEKS  Follow up with specialists as indicated  Follow up in 6 mos for thyroid, HTN, hyperlipidemia. Fasting labs prior to visit

## 2017-07-28 ENCOUNTER — Telehealth: Payer: Self-pay | Admitting: *Deleted

## 2017-07-28 ENCOUNTER — Other Ambulatory Visit: Payer: Self-pay

## 2017-07-28 ENCOUNTER — Ambulatory Visit (HOSPITAL_COMMUNITY): Payer: Medicare HMO | Attending: Cardiology

## 2017-07-28 DIAGNOSIS — I340 Nonrheumatic mitral (valve) insufficiency: Secondary | ICD-10-CM | POA: Insufficient documentation

## 2017-07-28 DIAGNOSIS — R0789 Other chest pain: Secondary | ICD-10-CM | POA: Diagnosis not present

## 2017-07-28 DIAGNOSIS — I10 Essential (primary) hypertension: Secondary | ICD-10-CM | POA: Diagnosis not present

## 2017-07-28 DIAGNOSIS — E7800 Pure hypercholesterolemia, unspecified: Secondary | ICD-10-CM | POA: Diagnosis not present

## 2017-07-28 DIAGNOSIS — E039 Hypothyroidism, unspecified: Secondary | ICD-10-CM | POA: Diagnosis not present

## 2017-07-28 NOTE — Telephone Encounter (Signed)
Maybe she is having overactive bladder symptoms - is she able to come by office and get samples of myrbetriq 25mg  daily? If not, please call in #[phone] with 3RF. Her urine was negative for UTI last week.

## 2017-07-28 NOTE — Telephone Encounter (Signed)
Patient notified and agreed. Will come by and pick up samples and will call us if they work so we can call in a Rx.  Medication list updated.

## 2017-07-28 NOTE — Telephone Encounter (Signed)
error 

## 2017-07-28 NOTE — Telephone Encounter (Signed)
Patient called and stated that she was just seen on 07/26/17. Patient is having pressure in her bladder with some Frequency. Patient stated that the Pressure has gradually worsened since yesterday. No fever. Patient is wanting to know if you can call her something into her pharmacy. Please Advise.

## 2017-08-01 ENCOUNTER — Other Ambulatory Visit: Payer: Self-pay

## 2017-08-01 DIAGNOSIS — R931 Abnormal findings on diagnostic imaging of heart and coronary circulation: Secondary | ICD-10-CM

## 2017-08-04 NOTE — Addendum Note (Signed)
Addended by: Gildardo Cranker on: 08/04/2017 02:33 PM   Modules accepted: Orders

## 2017-08-11 ENCOUNTER — Encounter: Payer: Self-pay | Admitting: Cardiovascular Disease

## 2017-08-11 ENCOUNTER — Ambulatory Visit: Payer: Medicare HMO | Admitting: Cardiovascular Disease

## 2017-08-11 DIAGNOSIS — I10 Essential (primary) hypertension: Secondary | ICD-10-CM

## 2017-08-11 DIAGNOSIS — E785 Hyperlipidemia, unspecified: Secondary | ICD-10-CM

## 2017-08-11 NOTE — Patient Instructions (Signed)
Medication Instructions: Your physician recommends that you continue on your current medications as directed. Please refer to the Current Medication list given to you today.   Follow-Up: Your physician recommends that you schedule a follow-up appointment as needed with Dr. Berry.    

## 2017-08-11 NOTE — Assessment & Plan Note (Signed)
History of hyperlipidemia not on statin therapy followed by her PCP 

## 2017-08-11 NOTE — Progress Notes (Signed)
08/11/2017 Tamara Larson   07-Jun-1940  [id number]  Primary Physician Gildardo Cranker, DO Primary Cardiologist: Lorretta Harp MD Lupe Carney, [place]  HPI:  Tamara Larson is a 77 y.o.  moderately overweight widowed African-American female mother of one child, grandmother of 3 grandchildren referred by Dr. Amalia Hailey for peripheral vascular evaluation because of absent pedal pulses on exam.  I last saw her in the office 08/26/2016.  She has a history of treated hypertension and hyperlipidemia. She was apparently seen for ingrown toenails and apparently feel pulses were not able to be felt although she denies claudication. She had Dopplers that showed normal ABIs. He she does get some mild swelling in her legs on occasion which improved with compression stockings She was recently seen in the emergency room for atypical chest pain thought to be related to reflux and was treated with a antacid which has improved her symptoms.  She also had a 2D echocardiogram performed 07/28/2017 which was essentially normal except for some calcified mitral leaflets without evidence of stenosis or regurgitation.    Current Meds  Medication Sig  . albuterol (PROVENTIL HFA;VENTOLIN HFA) 108 (90 Base) MCG/ACT inhaler Inhale 2 puffs into the lungs every 6 (six) hours as needed for wheezing or shortness of breath.  Marland Kitchen amLODipine (NORVASC) 10 MG tablet TAKE 1 TABLET (10 MG TOTAL) BY MOUTH DAILY.  Marland Kitchen aspirin 81 MG tablet Take 81 mg by mouth daily.  Marland Kitchen levothyroxine (SYNTHROID, LEVOTHROID) 125 MCG tablet Take 1 tablet (125 mcg total) by mouth daily before breakfast.  . losartan (COZAAR) 100 MG tablet TAKE ONE TABLET BY MOUTH ONCE DAILY  . mirabegron ER (MYRBETRIQ) 25 MG TB24 tablet Take 25 mg by mouth daily.  . Multiple Vitamins-Minerals (CENTRUM MULTIGUMMIES PO) Take by mouth daily.  . naproxen sodium (ANAPROX) 220 MG tablet Take 220 mg by mouth as needed.  . ranitidine (ZANTAC) 150 MG tablet Take 1 tablet (150 mg  total) by mouth 2 (two) times daily for 15 days.  . simvastatin (ZOCOR) 20 MG tablet Take 20 mg by mouth daily.     Allergies  Allergen Reactions  . Shellfish Allergy Swelling    Social History   Socioeconomic History  . Marital status: Widowed    Spouse name: Not on file  . Number of children: Not on file  . Years of education: Not on file  . Highest education level: Not on file  Occupational History  . Not on file  Social Needs  . Financial resource strain: Not on file  . Food insecurity:    Worry: Not on file    Inability: Not on file  . Transportation needs:    Medical: Not on file    Non-medical: Not on file  Tobacco Use  . Smoking status: Former Smoker    Packs/day: 0.00  . Smokeless tobacco: Never Used  . Tobacco comment: Quit about age 67   Substance and Sexual Activity  . Alcohol use: No    Alcohol/week: 0.0 oz  . Drug use: No  . Sexual activity: Never    Birth control/protection: Post-menopausal, Surgical    Comment: 1st intercourse 77 yo-Fewer than 5 partners  Lifestyle  . Physical activity:    Days per week: Not on file    Minutes per session: Not on file  . Stress: Not on file  Relationships  . Social connections:    Talks on phone: Not on file    Gets together: Not on file  Attends religious service: Not on file    Active member of club or organization: Not on file    Attends meetings of clubs or organizations: Not on file    Relationship status: Not on file  . Intimate partner violence:    Fear of current or ex partner: Not on file    Emotionally abused: Not on file    Physically abused: Not on file    Forced sexual activity: Not on file  Other Topics Concern  . Not on file  Social History Narrative   Do you drink/eat things with caffeine? Yes, coffee   Was married x 26 years, widow   Lives in a one stories house, one person, no pets   Past/Current profession- Sales executive   Patient exercises daily      Review of  Systems: General: negative for chills, fever, night sweats or weight changes.  Cardiovascular: negative for chest pain, dyspnea on exertion, edema, orthopnea, palpitations, paroxysmal nocturnal dyspnea or shortness of breath Dermatological: negative for rash Respiratory: negative for cough or wheezing Urologic: negative for hematuria Abdominal: negative for nausea, vomiting, diarrhea, bright red blood per rectum, melena, or hematemesis Neurologic: negative for visual changes, syncope, or dizziness All other systems reviewed and are otherwise negative except as noted above.    Blood pressure 138/77, pulse 73, height 5\' 7"  (1.702 m), weight 188 lb 6.4 oz (85.5 kg).  General appearance: alert and no distress Neck: no adenopathy, no carotid bruit, no JVD, supple, symmetrical, trachea midline and thyroid not enlarged, symmetric, no tenderness/mass/nodules Lungs: clear to auscultation bilaterally Heart: regular rate and rhythm, S1, S2 normal, no murmur, click, rub or gallop Extremities: extremities normal, atraumatic, no cyanosis or edema Pulses: 2+ and symmetric Skin: Skin color, texture, turgor normal. No rashes or lesions Neurologic: Alert and oriented X 3, normal strength and tone. Normal symmetric reflexes. Normal coordination and gait  EKG not performed today  ASSESSMENT AND PLAN:   HTN (hypertension) History of essential hypertension her blood pressure measured at 138/77.  She is on amlodipine and losartan.  Continue current meds at current dosing.  Hyperlipidemia LDL goal <100 History of hyperlipidemia not on statin therapy followed by her PCP      Lorretta Harp MD [facility], [facility] 08/11/2017 9:38 AM

## 2017-08-11 NOTE — Assessment & Plan Note (Signed)
History of essential hypertension her blood pressure measured at 138/77.  She is on amlodipine and losartan.  Continue current meds at current dosing.

## 2017-08-23 ENCOUNTER — Ambulatory Visit (INDEPENDENT_AMBULATORY_CARE_PROVIDER_SITE_OTHER): Payer: Medicare HMO | Admitting: Family

## 2017-08-23 ENCOUNTER — Ambulatory Visit (INDEPENDENT_AMBULATORY_CARE_PROVIDER_SITE_OTHER): Payer: Self-pay

## 2017-08-23 ENCOUNTER — Encounter (INDEPENDENT_AMBULATORY_CARE_PROVIDER_SITE_OTHER): Payer: Self-pay | Admitting: Family

## 2017-08-23 VITALS — Ht 67.0 in | Wt 188.0 lb

## 2017-08-23 DIAGNOSIS — M5417 Radiculopathy, lumbosacral region: Secondary | ICD-10-CM | POA: Diagnosis not present

## 2017-08-23 DIAGNOSIS — M7918 Myalgia, other site: Secondary | ICD-10-CM

## 2017-08-23 MED ORDER — PREDNISONE 50 MG PO TABS: ORAL_TABLET | ORAL | 0 refills | Status: DC
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
## 2017-09-05 DIAGNOSIS — Z1211 Encounter for screening for malignant neoplasm of colon: Secondary | ICD-10-CM | POA: Diagnosis not present

## 2017-09-06 ENCOUNTER — Ambulatory Visit: Payer: Medicare HMO

## 2017-09-06 ENCOUNTER — Other Ambulatory Visit: Payer: Medicare HMO

## 2017-09-06 DIAGNOSIS — Z79899 Other long term (current) drug therapy: Secondary | ICD-10-CM | POA: Diagnosis not present

## 2017-09-06 DIAGNOSIS — Z1211 Encounter for screening for malignant neoplasm of colon: Secondary | ICD-10-CM

## 2017-09-06 DIAGNOSIS — E034 Atrophy of thyroid (acquired): Secondary | ICD-10-CM | POA: Diagnosis not present

## 2017-09-06 DIAGNOSIS — E785 Hyperlipidemia, unspecified: Secondary | ICD-10-CM | POA: Diagnosis not present

## 2017-09-06 LAB — COMPLETE METABOLIC PANEL WITH GFR
AG Ratio: 1.5 (calc) (ref 1.0–2.5)
ALBUMIN MSPROF: 3.9 g/dL (ref 3.6–5.1)
ALKALINE PHOSPHATASE (APISO): 96 U/L (ref 33–130)
ALT: 12 U/L (ref 6–29)
AST: 10 U/L (ref 10–35)
BILIRUBIN TOTAL: 0.7 mg/dL (ref 0.2–1.2)
BUN/Creatinine Ratio: 18 (calc) (ref 6–22)
BUN: 18 mg/dL (ref 7–25)
CO2: 27 mmol/L (ref 20–32)
Calcium: 9.1 mg/dL (ref 8.6–10.4)
Chloride: 106 mmol/L (ref 98–110)
Creat: 1 mg/dL — ABNORMAL HIGH (ref 0.60–0.93)
GFR, Est African American: 64 mL/min/{1.73_m2} (ref >=60)
GFR, Est Non African American: 55 mL/min/{1.73_m2} — ABNORMAL LOW (ref >=60)
GLOBULIN: 2.6 g/dL (ref 1.9–3.7)
GLUCOSE: 92 mg/dL (ref 65–99)
POTASSIUM: 3.7 mmol/L (ref 3.5–5.3)
Sodium: 141 mmol/L (ref 135–146)
Total Protein: 6.5 g/dL (ref 6.1–8.1)

## 2017-09-06 LAB — LIPID PANEL
Cholesterol: 217 mg/dL — ABNORMAL HIGH (ref <200)
HDL: 66 mg/dL (ref >50)
LDL CHOLESTEROL (CALC): 129 mg/dL — AB
Non-HDL Cholesterol (Calc): 151 mg/dL (calc) — ABNORMAL HIGH (ref <130)
TRIGLYCERIDES: 114 mg/dL (ref <150)
Total CHOL/HDL Ratio: 3.3 (calc) (ref <5.0)

## 2017-09-06 LAB — TSH: TSH: 0.53 m[IU]/L (ref 0.40–4.50)

## 2017-09-07 LAB — FECAL GLOBIN BY IMMUNOCHEM,MEDICARE
FECAL GLOBIN RESULT: NOT DETECTED
MICRO NUMBER:: 90817079
SPECIMEN QUALITY:: ADEQUATE

## 2017-09-28 ENCOUNTER — Encounter: Payer: Self-pay | Admitting: Internal Medicine

## 2017-10-07 ENCOUNTER — Other Ambulatory Visit: Payer: Self-pay | Admitting: Internal Medicine

## 2017-10-18 ENCOUNTER — Encounter: Payer: Self-pay | Admitting: Internal Medicine

## 2017-11-08 ENCOUNTER — Other Ambulatory Visit: Payer: Self-pay | Admitting: Internal Medicine

## 2017-11-29 DIAGNOSIS — R69 Illness, unspecified: Secondary | ICD-10-CM | POA: Diagnosis not present

## 2017-11-30 DIAGNOSIS — R69 Illness, unspecified: Secondary | ICD-10-CM | POA: Diagnosis not present

## 2017-12-13 ENCOUNTER — Telehealth (INDEPENDENT_AMBULATORY_CARE_PROVIDER_SITE_OTHER): Payer: Self-pay | Admitting: Orthopedic Surgery

## 2017-12-13 NOTE — Telephone Encounter (Signed)
Knee injection patient stated she is also having hip pain would like in injection in her hip as well

## 2017-12-14 NOTE — Telephone Encounter (Signed)
Scheduled patient appointment for 12/21/17 @10 :15am

## 2017-12-14 NOTE — Telephone Encounter (Signed)
Can you call pt and make appt so that these things can be discussed with Dr. Sharol Given.

## 2017-12-21 ENCOUNTER — Telehealth (INDEPENDENT_AMBULATORY_CARE_PROVIDER_SITE_OTHER): Payer: Self-pay

## 2017-12-21 ENCOUNTER — Encounter (INDEPENDENT_AMBULATORY_CARE_PROVIDER_SITE_OTHER): Payer: Self-pay | Admitting: Orthopedic Surgery

## 2017-12-21 ENCOUNTER — Ambulatory Visit (INDEPENDENT_AMBULATORY_CARE_PROVIDER_SITE_OTHER): Payer: Medicare HMO | Admitting: Physician Assistant

## 2017-12-21 VITALS — Ht 67.0 in | Wt 188.0 lb

## 2017-12-21 DIAGNOSIS — M1711 Unilateral primary osteoarthritis, right knee: Secondary | ICD-10-CM | POA: Diagnosis not present

## 2017-12-21 DIAGNOSIS — M5417 Radiculopathy, lumbosacral region: Secondary | ICD-10-CM | POA: Diagnosis not present

## 2017-12-21 MED ORDER — PREDNISONE 10 MG PO TABS: 20.0000 mg | ORAL_TABLET | Freq: Every day | ORAL | 0 refills | Status: DC

## 2017-12-21 NOTE — Telephone Encounter (Signed)
Noted  

## 2017-12-21 NOTE — Telephone Encounter (Signed)
-----   Message from [place], [place] sent at 12/21/2017 12:30 PM EDT ----- Regarding: Synvisc injection Patient would like to get approval for another Synvisc injection to the right knee.

## 2017-12-21 NOTE — Telephone Encounter (Signed)
Pt s/p right knee synvisc injection 5/20198. Pt would like repeat injection

## 2017-12-22 ENCOUNTER — Encounter (INDEPENDENT_AMBULATORY_CARE_PROVIDER_SITE_OTHER): Payer: Self-pay | Admitting: Physician Assistant
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
## 2017-12-25 ENCOUNTER — Encounter: Payer: Self-pay | Admitting: Gastroenterology

## 2018-01-01 ENCOUNTER — Telehealth (INDEPENDENT_AMBULATORY_CARE_PROVIDER_SITE_OTHER): Payer: Self-pay

## 2018-01-01 NOTE — Telephone Encounter (Signed)
Submitted VOB for SynviscOne, right knee. 

## 2018-01-03 ENCOUNTER — Ambulatory Visit
Admission: RE | Admit: 2018-01-03 | Discharge: 2018-01-03 | Disposition: A | Discharge status: Home / Self Care | Payer: Medicare HMO | Source: Ambulatory Visit | Attending: Physician Assistant | Admitting: Physician Assistant

## 2018-01-03 DIAGNOSIS — M5417 Radiculopathy, lumbosacral region: Secondary | ICD-10-CM

## 2018-01-03 DIAGNOSIS — M48061 Spinal stenosis, lumbar region without neurogenic claudication: Secondary | ICD-10-CM | POA: Diagnosis not present

## 2018-01-09 DIAGNOSIS — H5201 Hypermetropia, right eye: Secondary | ICD-10-CM | POA: Diagnosis not present

## 2018-01-09 DIAGNOSIS — H5212 Myopia, left eye: Secondary | ICD-10-CM | POA: Diagnosis not present

## 2018-01-09 DIAGNOSIS — Z961 Presence of intraocular lens: Secondary | ICD-10-CM | POA: Diagnosis not present

## 2018-01-09 DIAGNOSIS — H26492 Other secondary cataract, left eye: Secondary | ICD-10-CM | POA: Diagnosis not present

## 2018-01-09 DIAGNOSIS — H52203 Unspecified astigmatism, bilateral: Secondary | ICD-10-CM | POA: Diagnosis not present

## 2018-01-09 DIAGNOSIS — H2511 Age-related nuclear cataract, right eye: Secondary | ICD-10-CM | POA: Diagnosis not present

## 2018-01-09 DIAGNOSIS — H524 Presbyopia: Secondary | ICD-10-CM | POA: Diagnosis not present

## 2018-01-09 DIAGNOSIS — H25011 Cortical age-related cataract, right eye: Secondary | ICD-10-CM | POA: Diagnosis not present

## 2018-01-10 ENCOUNTER — Other Ambulatory Visit (INDEPENDENT_AMBULATORY_CARE_PROVIDER_SITE_OTHER): Payer: Self-pay | Admitting: Physician Assistant

## 2018-01-10 ENCOUNTER — Telehealth (INDEPENDENT_AMBULATORY_CARE_PROVIDER_SITE_OTHER): Payer: Self-pay

## 2018-01-10 NOTE — Telephone Encounter (Signed)
Pt had an MRI L spine 01/03/18 needs review afterwards please advise if refill on prednisone is needed or referral for [facility]

## 2018-01-10 NOTE — Telephone Encounter (Signed)
PA required for SynviscOne, right knee. Faxed completed PA form to [facility] at [phone].

## 2018-01-11 ENCOUNTER — Telehealth (INDEPENDENT_AMBULATORY_CARE_PROVIDER_SITE_OTHER): Payer: Self-pay

## 2018-01-11 NOTE — Telephone Encounter (Signed)
Patient is approved for SynviscOne, right knee. [facility] Patient will be responsible for 20% OOP $25.00 Co-pay PA required PA Approval # [id number] Valid 01/10/2018- 04/12/2018  Appt. 01/18/2018 with Dr. Sharol Given

## 2018-01-17 DIAGNOSIS — H25011 Cortical age-related cataract, right eye: Secondary | ICD-10-CM | POA: Diagnosis not present

## 2018-01-17 DIAGNOSIS — H2511 Age-related nuclear cataract, right eye: Secondary | ICD-10-CM | POA: Diagnosis not present

## 2018-01-18 ENCOUNTER — Ambulatory Visit (INDEPENDENT_AMBULATORY_CARE_PROVIDER_SITE_OTHER): Payer: Medicare HMO | Admitting: Orthopedic Surgery

## 2018-01-22 ENCOUNTER — Ambulatory Visit (INDEPENDENT_AMBULATORY_CARE_PROVIDER_SITE_OTHER): Payer: Medicare HMO | Admitting: Physician Assistant

## 2018-01-22 VITALS — Ht 67.0 in | Wt 188.0 lb

## 2018-01-22 DIAGNOSIS — M5417 Radiculopathy, lumbosacral region: Secondary | ICD-10-CM

## 2018-01-22 DIAGNOSIS — M1711 Unilateral primary osteoarthritis, right knee: Secondary | ICD-10-CM | POA: Diagnosis not present

## 2018-01-24 ENCOUNTER — Encounter (INDEPENDENT_AMBULATORY_CARE_PROVIDER_SITE_OTHER): Payer: Self-pay | Admitting: Physician Assistant

## 2018-01-24 DIAGNOSIS — M1711 Unilateral primary osteoarthritis, right knee: Secondary | ICD-10-CM

## 2018-01-24 DIAGNOSIS — H26492 Other secondary cataract, left eye: Secondary | ICD-10-CM | POA: Diagnosis not present

## 2018-01-24 DIAGNOSIS — M5417 Radiculopathy, lumbosacral region: Secondary | ICD-10-CM | POA: Diagnosis not present

## 2018-01-24 MED ORDER — HYLAN G-F 20 48 MG/6ML IX SOSY
48.0000 mg | PREFILLED_SYRINGE | INTRA_ARTICULAR | Status: AC | PRN
  Administered 2018-01-24: 48 mg via INTRA_ARTICULAR

## 2018-01-24 MED ORDER — LIDOCAINE HCL 1 % IJ SOLN
1.0000 mL | INTRAMUSCULAR | Status: AC | PRN
  Administered 2018-01-24: 1 mL
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
## 2018-01-30 ENCOUNTER — Encounter: Payer: Self-pay | Admitting: Nurse Practitioner

## 2018-01-30 ENCOUNTER — Ambulatory Visit: Payer: Medicare HMO | Admitting: Internal Medicine

## 2018-01-30 ENCOUNTER — Ambulatory Visit (INDEPENDENT_AMBULATORY_CARE_PROVIDER_SITE_OTHER): Payer: Medicare HMO | Admitting: Nurse Practitioner

## 2018-01-30 VITALS — BP 122/74 | HR 90 | Temp 98.1°F | Resp 10 | Ht 67.0 in | Wt 193.0 lb

## 2018-01-30 DIAGNOSIS — M1990 Unspecified osteoarthritis, unspecified site: Secondary | ICD-10-CM | POA: Diagnosis not present

## 2018-01-30 DIAGNOSIS — E034 Atrophy of thyroid (acquired): Secondary | ICD-10-CM | POA: Diagnosis not present

## 2018-01-30 DIAGNOSIS — I10 Essential (primary) hypertension: Secondary | ICD-10-CM | POA: Diagnosis not present

## 2018-01-30 DIAGNOSIS — K219 Gastro-esophageal reflux disease without esophagitis: Secondary | ICD-10-CM | POA: Diagnosis not present

## 2018-01-30 DIAGNOSIS — Z23 Encounter for immunization: Secondary | ICD-10-CM | POA: Diagnosis not present

## 2018-01-30 DIAGNOSIS — E785 Hyperlipidemia, unspecified: Secondary | ICD-10-CM | POA: Diagnosis not present
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
## 2018-01-30 NOTE — Patient Instructions (Addendum)

## 2018-01-31 ENCOUNTER — Other Ambulatory Visit: Payer: Self-pay

## 2018-01-31 ENCOUNTER — Telehealth: Payer: Self-pay | Admitting: *Deleted

## 2018-01-31 DIAGNOSIS — D729 Disorder of white blood cells, unspecified: Secondary | ICD-10-CM

## 2018-01-31 DIAGNOSIS — E034 Atrophy of thyroid (acquired): Secondary | ICD-10-CM

## 2018-01-31 LAB — CBC WITH DIFFERENTIAL/PLATELET
Basophils Absolute: 89 cells/uL (ref 0–200)
Basophils Relative: 0.5 %
Eosinophils Absolute: 107 cells/uL (ref 15–500)
Eosinophils Relative: 0.6 %
HCT: 36.7 % (ref 35.0–45.0)
Hemoglobin: 12.4 g/dL (ref 11.7–15.5)
LYMPHS ABS: 4041 {cells}/uL — AB (ref 850–3900)
MCH: 30.5 pg (ref 27.0–33.0)
MCHC: 33.8 g/dL (ref 32.0–36.0)
MCV: 90.4 fL (ref 80.0–100.0)
MPV: 10.9 fL (ref 7.5–12.5)
Monocytes Relative: 6.8 %
NEUTROS PCT: 69.4 %
Neutro Abs: 12353 cells/uL — ABNORMAL HIGH (ref 1500–7800)
Platelets: 300 10*3/uL (ref 140–400)
RBC: 4.06 10*6/uL (ref 3.80–5.10)
RDW: 12.6 % (ref 11.0–15.0)
Total Lymphocyte: 22.7 %
WBC mixed population: 1210 cells/uL — ABNORMAL HIGH (ref 200–950)
WBC: 17.8 10*3/uL — AB (ref 3.8–10.8)

## 2018-01-31 LAB — COMPLETE METABOLIC PANEL WITH GFR
AG Ratio: 1.5 (calc) (ref 1.0–2.5)
ALT: 11 U/L (ref 6–29)
AST: 9 U/L — ABNORMAL LOW (ref 10–35)
Albumin: 4 g/dL (ref 3.6–5.1)
Alkaline phosphatase (APISO): 89 U/L (ref 33–130)
BUN: 17 mg/dL (ref 7–25)
CO2: 29 mmol/L (ref 20–32)
Calcium: 9.4 mg/dL (ref 8.6–10.4)
Chloride: 105 mmol/L (ref 98–110)
Creat: 0.78 mg/dL (ref 0.60–0.93)
GFR, EST AFRICAN AMERICAN: 86 mL/min/{1.73_m2} (ref >=60)
GFR, EST NON AFRICAN AMERICAN: 74 mL/min/{1.73_m2} (ref >=60)
Globulin: 2.7 g/dL (calc) (ref 1.9–3.7)
Glucose, Bld: 93 mg/dL (ref 65–99)
Potassium: 4 mmol/L (ref 3.5–5.3)
Sodium: 141 mmol/L (ref 135–146)
TOTAL PROTEIN: 6.7 g/dL (ref 6.1–8.1)
Total Bilirubin: 0.9 mg/dL (ref 0.2–1.2)

## 2018-01-31 LAB — LIPID PANEL
Cholesterol: 239 mg/dL — ABNORMAL HIGH (ref <200)
HDL: 86 mg/dL (ref >50)
LDL CHOLESTEROL (CALC): 132 mg/dL — AB
Non-HDL Cholesterol (Calc): 153 mg/dL (calc) — ABNORMAL HIGH (ref <130)
Total CHOL/HDL Ratio: 2.8 (calc) (ref <5.0)
Triglycerides: 105 mg/dL (ref <150)

## 2018-01-31 LAB — TSH: TSH: 0.04 mIU/L — ABNORMAL LOW (ref 0.40–4.50)

## 2018-01-31 MED ORDER — LEVOTHYROXINE SODIUM 112 MCG PO TABS: 112.0000 ug | ORAL_TABLET | Freq: Every day | ORAL | 1 refills | Status: DC

## 2018-01-31 NOTE — Telephone Encounter (Signed)
Patient called and left message on Clinical intake and stated that she was confused regarding the dosage of thyroid medication to be taking. Stated that in [place] Dr. Eulas Post placed her on 142mcg and then she had bloodwork done and increased it to 18mcg and now it is being changed again to 146mcg.   Returned call to patient and had to [facility] to return call. Awaiting callback.

## 2018-01-31 NOTE — Addendum Note (Signed)
Addended by: Logan Bores on: 01/31/2018 11:53 AM   Modules accepted: Orders

## 2018-01-31 NOTE — Telephone Encounter (Signed)
Explained the Thyroid dosage and lab numbers with patient. Patient was concerned with wasting medication.   Patient understands and agrees.

## 2018-02-06 ENCOUNTER — Other Ambulatory Visit: Payer: Medicare HMO

## 2018-02-06 DIAGNOSIS — D729 Disorder of white blood cells, unspecified: Secondary | ICD-10-CM | POA: Diagnosis not present

## 2018-02-06 LAB — CBC WITH DIFFERENTIAL/PLATELET
Basophils Absolute: 74 cells/uL (ref 0–200)
Basophils Relative: 0.7 %
Eosinophils Absolute: 336 cells/uL (ref 15–500)
Eosinophils Relative: 3.2 %
HCT: 36.7 % (ref 35.0–45.0)
Hemoglobin: 12 g/dL (ref 11.7–15.5)
Lymphs Abs: 3077 cells/uL (ref 850–3900)
MCH: 30.2 pg (ref 27.0–33.0)
MCHC: 32.7 g/dL (ref 32.0–36.0)
MCV: 92.4 fL (ref 80.0–100.0)
MONOS PCT: 6.8 %
MPV: 11.3 fL (ref 7.5–12.5)
Neutro Abs: 6300 cells/uL (ref 1500–7800)
Neutrophils Relative %: 60 %
Platelets: 301 10*3/uL (ref 140–400)
RBC: 3.97 10*6/uL (ref 3.80–5.10)
RDW: 12.6 % (ref 11.0–15.0)
Total Lymphocyte: 29.3 %
WBC mixed population: 714 cells/uL (ref 200–950)
WBC: 10.5 10*3/uL (ref 3.8–10.8)

## 2018-02-07 ENCOUNTER — Other Ambulatory Visit: Payer: Self-pay | Admitting: *Deleted

## 2018-02-07 MED ORDER — SIMVASTATIN 20 MG PO TABS: 20.0000 mg | ORAL_TABLET | Freq: Every day | ORAL | 1 refills | Status: DC

## 2018-02-07 NOTE — Telephone Encounter (Signed)
[facility] 

## 2018-02-27 DIAGNOSIS — Z961 Presence of intraocular lens: Secondary | ICD-10-CM | POA: Diagnosis not present

## 2018-03-02 ENCOUNTER — Telehealth: Payer: Self-pay | Admitting: *Deleted

## 2018-03-02 NOTE — Telephone Encounter (Signed)
She can try using a nettipot daily for sinus congestion  Plain nasal saline spray throughout the day as needed May use tylenol 325 mg 2 tablets every 6 hours as needed aches and pains or sore throat humidifier in the home to help with the dry air Mucinex DM by mouth twice daily as needed for cough and congestion with full glass of water  Keep well hydrated Avoid forcefully blowing nose Also would recommend flonase 1 spray into both nares daily for congestion If this does not help to follow up next week

## 2018-03-02 NOTE — Telephone Encounter (Signed)
Patient called and stated that she wants an antibiotic called in for Cold Sx she has had for one week now. Stated that the OTC medications are not working. Please Advise.   Patient called complaining of cold/upper respiratory/sinus symptoms  1. Fever? No, New Years eve had sweats 2. Chills? No 3. Myalgias? No 4. How long have you had your symptoms? 1 week 5. Are you coughing up mucus and if yes any discoloration? Yes, white 6. What have you done for your symptoms thus far? OTC severe sinus medication and Mucinex DM 7. Have you been around anyone sick? No   I will forward your response to your provider and call you with further instructions

## 2018-03-02 NOTE — Telephone Encounter (Signed)
Patient notified and agreed.  

## 2018-05-08 ENCOUNTER — Encounter: Payer: Self-pay | Admitting: Physician Assistant

## 2018-05-08 ENCOUNTER — Ambulatory Visit: Payer: Medicare HMO | Admitting: Physician Assistant

## 2018-05-08 VITALS — BP 146/80 | HR 82 | Ht 67.0 in | Wt 199.4 lb

## 2018-05-08 DIAGNOSIS — E785 Hyperlipidemia, unspecified: Secondary | ICD-10-CM | POA: Diagnosis not present

## 2018-05-08 DIAGNOSIS — I491 Atrial premature depolarization: Secondary | ICD-10-CM

## 2018-05-08 DIAGNOSIS — Z01818 Encounter for other preprocedural examination: Secondary | ICD-10-CM | POA: Diagnosis not present

## 2018-05-08 DIAGNOSIS — E039 Hypothyroidism, unspecified: Secondary | ICD-10-CM

## 2018-05-08 DIAGNOSIS — I10 Essential (primary) hypertension: Secondary | ICD-10-CM | POA: Diagnosis not present

## 2018-05-08 DIAGNOSIS — R072 Precordial pain: Secondary | ICD-10-CM | POA: Diagnosis not present

## 2018-05-08 MED ORDER — METOPROLOL TARTRATE 25 MG PO TABS: 12.5000 mg | ORAL_TABLET | Freq: Two times a day (BID) | ORAL | 0 refills | Status: DC

## 2018-05-08 NOTE — Patient Instructions (Addendum)
Medication Instructions:  START METOPROLOL TARTRATE 12.5 MG 2 TIMES A DAY. If you need a refill on your cardiac medications before your next appointment, please call your pharmacy.   Lab work: YOU WILL NEED TO HAVE LABS (BLOOD WORK) DRAWN 1 WEEK PRIOR TO THE PROCEDURE : BMET   If you have labs (blood work) drawn today and your tests are completely normal, you will receive your results only by: Marland Kitchen MyChart Message (if you have MyChart) OR . A paper copy in the mail If you have any lab test that is abnormal or we need to change your treatment, we will call you to review the results.  Testing/Procedures: Your physician has requested that you have cardiac CT. Cardiac computed tomography (CT) is a painless test that uses an x-ray machine to take clear, detailed pictures of your heart. For further information please visit [email or web address]. Please follow instruction sheet as given.     Follow-Up: Your physician recommends that you schedule a follow-up appointment AFTER YOUR PROCEDURE    Any Other Special Instructions Will Be Listed Below (If Applicable).  THE MORNING OF THE PROCEDURE TAKE 25 MG (2 HALF TABLETS) OF METOPROLOL TARTRATE (LOPRESSOR) 2 HOUR BEFORE TEST      Please arrive at the [facility] main entrance of [facility] at xx:xx AM (30-45 minutes prior to test start time)  [facility] [place], [place] [address] [phone]  Proceed to the [facility] Radiology Department (First Floor).  Please follow these instructions carefully (unless otherwise directed):   On the Night Before the Test: . Be sure to Drink plenty of water. . Do not consume any caffeinated/decaffeinated beverages or chocolate 12 hours prior to your test. . Do not take any antihistamines 12 hours prior to your test. . If you take Metformin do not take 24 hours prior to test. . If the patient has contrast allergy: ? Patient will need a prescription for Prednisone  and very clear instructions (as follows): 1. Prednisone 50 mg - take 13 hours prior to test 2. Take another Prednisone 50 mg 7 hours prior to test 3. Take another Prednisone 50 mg 1 hour prior to test 4. Take Benadryl 50 mg 1 hour prior to test . Patient must complete all four doses of above prophylactic medications. . Patient will need a ride after test due to Benadryl.  On the Day of the Test: . Drink plenty of water. Do not drink any water within one hour of the test. . Do not eat any food 4 hours prior to the test. . You may take your regular medications prior to the test.  . Take metoprolol (Lopressor) two hours prior to test. . HOLD Furosemide/Hydrochlorothiazide morning of the test.   *For Clinical Staff only. Please instruct patient the following:*        -Drink plenty of water       -Hold Furosemide/hydrochlorothiazide morning of the test       -Take metoprolol (Lopressor) 2 hours prior to test (if applicable).                  -If HR is less than 55 BPM- No Beta Blocker                -IF HR is greater than 55 BPM and patient is less than or equal to 12 yrs old Lopressor 100mg  x1.                -If  HR is greater than 55 BPM and patient is greater than 9 yrs old Lopressor 50 mg x1.     Do not give Lopressor to patients with an allergy to lopressor or anyone with asthma or active COPD symptoms (currently taking steroids).       After the Test: . Drink plenty of water. . After receiving IV contrast, you may experience a mild flushed feeling. This is normal. . On occasion, you may experience a mild rash up to 24 hours after the test. This is not dangerous. If this occurs, you can take Benadryl 25 mg and increase your fluid intake. . If you experience trouble breathing, this can be serious. If it is severe call 911 IMMEDIATELY. If it is mild, please call our office. . If you take any of these medications: Glipizide/Metformin, Avandament, Glucavance, please do not take 48  hours after completing test.    Cardiac CT Angiogram  A cardiac CT angiogram is a procedure to look at the heart and the area around the heart. It may be done to help find the cause of chest pains or other symptoms of heart disease. During this procedure, a large X-ray machine, called a CT scanner, takes detailed pictures of the heart and the surrounding area after a dye (contrast material) has been injected into blood vessels in the area. The procedure is also sometimes called a coronary CT angiogram, coronary artery scanning, or CTA. A cardiac CT angiogram allows the health care provider to see how well blood is flowing to and from the heart. The health care provider will be able to see if there are any problems, such as:  Blockage or narrowing of the coronary arteries in the heart.  Fluid around the heart.  Signs of weakness or disease in the muscles, valves, and tissues of the heart. Tell a health care provider about:  Any allergies you have. This is especially important if you have had a previous allergic reaction to contrast dye.  All medicines you are taking, including vitamins, herbs, eye drops, creams, and over-the-counter medicines.  Any blood disorders you have.  Any surgeries you have had.  Any medical conditions you have.  Whether you are pregnant or may be pregnant.  Any anxiety disorders, chronic pain, or other conditions you have that may increase your stress or prevent you from lying still. What are the risks? Generally, this is a safe procedure. However, problems may occur, including:  Bleeding.  Infection.  Allergic reactions to medicines or dyes.  Damage to other structures or organs.  Kidney damage from the dye or contrast that is used.  Increased risk of cancer from radiation exposure. This risk is low. Talk with your health care provider about: ? The risks and benefits of testing. ? How you can receive the lowest dose of radiation. What happens  before the procedure?  Wear comfortable clothing and remove any jewelry, glasses, dentures, and hearing aids.  Follow instructions from your health care provider about eating and drinking. This may include: ? For 12 hours before the test - avoid caffeine. This includes tea, coffee, soda, energy drinks, and diet pills. Drink plenty of water or other fluids that do not have caffeine in them. Being well-hydrated can prevent complications. ? For 4-6 hours before the test - stop eating and drinking. The contrast dye can cause nausea, but this is less likely if your stomach is empty.  Ask your health care provider about changing or stopping your regular medicines. This is  especially important if you are taking diabetes medicines, blood thinners, or medicines to treat erectile dysfunction. What happens during the procedure?  Hair on your chest may need to be removed so that small sticky patches called electrodes can be placed on your chest. These will transmit information that helps to monitor your heart during the test.  An IV tube will be inserted into one of your veins.  You might be given a medicine to control your heart rate during the test. This will help to ensure that good images are obtained.  You will be asked to lie on an exam table. This table will slide in and out of the CT machine during the procedure.  Contrast dye will be injected into the IV tube. You might feel warm, or you may get a metallic taste in your mouth.  You will be given a medicine (nitroglycerin) to relax (dilate) the arteries in your heart.  The table that you are lying on will move into the CT machine tunnel for the scan.  The person running the machine will give you instructions while the scans are being done. You may be asked to: ? Keep your arms above your head. ? Hold your breath. ? Stay very still, even if the table is moving.  When the scanning is complete, you will be moved out of the machine.  The IV  tube will be removed. The procedure may vary among health care providers and hospitals. What happens after the procedure?  You might feel warm, or you may get a metallic taste in your mouth from the contrast dye.  You may have a headache from the nitroglycerin.  After the procedure, drink water or other fluids to wash (flush) the contrast material out of your body.  Contact a health care provider if you have any symptoms of allergy to the contrast. These symptoms include: ? Shortness of breath. ? Rash or hives. ? A racing heartbeat.  Most people can return to their normal activities right after the procedure. Ask your health care provider what activities are safe for you.  It is up to you to get the results of your procedure. Ask your health care provider, or the department that is doing the procedure, when your results will be ready. Summary  A cardiac CT angiogram is a procedure to look at the heart and the area around the heart. It may be done to help find the cause of chest pains or other symptoms of heart disease.  During this procedure, a large X-ray machine, called a CT scanner, takes detailed pictures of the heart and the surrounding area after a dye (contrast material) has been injected into blood vessels in the area.  Ask your health care provider about changing or stopping your regular medicines before the procedure. This is especially important if you are taking diabetes medicines, blood thinners, or medicines to treat erectile dysfunction.  After the procedure, drink water or other fluids to wash (flush) the contrast material out of your body. This information is not intended to replace advice given to you by your health care provider. Make sure you discuss any questions you have with your health care provider. Document Released: 01/28/2008 Document Revised: 01/04/2016 Document Reviewed: 01/04/2016 Elsevier Interactive Patient Education  2019 [employer].
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
## 2018-05-10 ENCOUNTER — Encounter: Payer: Self-pay | Admitting: Physician Assistant

## 2018-05-14 ENCOUNTER — Other Ambulatory Visit: Payer: Self-pay | Admitting: *Deleted

## 2018-05-14 MED ORDER — LOSARTAN POTASSIUM 100 MG PO TABS: 100.0000 mg | ORAL_TABLET | Freq: Every day | ORAL | 0 refills | Status: DC

## 2018-05-14 MED ORDER — AMLODIPINE BESYLATE 10 MG PO TABS: 10.0000 mg | ORAL_TABLET | Freq: Every day | ORAL | 0 refills | Status: DC

## 2018-05-31 ENCOUNTER — Telehealth: Payer: Medicare HMO | Admitting: Physician Assistant

## 2018-06-24 ENCOUNTER — Other Ambulatory Visit (INDEPENDENT_AMBULATORY_CARE_PROVIDER_SITE_OTHER): Payer: Self-pay | Admitting: Nurse Practitioner

## 2018-06-29 ENCOUNTER — Other Ambulatory Visit: Payer: Self-pay | Admitting: Nurse Practitioner

## 2018-07-21 ENCOUNTER — Other Ambulatory Visit: Payer: Self-pay | Admitting: Nurse Practitioner

## 2018-07-30 ENCOUNTER — Other Ambulatory Visit: Payer: Self-pay | Admitting: Physician Assistant

## 2018-07-31 ENCOUNTER — Other Ambulatory Visit: Payer: Self-pay | Admitting: *Deleted

## 2018-07-31 ENCOUNTER — Ambulatory Visit (INDEPENDENT_AMBULATORY_CARE_PROVIDER_SITE_OTHER): Payer: Medicare HMO | Admitting: Nurse Practitioner

## 2018-07-31 ENCOUNTER — Encounter: Payer: Self-pay | Admitting: Nurse Practitioner

## 2018-07-31 ENCOUNTER — Ambulatory Visit: Payer: Medicare HMO | Admitting: Nurse Practitioner

## 2018-07-31 ENCOUNTER — Other Ambulatory Visit: Payer: Self-pay

## 2018-07-31 DIAGNOSIS — E785 Hyperlipidemia, unspecified: Secondary | ICD-10-CM

## 2018-07-31 DIAGNOSIS — E039 Hypothyroidism, unspecified: Secondary | ICD-10-CM

## 2018-07-31 DIAGNOSIS — M1990 Unspecified osteoarthritis, unspecified site: Secondary | ICD-10-CM

## 2018-07-31 DIAGNOSIS — R600 Localized edema: Secondary | ICD-10-CM | POA: Diagnosis not present

## 2018-07-31 DIAGNOSIS — I10 Essential (primary) hypertension: Secondary | ICD-10-CM | POA: Diagnosis not present

## 2018-07-31 DIAGNOSIS — E2839 Other primary ovarian failure: Secondary | ICD-10-CM | POA: Diagnosis not present

## 2018-07-31 DIAGNOSIS — K219 Gastro-esophageal reflux disease without esophagitis: Secondary | ICD-10-CM

## 2018-07-31 MED ORDER — SIMVASTATIN 20 MG PO TABS: ORAL_TABLET | ORAL | 1 refills | Status: DC
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
## 2018-07-31 NOTE — Telephone Encounter (Signed)
[facility] 

## 2018-07-31 NOTE — Patient Instructions (Signed)
Can use Tylenol 325 1-2 tablets as needed for pain    DASH Eating Plan DASH stands for "Dietary Approaches to Stop Hypertension." The DASH eating plan is a healthy eating plan that has been shown to reduce high blood pressure (hypertension). It may also reduce your risk for type 2 diabetes, heart disease, and stroke. The DASH eating plan may also help with weight loss. What are tips for following this plan?  General guidelines  Avoid eating more than 2,300 mg (milligrams) of salt (sodium) a day. If you have hypertension, you may need to reduce your sodium intake to 1,500 mg a day.  Limit alcohol intake to no more than 1 drink a day for nonpregnant women and 2 drinks a day for men. One drink equals 12 oz of beer, 5 oz of wine, or 1 oz of hard liquor.  Work with your health care provider to maintain a healthy body weight or to lose weight. Ask what an ideal weight is for you.  Get at least 30 minutes of exercise that causes your heart to beat faster (aerobic exercise) most days of the week. Activities may include walking, swimming, or biking.  Work with your health care provider or diet and nutrition specialist (dietitian) to adjust your eating plan to your individual calorie needs. Reading food labels   Check food labels for the amount of sodium per serving. Choose foods with less than 5 percent of the Daily Value of sodium. Generally, foods with less than 300 mg of sodium per serving fit into this eating plan.  To find whole grains, look for the word "whole" as the first word in the ingredient list. Shopping  Buy products labeled as "low-sodium" or "no salt added."  Buy fresh foods. Avoid canned foods and premade or frozen meals. Cooking  Avoid adding salt when cooking. Use salt-free seasonings or herbs instead of table salt or sea salt. Check with your health care provider or pharmacist before using salt substitutes.  Do not fry foods. Cook foods using healthy methods such as  baking, boiling, grilling, and broiling instead.  Cook with heart-healthy oils, such as olive, canola, soybean, or sunflower oil. Meal planning  Eat a balanced diet that includes: ? 5 or more servings of fruits and vegetables each day. At each meal, try to fill half of your plate with fruits and vegetables. ? Up to 6-8 servings of whole grains each day. ? Less than 6 oz of lean meat, poultry, or fish each day. A 3-oz serving of meat is about the same size as a deck of cards. One egg equals 1 oz. ? 2 servings of low-fat dairy each day. ? A serving of nuts, seeds, or beans 5 times each week. ? Heart-healthy fats. Healthy fats called Omega-3 fatty acids are found in foods such as flaxseeds and coldwater fish, like sardines, salmon, and mackerel.  Limit how much you eat of the following: ? Canned or prepackaged foods. ? Food that is high in trans fat, such as fried foods. ? Food that is high in saturated fat, such as fatty meat. ? Sweets, desserts, sugary drinks, and other foods with added sugar. ? Full-fat dairy products.  Do not salt foods before eating.  Try to eat at least 2 vegetarian meals each week.  Eat more home-cooked food and less restaurant, buffet, and fast food.  When eating at a restaurant, ask that your food be prepared with less salt or no salt, if possible. What foods are recommended? The  items listed may not be a complete list. Talk with your dietitian about what dietary choices are best for you. Grains Whole-grain or whole-wheat bread. Whole-grain or whole-wheat pasta. Brown rice. [place]ena Morrow. Bulgur. Whole-grain and low-sodium cereals. Pita bread. Low-fat, low-sodium crackers. Whole-wheat flour tortillas. Vegetables Fresh or frozen vegetables (raw, steamed, roasted, or grilled). Low-sodium or reduced-sodium tomato and vegetable juice. Low-sodium or reduced-sodium tomato sauce and tomato paste. Low-sodium or reduced-sodium canned vegetables. Fruits All fresh,  dried, or frozen fruit. Canned fruit in natural juice (without added sugar). Meat and other protein foods Skinless chicken or [place]. Ground chicken or [place]. Pork with fat trimmed off. Fish and seafood. Egg whites. Dried beans, peas, or lentils. Unsalted nuts, nut butters, and seeds. Unsalted canned beans. Lean cuts of beef with fat trimmed off. Low-sodium, lean deli meat. Dairy Low-fat (1%) or fat-free (skim) milk. Fat-free, low-fat, or reduced-fat cheeses. Nonfat, low-sodium ricotta or cottage cheese. Low-fat or nonfat yogurt. Low-fat, low-sodium cheese. Fats and oils Soft margarine without trans fats. Vegetable oil. Low-fat, reduced-fat, or light mayonnaise and salad dressings (reduced-sodium). Canola, safflower, olive, soybean, and sunflower oils. Avocado. Seasoning and other foods Herbs. Spices. Seasoning mixes without salt. Unsalted popcorn and pretzels. Fat-free sweets. What foods are not recommended? The items listed may not be a complete list. Talk with your dietitian about what dietary choices are best for you. Grains Baked goods made with fat, such as croissants, muffins, or some breads. Dry pasta or rice meal packs. Vegetables Creamed or fried vegetables. Vegetables in a cheese sauce. Regular canned vegetables (not low-sodium or reduced-sodium). Regular canned tomato sauce and paste (not low-sodium or reduced-sodium). Regular tomato and vegetable juice (not low-sodium or reduced-sodium). Angie Fava. Olives. Fruits Canned fruit in a light or heavy syrup. Fried fruit. Fruit in cream or butter sauce. Meat and other protein foods Fatty cuts of meat. Ribs. Fried meat. Berniece Salines. Sausage. Bologna and other processed lunch meats. Salami. Fatback. Hotdogs. Bratwurst. Salted nuts and seeds. Canned beans with added salt. Canned or smoked fish. Whole eggs or egg yolks. Chicken or [place] with skin. Dairy Whole or 2% milk, cream, and half-and-half. Whole or full-fat cream cheese. Whole-fat or sweetened  yogurt. Full-fat cheese. Nondairy creamers. Whipped toppings. Processed cheese and cheese spreads. Fats and oils Butter. Stick margarine. Lard. Shortening. Ghee. Bacon fat. Tropical oils, such as coconut, palm kernel, or palm oil. Seasoning and other foods Salted popcorn and pretzels. Onion salt, garlic salt, seasoned salt, table salt, and sea salt. Worcestershire sauce. Tartar sauce. Barbecue sauce. Teriyaki sauce. Soy sauce, including reduced-sodium. Steak sauce. Canned and packaged gravies. Fish sauce. Oyster sauce. Cocktail sauce. Horseradish that you find on the shelf. Ketchup. Mustard. Meat flavorings and tenderizers. Bouillon cubes. Hot sauce and Tabasco sauce. Premade or packaged marinades. Premade or packaged taco seasonings. Relishes. Regular salad dressings. Where to find more information:  National Heart, Lung, and [place]: https://wilson-eaton.com/  American Heart Association: www.heart.org Summary  The DASH eating plan is a healthy eating plan that has been shown to reduce high blood pressure (hypertension). It may also reduce your risk for type 2 diabetes, heart disease, and stroke.  With the DASH eating plan, you should limit salt (sodium) intake to 2,300 mg a day. If you have hypertension, you may need to reduce your sodium intake to 1,500 mg a day.  When on the DASH eating plan, aim to eat more fresh fruits and vegetables, whole grains, lean proteins, low-fat dairy, and heart-healthy fats.  Work with your health care provider  or diet and nutrition specialist (dietitian) to adjust your eating plan to your individual calorie needs. This information is not intended to replace advice given to you by your health care provider. Make sure you discuss any questions you have with your health care provider. Document Released: 02/03/2011 Document Revised: 02/08/2016 Document Reviewed: 02/08/2016 Elsevier Interactive Patient Education  2019 [employer].

## 2018-08-02 ENCOUNTER — Other Ambulatory Visit: Payer: Self-pay | Admitting: Nurse Practitioner

## 2018-08-02 ENCOUNTER — Other Ambulatory Visit: Payer: Medicare HMO

## 2018-08-02 ENCOUNTER — Other Ambulatory Visit: Payer: Self-pay

## 2018-08-02 DIAGNOSIS — Z1231 Encounter for screening mammogram for malignant neoplasm of breast: Secondary | ICD-10-CM

## 2018-08-02 DIAGNOSIS — E785 Hyperlipidemia, unspecified: Secondary | ICD-10-CM

## 2018-08-02 DIAGNOSIS — I10 Essential (primary) hypertension: Secondary | ICD-10-CM

## 2018-08-02 DIAGNOSIS — E039 Hypothyroidism, unspecified: Secondary | ICD-10-CM

## 2018-08-03 ENCOUNTER — Telehealth: Payer: Self-pay

## 2018-08-03 DIAGNOSIS — E039 Hypothyroidism, unspecified: Secondary | ICD-10-CM

## 2018-08-03 LAB — COMPLETE METABOLIC PANEL WITH GFR
AG Ratio: 1.5 (calc) (ref 1.0–2.5)
ALT: 10 U/L (ref 6–29)
AST: 12 U/L (ref 10–35)
Albumin: 4 g/dL (ref 3.6–5.1)
Alkaline phosphatase (APISO): 90 U/L (ref 37–153)
BUN: 13 mg/dL (ref 7–25)
CO2: 24 mmol/L (ref 20–32)
Calcium: 9.2 mg/dL (ref 8.6–10.4)
Chloride: 109 mmol/L (ref 98–110)
Creat: 0.82 mg/dL (ref 0.60–0.93)
GFR, Est African American: 81 mL/min/{1.73_m2} (ref >=60)
GFR, Est Non African American: 70 mL/min/{1.73_m2} (ref >=60)
Globulin: 2.7 g/dL (calc) (ref 1.9–3.7)
Glucose, Bld: 104 mg/dL — ABNORMAL HIGH (ref 65–99)
Potassium: 3.7 mmol/L (ref 3.5–5.3)
Sodium: 142 mmol/L (ref 135–146)
Total Bilirubin: 1 mg/dL (ref 0.2–1.2)
Total Protein: 6.7 g/dL (ref 6.1–8.1)

## 2018-08-03 LAB — CBC WITH DIFFERENTIAL/PLATELET
Absolute Monocytes: 569 cells/uL (ref 200–950)
Basophils Absolute: 58 cells/uL (ref 0–200)
Basophils Relative: 0.8 %
Eosinophils Absolute: 190 cells/uL (ref 15–500)
Eosinophils Relative: 2.6 %
HCT: 36.5 % (ref 35.0–45.0)
Hemoglobin: 12.1 g/dL (ref 11.7–15.5)
Lymphs Abs: 2446 cells/uL (ref 850–3900)
MCH: 31.2 pg (ref 27.0–33.0)
MCHC: 33.2 g/dL (ref 32.0–36.0)
MCV: 94.1 fL (ref 80.0–100.0)
MPV: 11.3 fL (ref 7.5–12.5)
Monocytes Relative: 7.8 %
Neutro Abs: 4037 cells/uL (ref 1500–7800)
Neutrophils Relative %: 55.3 %
Platelets: 283 10*3/uL (ref 140–400)
RBC: 3.88 10*6/uL (ref 3.80–5.10)
RDW: 12.8 % (ref 11.0–15.0)
Total Lymphocyte: 33.5 %
WBC: 7.3 10*3/uL (ref 3.8–10.8)

## 2018-08-03 LAB — LIPID PANEL
Cholesterol: 189 mg/dL (ref <200)
HDL: 64 mg/dL (ref >=50)
LDL Cholesterol (Calc): 105 mg/dL (calc) — ABNORMAL HIGH
Non-HDL Cholesterol (Calc): 125 mg/dL (calc) (ref <130)
Total CHOL/HDL Ratio: 3 (calc) (ref <5.0)
Triglycerides: 100 mg/dL (ref <150)

## 2018-08-03 LAB — TSH: TSH: 0.07 mIU/L — ABNORMAL LOW (ref 0.40–4.50)

## 2018-08-03 MED ORDER — LEVOTHYROXINE SODIUM 100 MCG PO TABS: 100.0000 ug | ORAL_TABLET | Freq: Every day | ORAL | 5 refills | Status: DC

## 2018-08-03 NOTE — Telephone Encounter (Signed)
Discussed results with patient, patient verbalized understanding of results  New RX for decreased dose of levothyroxine faxed to pharmacy on file  Scheduled appointment to recheck TSH in August, future order placed.  Copy of labs mailed to patient

## 2018-08-03 NOTE — Telephone Encounter (Signed)
-----   Message from Lauree Chandler, NP sent at 08/03/2018 11:20 AM EDT ----- TSH continues to be low, lets decrease synthroid to 100 mcg daily and recheck TSH in 8 weeks.  Cholesterol looks better and closer to goal, continue current medication and dietary modifications  (heart healthy diet) Blood counts look good. Blood sugar slightly elevated, to limit sweets, desserts and high starchy foods

## 2018-08-11 LAB — BASIC METABOLIC PANEL
BUN/Creatinine Ratio: 15 (ref 12–28)
BUN: 16 mg/dL (ref 8–27)
CALCIUM: 9.5 mg/dL (ref 8.7–10.3)
CO2: 21 mmol/L (ref 20–29)
CREATININE: 1.08 mg/dL — AB (ref 0.57–1.00)
Chloride: 102 mmol/L (ref 96–106)
GFR calc Af Amer: 58 mL/min/{1.73_m2} — ABNORMAL LOW (ref >59)
GFR calc non Af Amer: 50 mL/min/{1.73_m2} — ABNORMAL LOW (ref >59)
Glucose: 127 mg/dL — ABNORMAL HIGH (ref 65–99)
Potassium: 4.1 mmol/L (ref 3.5–5.2)
SODIUM: 139 mmol/L (ref 134–144)

## 2018-08-13 ENCOUNTER — Telehealth: Payer: Self-pay | Admitting: Adult Health

## 2018-08-13 NOTE — Telephone Encounter (Signed)
New message  Patient is returning your call for lab results. Please call. 

## 2018-08-13 NOTE — Telephone Encounter (Signed)
Pt advised and verbalized understanding.

## 2018-08-14 ENCOUNTER — Telehealth (HOSPITAL_COMMUNITY): Payer: Self-pay | Admitting: Emergency Medicine

## 2018-08-14 NOTE — Telephone Encounter (Signed)
Reaching out to patient to offer assistance regarding upcoming cardiac imaging study; pt verbalizes understanding of appt date/time, parking situation and where to check in, pre-test NPO status and medications ordered, and verified current allergies; name and call back number provided for further questions should they arise Jac Romulus RN Navigator Cardiac Imaging [facility] [phone] office [phone] cell  Pt denies covid symptoms, verbalized understanding of visitor policy. 

## 2018-08-15 ENCOUNTER — Ambulatory Visit (HOSPITAL_COMMUNITY)
Admission: RE | Admit: 2018-08-15 | Discharge: 2018-08-15 | Disposition: A | Discharge status: Home / Self Care | Payer: Medicare HMO | Source: Ambulatory Visit | Attending: Physician Assistant | Admitting: Physician Assistant

## 2018-08-15 ENCOUNTER — Other Ambulatory Visit: Payer: Self-pay

## 2018-08-15 DIAGNOSIS — R072 Precordial pain: Secondary | ICD-10-CM

## 2018-08-15 NOTE — Progress Notes (Signed)
Upon connecting patient to monitor, patient noted to be in afib in a rate of 115-120. Meda Coffee, MD notified of situation. Patient cancelled due to HR and rhythm. Ancillary staff notified.

## 2018-08-20 ENCOUNTER — Telehealth: Payer: Self-pay | Admitting: Cardiovascular Disease

## 2018-08-20 ENCOUNTER — Telehealth: Payer: Self-pay | Admitting: Physician Assistant

## 2018-08-20 NOTE — Telephone Encounter (Signed)
Patient's coronary CT was cancelled as she was in possible afib with HR 120. However there was no documented EKG. She says she continue to have intermittent chest pain and palpitation. Her HR has since normalized. If she does have afib, this would be new for her. I discussed the symptom with the patient over the phone and recommended bringing her in to the clinic for followup visit this week with EKG, if EKG normal, then she will need a heart monitor. We can also consider stress test as well depend on her symptom.   Will forward to office staff to set her up for followup visit this week.

## 2018-08-20 NOTE — Telephone Encounter (Signed)
Error

## 2018-08-21 ENCOUNTER — Telehealth: Payer: Self-pay

## 2018-08-21 NOTE — Telephone Encounter (Signed)
Left a voice message for the patient on her home and mobile number to get her scheduled for an in office visit. Will try calling her again.

## 2018-08-22 NOTE — Telephone Encounter (Signed)
Patient is scheduled   

## 2018-08-25 ENCOUNTER — Other Ambulatory Visit: Payer: Self-pay | Admitting: Nurse Practitioner

## 2018-08-29 ENCOUNTER — Telehealth: Payer: Self-pay | Admitting: Physician Assistant

## 2018-08-29 NOTE — Telephone Encounter (Signed)
LVM, reminding pt of her appt with Almyra Deforest on 08-30-18.

## 2018-08-30 ENCOUNTER — Encounter: Payer: Self-pay | Admitting: Physician Assistant

## 2018-08-30 ENCOUNTER — Other Ambulatory Visit: Payer: Self-pay

## 2018-08-30 ENCOUNTER — Telehealth: Payer: Self-pay | Admitting: Radiology

## 2018-08-30 ENCOUNTER — Ambulatory Visit: Payer: Medicare HMO | Admitting: Physician Assistant

## 2018-08-30 ENCOUNTER — Inpatient Hospital Stay (HOSPITAL_COMMUNITY): Admission: RE | Admit: 2018-08-30 | Payer: Medicare HMO | Source: Ambulatory Visit

## 2018-08-30 VITALS — BP 136/84 | HR 81 | Ht 67.0 in | Wt 198.4 lb

## 2018-08-30 DIAGNOSIS — R072 Precordial pain: Secondary | ICD-10-CM

## 2018-08-30 DIAGNOSIS — I10 Essential (primary) hypertension: Secondary | ICD-10-CM

## 2018-08-30 DIAGNOSIS — E785 Hyperlipidemia, unspecified: Secondary | ICD-10-CM | POA: Diagnosis not present

## 2018-08-30 DIAGNOSIS — R002 Palpitations: Secondary | ICD-10-CM

## 2018-08-30 DIAGNOSIS — E039 Hypothyroidism, unspecified: Secondary | ICD-10-CM

## 2018-08-30 NOTE — Patient Instructions (Signed)
Medication Instructions:  Your physician recommends that you continue on your current medications as directed. Please refer to the Current Medication list given to you today.  If you need a refill on your cardiac medications before your next appointment, please call your pharmacy.   Lab work: NONE ordered at this time of appointment   If you have labs (blood work) drawn today and your tests are completely normal, you will receive your results only by: Marland Kitchen MyChart Message (if you have MyChart) OR . A paper copy in the mail If you have any lab test that is abnormal or we need to change your treatment, we will call you to review the results.  Testing/Procedures: Your physician has requested that you have a lexiscan myoview. For further information please visit HugeFiesta.tn. Please follow instruction sheet, as given.  Your physician has recommended that you wear an event monitor. Event monitors are medical devices that record the heart's electrical activity. Doctors most often [place] these monitors to diagnose arrhythmias. Arrhythmias are problems with the speed or rhythm of the heartbeat. The monitor is a small, portable device. You can wear one while you do your normal daily activities. This is usually used to diagnose what is causing palpitations/syncope (passing out).    Follow-Up: At [facility], you and your health needs are our priority.  As part of our continuing mission to provide you with exceptional heart care, we have created designated Provider Care Teams.  These Care Teams include your primary Cardiologist (physician) and Advanced Practice Providers (APPs -  Physician Assistants and Nurse Practitioners) who all work together to provide you with the care you need, when you need it. You will need a follow up appointment in 6-8 weeks.  Please call our office 2 months in advance to schedule this appointment.  You may see Quay Burow, MD or one of the following Advanced Practice  Providers on your designated Care Team:   Kerin Ransom, PA-C Roby Lofts, [place] . Sande Rives, PA-C  Any Other Special Instructions Will Be Listed Below (If Applicable).
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
## 2018-08-30 NOTE — Telephone Encounter (Signed)
Enrolled patient for a 30 Day Preventice Event Monitor to be mailed. Brief instructions were gone over with the patient and she knows to expect the monitor to arrive in 3-4 days

## 2018-08-31 ENCOUNTER — Encounter: Payer: Self-pay | Admitting: Physician Assistant

## 2018-09-06 ENCOUNTER — Encounter (INDEPENDENT_AMBULATORY_CARE_PROVIDER_SITE_OTHER): Payer: Medicare HMO

## 2018-09-06 DIAGNOSIS — R002 Palpitations: Secondary | ICD-10-CM | POA: Diagnosis not present

## 2018-09-12 ENCOUNTER — Telehealth (HOSPITAL_COMMUNITY): Payer: Self-pay

## 2018-09-12 NOTE — Telephone Encounter (Signed)
Encounter complete. 

## 2018-09-13 ENCOUNTER — Other Ambulatory Visit: Payer: Self-pay

## 2018-09-13 ENCOUNTER — Ambulatory Visit (HOSPITAL_COMMUNITY)
Admission: RE | Admit: 2018-09-13 | Discharge: 2018-09-13 | Disposition: A | Discharge status: Home / Self Care | Payer: Medicare HMO | Source: Ambulatory Visit | Attending: Cardiology | Admitting: Cardiology

## 2018-09-13 DIAGNOSIS — R072 Precordial pain: Secondary | ICD-10-CM | POA: Insufficient documentation

## 2018-09-13 LAB — MYOCARDIAL PERFUSION IMAGING
LV dias vol: 83 mL (ref 46–106)
LV sys vol: 23 mL
Peak HR: 99 {beats}/min
Rest HR: 67 {beats}/min
SDS: 7
SRS: 1
SSS: 8
TID: 1.07

## 2018-09-13 MED ORDER — TECHNETIUM TC 99M TETROFOSMIN IV KIT
29.9000 | PACK | Freq: Once | INTRAVENOUS | Status: AC | PRN
  Administered 2018-09-13: 29.9 via INTRAVENOUS
  Filled 2018-09-13: qty 30

## 2018-09-13 MED ORDER — REGADENOSON 0.4 MG/5ML IV SOLN
0.4000 mg | Freq: Once | INTRAVENOUS | Status: AC
  Administered 2018-09-13: 0.4 mg via INTRAVENOUS

## 2018-09-13 MED ORDER — TECHNETIUM TC 99M TETROFOSMIN IV KIT
10.1000 | PACK | Freq: Once | INTRAVENOUS | Status: AC | PRN
  Administered 2018-09-13: 10.1 via INTRAVENOUS
  Filled 2018-09-13: qty 11

## 2018-09-17 ENCOUNTER — Telehealth: Payer: Self-pay

## 2018-09-17 NOTE — Telephone Encounter (Signed)
Spoke with pt who states she was asleep when heart monitor was triggered and did not have any symptoms. Pt denies symptoms currently and states she has not had any palpitations since she was instructed during last appt 7/2 to take her metoprolol 12.5 mg BID. Advised her to continue on monitor, following instructions for alerting Preventive of symptoms and may contact our office to discuss symptoms.  Informed her that would consult DOD and if any recommendations would call her back. Pt verbalized understanding   7/18 monitor results reviewed by DOD [place]. Advised nurse to have Dr. Gwenlyn Found review

## 2018-09-17 NOTE — Progress Notes (Signed)
Patient is scheduled to see Jory Sims, NP on Wed 09-19-18 at 10:45AM

## 2018-09-17 NOTE — Progress Notes (Signed)
Patient need a earlier visit this week to discuss stress test result

## 2018-09-18 ENCOUNTER — Telehealth: Payer: Self-pay

## 2018-09-18 ENCOUNTER — Ambulatory Visit (HOSPITAL_COMMUNITY): Payer: Medicare HMO

## 2018-09-18 ENCOUNTER — Telehealth: Payer: Self-pay | Admitting: Adult Health
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
## 2018-09-18 NOTE — Telephone Encounter (Signed)
Thank you :)

## 2018-09-18 NOTE — Progress Notes (Signed)
Thank you I will discuss with Dr. Gwenlyn Found about getting him scheduled for cath.

## 2018-09-18 NOTE — Telephone Encounter (Signed)

## 2018-09-18 NOTE — Telephone Encounter (Signed)
Spoke with pt who is aware of 7/22 appt with Jory Sims, DNP at 10:45am. Per Dr. Gwenlyn Found pt to keep this appt and both he and Jory Sims can discuss the next steps in pt care. Pt verbalized understanding

## 2018-09-18 NOTE — Progress Notes (Signed)
Your schedule was full, therefore I scheduled the patient with Curt Bears tomorrow when you are the DOD. Her heart monitor also showed atrial flutter as well, therefore need to coordinate the timing of cath with anticoagulation after. [place] has the strip

## 2018-09-19 ENCOUNTER — Ambulatory Visit: Payer: Medicare HMO | Admitting: Adult Health

## 2018-09-19 ENCOUNTER — Other Ambulatory Visit: Payer: Self-pay

## 2018-09-19 ENCOUNTER — Telehealth: Payer: Self-pay | Admitting: Cardiovascular Disease

## 2018-09-19 ENCOUNTER — Telehealth: Payer: Self-pay

## 2018-09-19 ENCOUNTER — Ambulatory Visit (HOSPITAL_COMMUNITY): Payer: Medicare HMO

## 2018-09-19 ENCOUNTER — Encounter: Payer: Self-pay | Admitting: Adult Health

## 2018-09-19 VITALS — BP 130/80 | HR 67 | Temp 97.0°F | Ht 67.0 in | Wt 196.6 lb

## 2018-09-19 DIAGNOSIS — R9439 Abnormal result of other cardiovascular function study: Secondary | ICD-10-CM

## 2018-09-19 DIAGNOSIS — I480 Paroxysmal atrial fibrillation: Secondary | ICD-10-CM

## 2018-09-19 DIAGNOSIS — I10 Essential (primary) hypertension: Secondary | ICD-10-CM

## 2018-09-19 DIAGNOSIS — Z01812 Encounter for preprocedural laboratory examination: Secondary | ICD-10-CM

## 2018-09-19 DIAGNOSIS — E039 Hypothyroidism, unspecified: Secondary | ICD-10-CM

## 2018-09-19 LAB — CBC
Hematocrit: 40 % (ref 34.0–46.6)
Hemoglobin: 13.2 g/dL (ref 11.1–15.9)
MCH: 30.9 pg (ref 26.6–33.0)
MCHC: 33 g/dL (ref 31.5–35.7)
MCV: 94 fL (ref 79–97)
Platelets: 297 10*3/uL (ref 150–450)
RBC: 4.27 x10E6/uL (ref 3.77–5.28)
RDW: 12.1 % (ref 11.7–15.4)
WBC: 8.7 10*3/uL (ref 3.4–10.8)

## 2018-09-19 LAB — BASIC METABOLIC PANEL
BUN/Creatinine Ratio: 13 (ref 12–28)
BUN: 14 mg/dL (ref 8–27)
CO2: 23 mmol/L (ref 20–29)
Calcium: 9.8 mg/dL (ref 8.7–10.3)
Chloride: 105 mmol/L (ref 96–106)
Creatinine, Ser: 1.08 mg/dL — ABNORMAL HIGH (ref 0.57–1.00)
GFR calc Af Amer: 58 mL/min/{1.73_m2} — ABNORMAL LOW (ref >59)
GFR calc non Af Amer: 50 mL/min/{1.73_m2} — ABNORMAL LOW (ref >59)
Glucose: 95 mg/dL (ref 65–99)
Potassium: 4.1 mmol/L (ref 3.5–5.2)
Sodium: 144 mmol/L (ref 134–144)

## 2018-09-19 NOTE — Telephone Encounter (Signed)
Spoke with pt and advised that per Dr. Kennon Holter nurse, she is to arrive at 11:30 am since procedure is at 1:30 pm. Pt voiced understanding.

## 2018-09-19 NOTE — Telephone Encounter (Signed)
New Message    Patient states she needs to to know the correct time to be here for procedure on 09/24/18.

## 2018-09-19 NOTE — Telephone Encounter (Signed)
CONTACTED PT AND MADE PT AWARE TO ARRIVE AT [facility] AT 11:30AM FOR PROCEDURE START TIME OF 1:30PM ON 09/24/2018. PT VERBALIZED UNDERSTANDING

## 2018-09-19 NOTE — Patient Instructions (Addendum)
Medication Instructions:  Continue current medications  If you need a refill on your cardiac medications before your next appointment, please call your pharmacy.  Labwork: Pre Op Labs HERE IN OUR OFFICE AT [facility] You will NOT need to fast   Take the provided lab slips with you to the lab for your blood draw.   When you have your labs (blood work) drawn today and your tests are completely normal, you will receive your results only by MyChart Message (if you have MyChart) -OR-  A paper copy in the mail.  If you have any lab test that is abnormal or we need to change your treatment, we will call you to review these results.  Testing/Procedures: Your physician has requested that you have a cardiac catheterization. Cardiac catheterization is used to diagnose and/or treat various heart conditions. Doctors may recommend this procedure for a number of different reasons. The most common reason is to evaluate chest pain. Chest pain can be a symptom of coronary artery disease (CAD), and cardiac catheterization can show whether plaque is narrowing or blocking your heart's arteries. This procedure is also used to evaluate the valves, as well as measure the blood flow and oxygen levels in different parts of your heart. For further information please visit [email or web address]. Please follow instruction sheet, as given.   Special Instructions:    [place] [address] [place] [address] Dept: [phone] Loc: [phone]  SHERIDAN HEW  09/19/2018  You are scheduled for a Cardiac Catheterization on Monday, July 27 with Dr. Quay Burow.  1. Please arrive at the [facility] (Main Entrance A) at [facility]: [address] [place], [place] [address] at 10:30 AM (This time is two hours before your procedure to ensure your preparation). Free valet parking service is available.   Special note: Every  effort is made to have your procedure done on time. Please understand that emergencies sometimes delay scheduled procedures.  2. Diet: Do not eat solid foods after midnight.  The patient may have clear liquids until 5am upon the day of the procedure.  3. Labs: You will need to have blood drawn on Wednesday, July 22 at [place]  Open: [place] (Lunch 12:30 - 1:30)   Phone: [phone]. You do not need to be fasting.  Patient Instructions This is a Drive Up Visit at the [facility] Entrance [address].  Someone will direct you where to park. Stay in your car and someone will be with you shortly.   4. Medication instructions in preparation for your procedure:   Contrast Allergy: No  On the morning of your procedure, take your Aspirin and any morning medicines NOT listed above.  You may use sips of water.  5. Plan for one night stay--bring personal belongings. 6. Bring a current list of your medications and current insurance cards. 7. You MUST have a responsible person to drive you home. 8. Someone MUST be with you the first 24 hours after you arrive home or your discharge will be delayed. 9. Please wear clothes that are easy to get on and off and wear slip-on shoes.  Thank you for allowing [place] to care for you!   -- [facility]   Follow-Up: . Your physician recommends that you schedule a follow-up appointment in: After Heart Cath with Dr Gwenlyn Found   At [facility],  you and your health needs are our priority.  As part of our continuing mission to provide you with exceptional heart care, we have created designated Provider Care Teams.  These Care Teams include your primary Cardiologist (physician) and Advanced Practice Providers (APPs -  Physician Assistants and Nurse Practitioners) who all work together to provide you with the care you need, when you need it.  Thank you for choosing  [facility] at [facility]!!

## 2018-09-20 ENCOUNTER — Other Ambulatory Visit: Payer: Self-pay

## 2018-09-20 ENCOUNTER — Other Ambulatory Visit: Payer: Self-pay | Admitting: Nurse Practitioner

## 2018-09-20 ENCOUNTER — Telehealth: Payer: Self-pay | Admitting: *Deleted

## 2018-09-20 NOTE — Telephone Encounter (Signed)
Pt contacted pre-catheterization scheduled at [facility] for: Monday September 24, 2018 1:30 PM Verified arrival time and place: [place] Entrance A at: 11:30 AM  Covid-19 test date: 09/21/18  No solid food after midnight prior to cath, clear liquids until 5 AM day of procedure. Contrast allergy: pt states she has a shellfish allergy but is not allergic to contrast.  Hold: Losartan -day before and day of procedure-GFR 58  Except hold medications AM meds can be  taken pre-cath with sip of water including: ASA 81 mg   Confirmed patient has responsible person to drive home post procedure and observe 24 hours after arriving home: yes  Due to Covid-19 pandemic, only one support person will be allowed with patient. Must be the same support person for that patient's entire stay. On arrival the support person will be required to wear a mask and will be screened, including having temporal temperature checked (below 100.4 to be cleared). They will be required to wait in the [facility] waiting room for the duration of the procedure. To limit exposure, MDs will continue to call support person after the procedure instead of speaking with them in consult room.   Patients are required to wear a mask when they enter the hospital.       COVID-19 Pre-Screening Questions:  . In the past 7 to 10 days have you had a cough,  shortness of breath, headache, congestion, fever (100 or greater) body aches, chills, sore throat, or sudden loss of taste or sense of smell? no . Have you been around anyone with known Covid 19? no . Have you been around anyone who is awaiting Covid 19 test results in the past 7 to 10 days? no . Have you been around anyone who has been exposed to Covid 19, or has mentioned symptoms of Covid 19 within the past 7 to 10 days? no   I reviewed procedure/mask/visitor instructions, Covid-19 screening questions with patient, she verbalized understanding, thanked me for call.

## 2018-09-21 ENCOUNTER — Other Ambulatory Visit (HOSPITAL_COMMUNITY)
Admission: RE | Admit: 2018-09-21 | Discharge: 2018-09-21 | Disposition: A | Discharge status: Home / Self Care | Payer: Medicare HMO | Source: Ambulatory Visit | Attending: Cardiovascular Disease | Admitting: Cardiovascular Disease

## 2018-09-21 DIAGNOSIS — Z1159 Encounter for screening for other viral diseases: Secondary | ICD-10-CM | POA: Insufficient documentation

## 2018-09-22 LAB — SARS CORONAVIRUS 2 (TAT 6-24 HRS): SARS Coronavirus 2: NEGATIVE

## 2018-09-24 ENCOUNTER — Ambulatory Visit (HOSPITAL_COMMUNITY)
Admission: RE | Admit: 2018-09-24 | Discharge: 2018-09-25 | Disposition: A | Discharge status: Home / Self Care | Payer: Medicare HMO | Attending: Cardiovascular Disease | Admitting: Cardiovascular Disease

## 2018-09-24 ENCOUNTER — Other Ambulatory Visit: Payer: Self-pay

## 2018-09-24 ENCOUNTER — Ambulatory Visit (HOSPITAL_COMMUNITY)
Admission: RE | Disposition: A | Discharge status: Home / Self Care | Payer: Self-pay | Source: Home / Self Care | Attending: Cardiovascular Disease

## 2018-09-24 DIAGNOSIS — Z87891 Personal history of nicotine dependence: Secondary | ICD-10-CM | POA: Diagnosis not present

## 2018-09-24 DIAGNOSIS — I2510 Atherosclerotic heart disease of native coronary artery without angina pectoris: Secondary | ICD-10-CM | POA: Insufficient documentation

## 2018-09-24 DIAGNOSIS — I10 Essential (primary) hypertension: Secondary | ICD-10-CM | POA: Insufficient documentation

## 2018-09-24 DIAGNOSIS — R0789 Other chest pain: Secondary | ICD-10-CM

## 2018-09-24 DIAGNOSIS — Z9851 Tubal ligation status: Secondary | ICD-10-CM | POA: Insufficient documentation

## 2018-09-24 DIAGNOSIS — R9439 Abnormal result of other cardiovascular function study: Secondary | ICD-10-CM | POA: Diagnosis not present

## 2018-09-24 DIAGNOSIS — Z7982 Long term (current) use of aspirin: Secondary | ICD-10-CM | POA: Insufficient documentation

## 2018-09-24 DIAGNOSIS — I480 Paroxysmal atrial fibrillation: Secondary | ICD-10-CM | POA: Insufficient documentation

## 2018-09-24 DIAGNOSIS — E079 Disorder of thyroid, unspecified: Secondary | ICD-10-CM | POA: Diagnosis present

## 2018-09-24 DIAGNOSIS — Z7901 Long term (current) use of anticoagulants: Secondary | ICD-10-CM

## 2018-09-24 DIAGNOSIS — Z79899 Other long term (current) drug therapy: Secondary | ICD-10-CM | POA: Insufficient documentation

## 2018-09-24 DIAGNOSIS — E785 Hyperlipidemia, unspecified: Secondary | ICD-10-CM | POA: Insufficient documentation

## 2018-09-24 DIAGNOSIS — Z8249 Family history of ischemic heart disease and other diseases of the circulatory system: Secondary | ICD-10-CM | POA: Diagnosis not present

## 2018-09-24 DIAGNOSIS — E039 Hypothyroidism, unspecified: Secondary | ICD-10-CM | POA: Insufficient documentation

## 2018-09-24 DIAGNOSIS — Z79890 Hormone replacement therapy (postmenopausal): Secondary | ICD-10-CM | POA: Insufficient documentation

## 2018-09-24 DIAGNOSIS — Z955 Presence of coronary angioplasty implant and graft: Secondary | ICD-10-CM

## 2018-09-24 DIAGNOSIS — Z9861 Coronary angioplasty status: Secondary | ICD-10-CM | POA: Diagnosis present

## 2018-09-24 HISTORY — PX: LEFT HEART CATH AND CORONARY ANGIOGRAPHY: CATH118249

## 2018-09-24 HISTORY — PX: CORONARY STENT INTERVENTION: CATH118234

## 2018-09-24 LAB — POCT ACTIVATED CLOTTING TIME
Activated Clotting Time: 241 seconds
Activated Clotting Time: 296 seconds

## 2018-09-24 SURGERY — LEFT HEART CATH AND CORONARY ANGIOGRAPHY
Anesthesia: LOCAL

## 2018-09-24 MED ORDER — SODIUM CHLORIDE 0.9% FLUSH
3.0000 mL | Freq: Two times a day (BID) | INTRAVENOUS | Status: DC
  Administered 2018-09-24 – 2018-09-25 (×2): 3 mL via INTRAVENOUS

## 2018-09-24 MED ORDER — ACETAMINOPHEN 325 MG PO TABS: 650.0000 mg | ORAL_TABLET | ORAL | Status: DC | PRN

## 2018-09-24 MED ORDER — SODIUM CHLORIDE 0.9 % IV SOLN
INTRAVENOUS | Status: AC
  Administered 2018-09-24: 20:00:00 via INTRAVENOUS

## 2018-09-24 MED ORDER — HYDRALAZINE HCL 20 MG/ML IJ SOLN: 10.0000 mg | INTRAMUSCULAR | Status: AC | PRN

## 2018-09-24 MED ORDER — SODIUM CHLORIDE 0.9% FLUSH: 3.0000 mL | INTRAVENOUS | Status: DC | PRN

## 2018-09-24 MED ORDER — SODIUM CHLORIDE 0.9 % WEIGHT BASED INFUSION: 1.0000 mL/kg/h | INTRAVENOUS | Status: DC

## 2018-09-24 MED ORDER — ONDANSETRON HCL 4 MG/2ML IJ SOLN: 4.0000 mg | Freq: Four times a day (QID) | INTRAMUSCULAR | Status: DC | PRN

## 2018-09-24 MED ORDER — AMLODIPINE BESYLATE 10 MG PO TABS
10.0000 mg | ORAL_TABLET | Freq: Every day | ORAL | Status: DC
  Administered 2018-09-25: 10 mg via ORAL
  Filled 2018-09-24: qty 1

## 2018-09-24 MED ORDER — HEPARIN (PORCINE) IN NACL 1000-0.9 UT/500ML-% IV SOLN
INTRAVENOUS | Status: AC
  Filled 2018-09-24: qty 1000

## 2018-09-24 MED ORDER — SODIUM CHLORIDE 0.9% FLUSH: 3.0000 mL | Freq: Two times a day (BID) | INTRAVENOUS | Status: DC

## 2018-09-24 MED ORDER — MORPHINE SULFATE (PF) 2 MG/ML IV SOLN: 2.0000 mg | INTRAVENOUS | Status: DC | PRN

## 2018-09-24 MED ORDER — METOPROLOL TARTRATE 12.5 MG HALF TABLET
12.5000 mg | ORAL_TABLET | Freq: Two times a day (BID) | ORAL | Status: DC
  Administered 2018-09-24 – 2018-09-25 (×2): 12.5 mg via ORAL
  Filled 2018-09-24 (×2): qty 1

## 2018-09-24 MED ORDER — ASPIRIN 81 MG PO CHEW: 81.0000 mg | CHEWABLE_TABLET | ORAL | Status: DC

## 2018-09-24 MED ORDER — ASPIRIN EC 81 MG PO TBEC
81.0000 mg | DELAYED_RELEASE_TABLET | Freq: Every day | ORAL | Status: DC
  Administered 2018-09-25: 81 mg via ORAL
  Filled 2018-09-24: qty 1

## 2018-09-24 MED ORDER — CLOPIDOGREL BISULFATE 300 MG PO TABS
ORAL_TABLET | ORAL | Status: AC
  Filled 2018-09-24: qty 2

## 2018-09-24 MED ORDER — METHYLPREDNISOLONE SODIUM SUCC 125 MG IJ SOLR
INTRAMUSCULAR | Status: DC | PRN
  Administered 2018-09-24: 60 mg via INTRAVENOUS

## 2018-09-24 MED ORDER — VERAPAMIL HCL 2.5 MG/ML IV SOLN
INTRAVENOUS | Status: DC | PRN
  Administered 2018-09-24 (×2): 5 mL via INTRA_ARTERIAL

## 2018-09-24 MED ORDER — LIDOCAINE HCL (PF) 1 % IJ SOLN
INTRAMUSCULAR | Status: AC
  Filled 2018-09-24: qty 30

## 2018-09-24 MED ORDER — HEPARIN SODIUM (PORCINE) 1000 UNIT/ML IJ SOLN
INTRAMUSCULAR | Status: AC
  Filled 2018-09-24: qty 1

## 2018-09-24 MED ORDER — HEPARIN (PORCINE) IN NACL 1000-0.9 UT/500ML-% IV SOLN
INTRAVENOUS | Status: DC | PRN
  Administered 2018-09-24 (×2): 500 mL

## 2018-09-24 MED ORDER — ASPIRIN 81 MG PO CHEW: 81.0000 mg | CHEWABLE_TABLET | Freq: Every day | ORAL | Status: DC

## 2018-09-24 MED ORDER — IOHEXOL 350 MG/ML SOLN
INTRAVENOUS | Status: DC | PRN
  Administered 2018-09-24: 240 mL via INTRAVENOUS

## 2018-09-24 MED ORDER — METHYLPREDNISOLONE SODIUM SUCC 125 MG IJ SOLR
INTRAMUSCULAR | Status: AC
  Filled 2018-09-24: qty 2

## 2018-09-24 MED ORDER — HEPARIN SODIUM (PORCINE) 1000 UNIT/ML IJ SOLN
INTRAMUSCULAR | Status: DC | PRN
  Administered 2018-09-24: 3000 [IU] via INTRAVENOUS
  Administered 2018-09-24 (×2): 4500 [IU] via INTRAVENOUS

## 2018-09-24 MED ORDER — CLOPIDOGREL BISULFATE 300 MG PO TABS
ORAL_TABLET | ORAL | Status: DC | PRN
  Administered 2018-09-24: 600 mg via ORAL

## 2018-09-24 MED ORDER — DIPHENHYDRAMINE HCL 50 MG/ML IJ SOLN
INTRAMUSCULAR | Status: AC
  Filled 2018-09-24: qty 1

## 2018-09-24 MED ORDER — LABETALOL HCL 5 MG/ML IV SOLN: 10.0000 mg | INTRAVENOUS | Status: AC | PRN

## 2018-09-24 MED ORDER — VERAPAMIL HCL 2.5 MG/ML IV SOLN
INTRAVENOUS | Status: AC
  Filled 2018-09-24: qty 2

## 2018-09-24 MED ORDER — CLOPIDOGREL BISULFATE 75 MG PO TABS
75.0000 mg | ORAL_TABLET | Freq: Every day | ORAL | Status: DC
  Administered 2018-09-25: 75 mg via ORAL
  Filled 2018-09-24: qty 1

## 2018-09-24 MED ORDER — FENTANYL CITRATE (PF) 100 MCG/2ML IJ SOLN
INTRAMUSCULAR | Status: DC | PRN
  Administered 2018-09-24: 25 ug via INTRAVENOUS

## 2018-09-24 MED ORDER — LOSARTAN POTASSIUM 50 MG PO TABS
100.0000 mg | ORAL_TABLET | Freq: Every day | ORAL | Status: DC
  Administered 2018-09-25: 100 mg via ORAL
  Filled 2018-09-24: qty 2

## 2018-09-24 MED ORDER — MIDAZOLAM HCL 2 MG/2ML IJ SOLN
INTRAMUSCULAR | Status: DC | PRN
  Administered 2018-09-24: 1 mg via INTRAVENOUS

## 2018-09-24 MED ORDER — MIDAZOLAM HCL 2 MG/2ML IJ SOLN
INTRAMUSCULAR | Status: AC
  Filled 2018-09-24: qty 2

## 2018-09-24 MED ORDER — SODIUM CHLORIDE 0.9 % WEIGHT BASED INFUSION
3.0000 mL/kg/h | INTRAVENOUS | Status: DC
  Administered 2018-09-24: 3 mL/kg/h via INTRAVENOUS

## 2018-09-24 MED ORDER — LIDOCAINE HCL (PF) 1 % IJ SOLN
INTRAMUSCULAR | Status: DC | PRN
  Administered 2018-09-24: 2 mL

## 2018-09-24 MED ORDER — SODIUM CHLORIDE 0.9 % IV SOLN: 250.0000 mL | INTRAVENOUS | Status: DC | PRN

## 2018-09-24 MED ORDER — NITROGLYCERIN 1 MG/10 ML FOR IR/CATH LAB
INTRA_ARTERIAL | Status: AC
  Filled 2018-09-24: qty 10

## 2018-09-24 MED ORDER — SIMVASTATIN 20 MG PO TABS: 20.0000 mg | ORAL_TABLET | ORAL | Status: DC

## 2018-09-24 MED ORDER — FENTANYL CITRATE (PF) 100 MCG/2ML IJ SOLN
INTRAMUSCULAR | Status: AC
  Filled 2018-09-24: qty 2

## 2018-09-24 MED ORDER — DIPHENHYDRAMINE HCL 50 MG/ML IJ SOLN
INTRAMUSCULAR | Status: DC | PRN
  Administered 2018-09-24: 25 mg via INTRAVENOUS

## 2018-09-24 MED ORDER — LEVOTHYROXINE SODIUM 100 MCG PO TABS
100.0000 ug | ORAL_TABLET | Freq: Every day | ORAL | Status: DC
  Administered 2018-09-25: 100 ug via ORAL
  Filled 2018-09-24: qty 1

## 2018-09-24 SURGICAL SUPPLY — 25 items
BALLN SAPPHIRE 2.0X12 (BALLOONS) ×4
BALLN SAPPHIRE ~~LOC~~ 3.75X10 (BALLOONS) ×1 IMPLANT
BALLN SAPPHIRE ~~LOC~~ 3.75X12 (BALLOONS) ×1 IMPLANT
BALLN ~~LOC~~ EMERGE MR 2.75X12 (BALLOONS) ×2
BALLOON SAPPHIRE 2.0X12 (BALLOONS) IMPLANT
BALLOON ~~LOC~~ EMERGE MR 2.75X12 (BALLOONS) IMPLANT
BAND CMPR LRG ZPHR (HEMOSTASIS) ×1
BAND ZEPHYR COMPRESS 30 LONG (HEMOSTASIS) ×1 IMPLANT
CATH INFINITI 5FR ANG PIGTAIL (CATHETERS) ×1 IMPLANT
CATH OPTITORQUE TIG 4.0 5F (CATHETERS) ×1 IMPLANT
CATH VISTA GUIDE 6FR XB3.5 (CATHETERS) ×1 IMPLANT
CATH VISTA GUIDE 6FR XBLAD4 (CATHETERS) ×1 IMPLANT
GLIDESHEATH SLEND A-KIT 6F 22G (SHEATH) ×1 IMPLANT
GUIDEWIRE INQWIRE 1.5J.035X260 (WIRE) IMPLANT
INQWIRE 1.5J .035X260CM (WIRE) ×2
KIT ENCORE 26 ADVANTAGE (KITS) ×1 IMPLANT
KIT ESSENTIALS PG (KITS) ×1 IMPLANT
KIT HEART LEFT (KITS) ×2 IMPLANT
PACK CARDIAC CATHETERIZATION (CUSTOM PROCEDURE TRAY) ×2 IMPLANT
STENT SYNERGY DES 2.5X16 (Permanent Stent) ×1 IMPLANT
STENT SYNERGY DES 3.5X16 (Permanent Stent) ×1 IMPLANT
TRANSDUCER W/STOPCOCK (MISCELLANEOUS) ×2 IMPLANT
TUBING CIL FLEX 10 FLL-RA (TUBING) ×2 IMPLANT
WIRE ASAHI PROWATER 180CM (WIRE) ×3 IMPLANT
WIRE HI TORQ VERSACORE-J 145CM (WIRE) ×1 IMPLANT

## 2018-09-24 NOTE — Progress Notes (Addendum)
Attempt to remove TR band at 2215. Noted oozing once  air  removed.  3 cc air replaced.  Re attempted at 2245 with ooze again.  3 cc replaced.  Pt educated each time to limit RUE movement.

## 2018-09-24 NOTE — Interval H&P Note (Signed)
Cath Lab Visit (complete for each Cath Lab visit)  Clinical Evaluation Leading to the Procedure:   ACS: No.  Non-ACS:    Anginal Classification: CCS II  Anti-ischemic medical therapy: Maximal Therapy (2 or more classes of medications)  Non-Invasive Test Results: Intermediate-risk stress test findings: cardiac mortality 1-3%/year  Prior CABG: No previous CABG      History and Physical Interval Note:  09/24/2018 4:06 PM  Tamara Larson  has presented today for surgery, with the diagnosis of Abnormal stress test.  The various methods of treatment have been discussed with the patient and family. After consideration of risks, benefits and other options for treatment, the patient has consented to  Procedure(s): LEFT HEART CATH AND CORONARY ANGIOGRAPHY (N/A) as a surgical intervention.  The patient's history has been reviewed, patient examined, no change in status, stable for surgery.  I have reviewed the patient's chart and labs.  Questions were answered to the patient's satisfaction.     Quay Burow

## 2018-09-25 ENCOUNTER — Encounter (HOSPITAL_COMMUNITY): Payer: Self-pay | Admitting: Cardiovascular Disease

## 2018-09-25 DIAGNOSIS — I200 Unstable angina: Secondary | ICD-10-CM | POA: Diagnosis not present

## 2018-09-25 DIAGNOSIS — I480 Paroxysmal atrial fibrillation: Secondary | ICD-10-CM

## 2018-09-25 DIAGNOSIS — E039 Hypothyroidism, unspecified: Secondary | ICD-10-CM | POA: Diagnosis not present

## 2018-09-25 DIAGNOSIS — Z7901 Long term (current) use of anticoagulants: Secondary | ICD-10-CM

## 2018-09-25 DIAGNOSIS — I10 Essential (primary) hypertension: Secondary | ICD-10-CM | POA: Diagnosis not present

## 2018-09-25 DIAGNOSIS — I2510 Atherosclerotic heart disease of native coronary artery without angina pectoris: Secondary | ICD-10-CM | POA: Diagnosis not present

## 2018-09-25 DIAGNOSIS — R9439 Abnormal result of other cardiovascular function study: Secondary | ICD-10-CM | POA: Diagnosis not present

## 2018-09-25 HISTORY — DX: Paroxysmal atrial fibrillation: I48.0

## 2018-09-25 HISTORY — DX: Long term (current) use of anticoagulants: Z79.01

## 2018-09-25 LAB — CBC
HCT: 36.1 % (ref 36.0–46.0)
Hemoglobin: 12.1 g/dL (ref 12.0–15.0)
MCH: 31.2 pg (ref 26.0–34.0)
MCHC: 33.5 g/dL (ref 30.0–36.0)
MCV: 93 fL (ref 80.0–100.0)
Platelets: 263 10*3/uL (ref 150–400)
RBC: 3.88 MIL/uL (ref 3.87–5.11)
RDW: 13.5 % (ref 11.5–15.5)
WBC: 11.7 10*3/uL — ABNORMAL HIGH (ref 4.0–10.5)
nRBC: 0 % (ref 0.0–0.2)

## 2018-09-25 LAB — BASIC METABOLIC PANEL
Anion gap: 9 (ref 5–15)
BUN: 16 mg/dL (ref 8–23)
CO2: 21 mmol/L — ABNORMAL LOW (ref 22–32)
Calcium: 9 mg/dL (ref 8.9–10.3)
Chloride: 108 mmol/L (ref 98–111)
Creatinine, Ser: 0.86 mg/dL (ref 0.44–1.00)
GFR calc Af Amer: >60 mL/min (ref >60)
GFR calc non Af Amer: >60 mL/min (ref >60)
Glucose, Bld: 137 mg/dL — ABNORMAL HIGH (ref 70–99)
Potassium: 3.5 mmol/L (ref 3.5–5.1)
Sodium: 138 mmol/L (ref 135–145)

## 2018-09-25 MED ORDER — FAMOTIDINE 20 MG PO TABS: 20.0000 mg | ORAL_TABLET | Freq: Every day | ORAL | Status: DC | PRN

## 2018-09-25 MED ORDER — CLOPIDOGREL BISULFATE 75 MG PO TABS: 75.0000 mg | ORAL_TABLET | Freq: Every day | ORAL | 11 refills | Status: DC

## 2018-09-25 MED ORDER — NITROGLYCERIN 0.4 MG SL SUBL: 0.4000 mg | SUBLINGUAL_TABLET | SUBLINGUAL | 4 refills | Status: DC | PRN

## 2018-09-25 MED ORDER — NITROGLYCERIN 0.4 MG SL SUBL: 0.4000 mg | SUBLINGUAL_TABLET | SUBLINGUAL | Status: DC | PRN

## 2018-09-25 MED ORDER — APIXABAN 5 MG PO TABS: 5.0000 mg | ORAL_TABLET | Freq: Two times a day (BID) | ORAL | 6 refills | Status: DC

## 2018-09-25 MED ORDER — FAMOTIDINE 20 MG PO TABS: 20.0000 mg | ORAL_TABLET | Freq: Every day | ORAL | 1 refills | Status: DC | PRN

## 2018-09-25 MED ORDER — SIMVASTATIN 20 MG PO TABS: ORAL_TABLET | ORAL | 1 refills | Status: DC

## 2018-09-25 MED ORDER — POTASSIUM CHLORIDE CRYS ER 20 MEQ PO TBCR
20.0000 meq | EXTENDED_RELEASE_TABLET | Freq: Once | ORAL | Status: AC
  Administered 2018-09-25: 20 meq via ORAL
  Filled 2018-09-25: qty 1

## 2018-09-25 MED ORDER — APIXABAN 5 MG PO TABS
5.0000 mg | ORAL_TABLET | Freq: Two times a day (BID) | ORAL | Status: DC
  Administered 2018-09-25: 5 mg via ORAL
  Filled 2018-09-25: qty 1

## 2018-09-25 MED FILL — SM ACID REDUCER 20 MG TAB: 20 | 25 days supply | Qty: 25 | Fill #0

## 2018-09-25 MED FILL — NITROGLYCERIN 0.4 MG TAB SL: 0.4 | 8 days supply | Qty: 25 | Fill #0

## 2018-09-25 MED FILL — SIMVASTATIN 20 MG TABLET: 20 | 30 days supply | Qty: 15 | Fill #0

## 2018-09-25 MED FILL — CLOPIDOGREL 75 MG TABLET: 75 | 30 days supply | Qty: 30 | Fill #0

## 2018-09-25 MED FILL — ELIQUIS 5 MG TABLET: 5 | 30 days supply | Qty: 60 | Fill #0

## 2018-09-25 MED FILL — Nitroglycerin IV Soln 100 MCG/ML in D5W: INTRA_ARTERIAL | Qty: 10 | Status: AC

## 2018-09-25 NOTE — Progress Notes (Addendum)
Progress Note  Patient Name: Tamara Larson Date of Encounter: 09/25/2018  Primary Cardiologist: Quay Burow, MD   Subjective   No pain feeling well.   Inpatient Medications    Scheduled Meds: . amLODipine  10 mg Oral Daily  . aspirin EC  81 mg Oral Daily  . clopidogrel  75 mg Oral Q breakfast  . levothyroxine  100 mcg Oral QAC breakfast  . losartan  100 mg Oral Daily  . metoprolol tartrate  12.5 mg Oral BID  . simvastatin  20 mg Oral Once per day on Tue Thu Sat  . sodium chloride flush  3 mL Intravenous Q12H   Continuous Infusions: . sodium chloride     PRN Meds: sodium chloride, acetaminophen, morphine injection, ondansetron (ZOFRAN) IV, sodium chloride flush   Vital Signs    Vitals:   09/24/18 2005 09/24/18 2153 09/25/18 0014 09/25/18 0442  BP:  114/75 136/83 (!) 148/84  Pulse:  69  73  Resp:      Temp: 98.1 F (36.7 C)   98.3 F (36.8 C)  TempSrc: Oral   Oral  SpO2: 98%  97% 98%  Weight:    87.6 kg  Height:        Intake/Output Summary (Last 24 hours) at 09/25/2018 0757 Last data filed at 09/25/2018 0300 Gross per 24 hour  Intake 793.43 ml  Output -  Net 793.43 ml   Last 3 Weights 09/25/2018 09/24/2018 09/19/2018  Weight (lbs) 193 lb 3.2 oz 193 lb 196 lb 9.6 oz  Weight (kg) 87.635 kg 87.544 kg 89.177 kg      Telemetry    SR - Personally Reviewed  ECG    SR non specific T wave abnormality  - Personally Reviewed  Physical Exam   GEN: No acute distress.   Neck: No JVD Cardiac: RRR, no murmurs, rubs, or gallops. Rt wrist stable at cath site Respiratory: Clear to auscultation bilaterally. GI: Soft, nontender, non-distended  MS: No edema; No deformity. Neuro:  Nonfocal  Psych: Normal affect   Labs    High Sensitivity Troponin:  No results for input(s): TROPONINIHS in the last 720 hours.    Cardiac EnzymesNo results for input(s): TROPONINI in the last 168 hours. No results for input(s): TROPIPOC in the last 168 hours.   Chemistry Recent  Labs  Lab 09/19/18 1143 09/25/18 0551  NA 144 138  K 4.1 3.5  CL 105 108  CO2 23 21*  GLUCOSE 95 137*  BUN 14 16  CREATININE 1.08* 0.86  CALCIUM 9.8 9.0  GFRNONAA 50* >60  GFRAA 58* >60  ANIONGAP  --  9     Hematology Recent Labs  Lab 09/19/18 1143 09/25/18 0551  WBC 8.7 11.7*  RBC 4.27 3.88  HGB 13.2 12.1  HCT 40.0 36.1  MCV 94 93.0  MCH 30.9 31.2  MCHC 33.0 33.5  RDW 12.1 13.5  PLT 297 263    BNPNo results for input(s): BNP, PROBNP in the last 168 hours.   DDimer No results for input(s): DDIMER in the last 168 hours.   Radiology    No results found.  Cardiac Studies   Cardiac cath  09/24/18  Mid RCA lesion is 50% stenosed.  Prox Cx lesion is 95% stenosed.  A drug-eluting stent was successfully placed using a STENT SYNERGY DES 2.5X16.  Post intervention, there is a 0% residual stenosis.  A stent was successfully placed.  Post intervention, there is a 0% residual stenosis.  Non-stenotic Mid LAD  lesion.   Successful proximal circumflex and mid LAD PCI drug-eluting stenting using synergy drug-eluting stents in the setting of chest pain and abnormal Myoview stress test.  She does have a 50% segmental mid RCA lesion that did not appear to be significant.  She will be hydrated overnight and discharged home in the morning on dual antiplatelet therapy that she will need to continue uninterrupted for 12 months  Patient Profile     78 y.o. female with hx of chest discomfort and palpitations.  Also HLD, HTN, hypothyroidism eventual lexiscan myoivew and + for ischemia  a medium defect of moderate severity present in the mid anterior septal, mid inferior lateral, apical septal and apical lateral location consistent with ischemia. EF 73%. Wearing ZIO monitor.     Assessment & Plan    Angina with + nuc study leading to cardiac cath and stents to pLCX and mLAD stents DES.  ASA and Plavix for 12 months.  Though with addition of anticoagulation would stop ASA after 30  days.   CAD with stents as above and 50% segmental mRCA stenosis treated medically   PAF - needing anticoagulation. Now on BB 12.5 BID  Needs anticoagulation  CHA2DS2VASc of 5  Will begin eliquis   HTN  continue amlodipine[ine, cozaar and BB 12.5 BID   HLD  last LDL at 105 in 07/2018  New goal at < 70  She has been on zocor 20 mg Tuesday Thursday and Sat.- she is agreeable to increase to daily.    Thyroid disease on replacement.    For questions or updates, please contact [place] Please consult [email or web address] for contact info under        Signed, Cecilie Kicks, NP  09/25/2018, 7:57 AM    I have personally seen and examined this patient. I agree with the assessment and plan as outlined above.  She is doing well post PCI . Will d/c home today on ASA, Plavix. Will start Eliquis today.   Lauree Chandler 09/25/2018 9:47 AM

## 2018-09-25 NOTE — Progress Notes (Signed)
CARDIAC REHAB PHASE I   PRE:  Rate/Rhythm: 82 SR  BP:  Supine: 128/79  Sitting:   Standing:    SaO2: 98%RA  MODE:  Ambulation: 350 ft   POST:  Rate/Rhythm: 111 ST  BP:  Supine:   Sitting: 152/89  Standing:    SaO2: 99%RA 0840-0950 Pt walked 350 ft on RA with steady gait. Knee bothering her a little this morning. Reviewed NTG use, importance of plavix with stent, heart healthy food choices, walking for ex, and CRP 2 [place].  Pt voiced understanding. Pt stated not good with Apps so will not refer for virtual ex program. No CP with activity. Pt has been under a great deal of stress this year with managing estate of daughter.    Graylon Good, RN BSN  09/25/2018 9:45 AM
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
## 2018-09-25 NOTE — Discharge Summary (Signed)
Discharge Summary    Patient ID: Tamara Larson MRN: [id number]; DOB: 1940/10/12  Admit date: 09/24/2018 Discharge date: 09/25/2018  Primary Care Provider: Lauree Chandler, NP  Primary Cardiologist: Quay Burow, MD  Primary Electrophysiologist:  None   Discharge Diagnoses    Principal Problem:   Abnormal stress test Active Problems:   Thyroid disease   HTN (hypertension)   Hyperlipidemia LDL goal <100   Chest pain of uncertain etiology   CAD in native artery   PAF (paroxysmal atrial fibrillation) (HCC)   Anticoagulation adequate   Allergies Allergies  Allergen Reactions   Shellfish Allergy Swelling    Diagnostic Studies/Procedures     Mid RCA lesion is 50% stenosed.  Prox Cx lesion is 95% stenosed.  A drug-eluting stent was successfully placed using a STENT SYNERGY DES 2.5X16.  Post intervention, there is a 0% residual stenosis.  A stent was successfully placed.  Post intervention, there is a 0% residual stenosis.  Non-stenotic Mid LAD lesion.    IMPRESSION: Successful proximal circumflex and mid LAD PCI drug-eluting stenting using synergy drug-eluting stents in the setting of chest pain and abnormal Myoview stress test.  She does have a 50% segmental mid RCA lesion that did not appear to be significant.  She will be hydrated overnight and discharged home in the morning on dual antiplatelet therapy that she will need to continue uninterrupted for 12 months.  Quay Burow. MD, [facility] Diagnostic Dominance: Right  Intervention    _____________   History of Present Illness     78 y.o. female who presents for ongoing assessment and management of chest discomfort and palpitations.  Other history includes hyperlipidemia, hypertension, and hypothyroidism.  She was seen initially by Dr. Alvester Chou for evaluation of pulseless lower extremity without claudication symptoms.  Her ABI was found to be normal on 08/04/2016.  Due to complaints of chest pain, she was  planned for a coronary CTA however her heart rate was too elevated around 120 bpm with concerns of possible atrial fibrillation EKG was not completed to confirm rhythm.  It was noted that the patient was complaining of more fatigue and dyspnea on exertion.  Eventual nuc study was performed and pt with + ischemia.  She was seen back in the office and plans were made for cardiac cath.  Also she was found to have PAF on zio patch, symptoms have been controlled on lopressor 12.5 BID.  Plan was to cath then add anticoagulation.  Pt presented for elective cath and found to have CAD and underwent PCI to LAD and LCX see above and admitted overnight for monitoring.    Hospital Course     Consultants: none  Pt did well post cath, no chest pain.  She was able to ambulate with cardiac rehab.  We discussed her statin and she would like to try zocor daily for now. Will need recheck in 6 weeks - her new LDL goal is <70.  If she has side effects will need different medication.  Her K+ was replaced prior to discharge.      She will be on eliquis, plavix and asa for 1 month then stop ASA.  She will mail in zio patch tomorrow.   _____________  Discharge Vitals Blood pressure (!) 148/84, pulse 95, temperature 98.3 F (36.8 C), temperature source Oral, resp. rate 15, height 5\' 7"  (1.702 m), weight 87.6 kg, SpO2 98 %.  Filed Weights   09/24/18 1134 09/25/18 0442  Weight: 87.5 kg 87.6 kg  Labs & Radiologic Studies    CBC Recent Labs    09/25/18 0551  WBC 11.7*  HGB 12.1  HCT 36.1  MCV 93.0  PLT 623   Basic Metabolic Panel Recent Labs    09/25/18 0551  NA 138  K 3.5  CL 108  CO2 21*  GLUCOSE 137*  BUN 16  CREATININE 0.86  CALCIUM 9.0   Liver Function Tests No results for input(s): AST, ALT, ALKPHOS, BILITOT, PROT, ALBUMIN in the last 72 hours. No results for input(s): LIPASE, AMYLASE in the last 72 hours. Cardiac Enzymes No results for input(s): CKTOTAL, CKMB, CKMBINDEX, TROPONINI in  the last 72 hours. BNP Invalid input(s): POCBNP D-Dimer No results for input(s): DDIMER in the last 72 hours. Hemoglobin A1C No results for input(s): HGBA1C in the last 72 hours. Fasting Lipid Panel No results for input(s): CHOL, HDL, LDLCALC, TRIG, CHOLHDL, LDLDIRECT in the last 72 hours. Thyroid Function Tests No results for input(s): TSH, T4TOTAL, T3FREE, THYROIDAB in the last 72 hours.  Invalid input(s): FREET3 _____________  No results found. Disposition   Pt is being discharged home today in good condition.  Follow-up Plans & Appointments   Heart healthy Diet  Call [facility] at [phone] if any bleeding, swelling or drainage at cath site.  May shower, no tub baths for 48 hours for groin sticks. No lifting over 5 pounds for 3 days.  No Driving for 3 days  Call if any bleeding at cath site or anywhere,  You are on 3 medications that can cause bleeding.  In 30 days you can stop the ASA.    Take 1 NTG, under your tongue, while sitting.  If no relief of pain may repeat NTG, one tab every 5 minutes up to 3 tablets total over 15 minutes.  If no relief CALL 911.  If you have dizziness/lightheadness  while taking NTG, stop taking and call 911.          Follow-up Information    Erlene Quan, PA-C Follow up on 10/05/2018.   Specialties: Cardiology, Radiology Why: at 3 pm iwth Dr. Kennon Holter PA  Contact information: [address] [place] [address] [phone]          Discharge Instructions    Amb Referral to Cardiac Rehabilitation   Complete by: As directed    Diagnosis: Coronary Stents   After initial evaluation and assessments completed: Virtual Based Care may be provided alone or in conjunction with Phase 2 Cardiac Rehab based on patient barriers.: Yes      Discharge Medications   Allergies as of 09/25/2018      Reactions   Shellfish Allergy Swelling      Medication List    STOP taking these medications   cimetidine 200 MG  tablet Commonly known as: TAGAMET     TAKE these medications   acetaminophen 650 MG CR tablet Commonly known as: TYLENOL Take 650 mg by mouth every 8 (eight) hours as needed for pain.   amLODipine 10 MG tablet Commonly known as: NORVASC Take 1 tablet (10 mg total) by mouth daily. What changed: when to take this   apixaban 5 MG Tabs tablet Commonly known as: ELIQUIS Take 1 tablet (5 mg total) by mouth 2 (two) times daily.   aspirin EC 81 MG tablet Take 81 mg by mouth daily.   clopidogrel 75 MG tablet Commonly known as: PLAVIX Take 1 tablet (75 mg total) by mouth daily with breakfast. Start taking on:  September 26, 2018   famotidine 20 MG tablet Commonly known as: PEPCID Take 1 tablet (20 mg total) by mouth daily as needed for heartburn or indigestion.   levothyroxine 100 MCG tablet Commonly known as: SYNTHROID Take 1 tablet (100 mcg total) by mouth daily before breakfast.   losartan 100 MG tablet Commonly known as: COZAAR TAKE 1 TABLET BY MOUTH EVERY DAY   metoprolol tartrate 25 MG tablet Commonly known as: LOPRESSOR TAKE 1/2 TABLET TWICE A DAY What changed: when to take this   multivitamin with minerals Tabs tablet Take 1 tablet by mouth daily. Centrum Silver   nitroGLYCERIN 0.4 MG SL tablet Commonly known as: NITROSTAT Place 1 tablet (0.4 mg total) under the tongue every 5 (five) minutes as needed for chest pain.   simvastatin 20 MG tablet Commonly known as: ZOCOR TAKE 1 TABLET BY MOUTH DAILY. TAKE ONE TABLET BY MOUTH ON TUESDAY, THURSDAY AND SATURDAY What changed: See the new instructions.        Acute coronary syndrome (MI, NSTEMI, STEMI, etc) this admission?:  No.     Outstanding Labs/Studies   Hepatic and lipids in 6 weeks.   Duration of Discharge Encounter   Greater than 30 minutes including physician time.  Signed, Cecilie Kicks, NP 09/25/2018, 10:28 AM

## 2018-09-25 NOTE — Discharge Instructions (Addendum)
Heart healthy Diet  Call [facility] at [phone] if any bleeding, swelling or drainage at cath site.  May shower, no tub baths for 48 hours for groin sticks. No lifting over 5 pounds for 3 days.  No Driving for 3 days  Call if any bleeding at cath site or anywhere,  You are on 3 medications that can cause bleeding.  In 30 days you can stop the ASA.    ================================================================================  Information on my medicine - ELIQUIS (apixaban)  This medication education was reviewed with me or my healthcare representative as part of my discharge preparation.  The pharmacist that spoke with me during my hospital stay was:  Maryan Char Lippucci, RPH  Why was Eliquis prescribed for you? Eliquis was prescribed for you to reduce the risk of a blood clot forming that can cause a stroke if you have a medical condition called atrial fibrillation (a type of irregular heartbeat).  What do You need to know about Eliquis ? Take your Eliquis TWICE DAILY - one tablet in the morning and one tablet in the evening with or without food. If you have difficulty swallowing the tablet whole please discuss with your pharmacist how to take the medication safely.  Take Eliquis exactly as prescribed by your doctor and DO NOT stop taking Eliquis without talking to the doctor who prescribed the medication.  Stopping may increase your risk of developing a stroke.  Refill your prescription before you run out.  After discharge, you should have regular check-up appointments with your healthcare provider that is prescribing your Eliquis.  In the future your dose may need to be changed if your kidney function or weight changes by a significant amount or as you get older.  What do you do if you miss a dose? If you miss a dose, take it as soon as you remember on the same day and resume taking twice daily.  Do not take more than one dose of ELIQUIS at the same time to make up  a missed dose.  Important Safety Information A possible side effect of Eliquis is bleeding. You should call your healthcare provider right away if you experience any of the following: ? Bleeding from an injury or your nose that does not stop. ? Unusual colored urine (red or dark brown) or unusual colored stools (red or black). ? Unusual bruising for unknown reasons. ? A serious fall or if you hit your head (even if there is no bleeding).  Some medicines may interact with Eliquis and might increase your risk of bleeding or clotting while on Eliquis. To help avoid this, consult your healthcare provider or pharmacist prior to using any new prescription or non-prescription medications, including herbals, vitamins, non-steroidal anti-inflammatory drugs (NSAIDs) and supplements.  This website has more information on Eliquis (apixaban): http://www.eliquis.com/eliquis/home  Take 1 NTG, under your tongue, while sitting.  If no relief of pain may repeat NTG, one tab every 5 minutes up to 3 tablets total over 15 minutes.  If no relief CALL 911.  If you have dizziness/lightheadness  while taking NTG, stop taking and call 911.          Information on my medicine - ELIQUIS (apixaban)  This medication education was reviewed with me or my healthcare representative as part of my discharge preparation.    Why was Eliquis prescribed for you? Eliquis was prescribed for you to reduce the risk of a blood clot forming that can cause a stroke if you have a  medical condition called atrial fibrillation (a type of irregular heartbeat).  What do You need to know about Eliquis ? Take your Eliquis TWICE DAILY - one tablet in the morning and one tablet in the evening with or without food. If you have difficulty swallowing the tablet whole please discuss with your pharmacist how to take the medication safely.  Take Eliquis exactly as prescribed by your doctor and DO NOT stop taking Eliquis without talking to  the doctor who prescribed the medication.  Stopping may increase your risk of developing a stroke.  Refill your prescription before you run out.  After discharge, you should have regular check-up appointments with your healthcare provider that is prescribing your Eliquis.  In the future your dose may need to be changed if your kidney function or weight changes by a significant amount or as you get older.  What do you do if you miss a dose? If you miss a dose, take it as soon as you remember on the same day and resume taking twice daily.  Do not take more than one dose of ELIQUIS at the same time to make up a missed dose.  Important Safety Information A possible side effect of Eliquis is bleeding. You should call your healthcare provider right away if you experience any of the following: ? Bleeding from an injury or your nose that does not stop. ? Unusual colored urine (red or dark brown) or unusual colored stools (red or black). ? Unusual bruising for unknown reasons. ? A serious fall or if you hit your head (even if there is no bleeding).  Some medicines may interact with Eliquis and might increase your risk of bleeding or clotting while on Eliquis. To help avoid this, consult your healthcare provider or pharmacist prior to using any new prescription or non-prescription medications, including herbals, vitamins, non-steroidal anti-inflammatory drugs (NSAIDs) and supplements.  This website has more information on Eliquis (apixaban): http://www.eliquis.com/eliquis/home

## 2018-10-01 ENCOUNTER — Other Ambulatory Visit: Payer: Self-pay | Admitting: Nurse Practitioner

## 2018-10-02 ENCOUNTER — Other Ambulatory Visit: Payer: Medicare HMO

## 2018-10-02 ENCOUNTER — Other Ambulatory Visit: Payer: Self-pay

## 2018-10-02 DIAGNOSIS — E039 Hypothyroidism, unspecified: Secondary | ICD-10-CM

## 2018-10-03 ENCOUNTER — Telehealth: Payer: Self-pay

## 2018-10-03 DIAGNOSIS — E039 Hypothyroidism, unspecified: Secondary | ICD-10-CM

## 2018-10-03 LAB — TSH: TSH: 0.34 mIU/L — ABNORMAL LOW (ref 0.40–4.50)

## 2018-10-03 MED ORDER — LEVOTHYROXINE SODIUM 88 MCG PO TABS: 88.0000 ug | ORAL_TABLET | Freq: Every day | ORAL | 3 refills | Status: DC

## 2018-10-03 NOTE — Telephone Encounter (Signed)
-----   Message from Lauree Chandler, NP sent at 10/03/2018  8:42 AM EDT ----- TSH is low, lets confirm that she has been taking the 100 mcg for at least 8 weeks, if so needs to reduce synthroid to 88 mcg and follow up TSH in 2 months

## 2018-10-04 ENCOUNTER — Other Ambulatory Visit: Payer: Self-pay | Admitting: Cardiovascular Disease

## 2018-10-04 DIAGNOSIS — R002 Palpitations: Secondary | ICD-10-CM

## 2018-10-05 ENCOUNTER — Ambulatory Visit: Payer: Medicare HMO | Admitting: Cardiology

## 2018-10-05 ENCOUNTER — Other Ambulatory Visit: Payer: Self-pay

## 2018-10-05 ENCOUNTER — Encounter: Payer: Self-pay | Admitting: Cardiology

## 2018-10-05 VITALS — BP 120/76 | HR 74 | Temp 97.0°F | Ht 67.0 in | Wt 194.2 lb

## 2018-10-05 DIAGNOSIS — I480 Paroxysmal atrial fibrillation: Secondary | ICD-10-CM

## 2018-10-05 DIAGNOSIS — I10 Essential (primary) hypertension: Secondary | ICD-10-CM

## 2018-10-05 DIAGNOSIS — I2510 Atherosclerotic heart disease of native coronary artery without angina pectoris: Secondary | ICD-10-CM

## 2018-10-05 DIAGNOSIS — E039 Hypothyroidism, unspecified: Secondary | ICD-10-CM

## 2018-10-05 DIAGNOSIS — Z9861 Coronary angioplasty status: Secondary | ICD-10-CM

## 2018-10-05 DIAGNOSIS — E785 Hyperlipidemia, unspecified: Secondary | ICD-10-CM

## 2018-10-05 DIAGNOSIS — Z7901 Long term (current) use of anticoagulants: Secondary | ICD-10-CM

## 2018-10-05 NOTE — Assessment & Plan Note (Signed)
TSH low (0.34) 10/02/2018- PCP following- this could explain PAF documented in July

## 2018-10-05 NOTE — Patient Instructions (Signed)
Medication Instructions:  YOU CAN STOP ASPIRIN IN 30 DAYS  If you need a refill on your cardiac medications before your next appointment, please call your pharmacy.   Lab work: NONE  If you have labs (blood work) drawn today and your tests are completely normal, you will receive your results only by: Marland Kitchen MyChart Message (if you have MyChart) OR . A paper copy in the mail If you have any lab test that is abnormal or we need to change your treatment, we will call you to review the results.  Testing/Procedures: NONE   Follow-Up: At [facility], you and your health needs are our priority.  As part of our continuing mission to provide you with exceptional heart care, we have created designated Provider Care Teams.  These Care Teams include your primary Cardiologist (physician) and Advanced Practice Providers (APPs -  Physician Assistants and Nurse Practitioners) who all work together to provide you with the care you need, when you need it.  Marland Kitchen [facility] RECOMMENDS YOU FOLLOW UP AS SCHEDULED WITH DR Gwenlyn Found  Any Other Special Instructions Will Be Listed Below (If Applicable).

## 2018-10-05 NOTE — Assessment & Plan Note (Signed)
CHADS VASC=4 

## 2018-10-05 NOTE — Assessment & Plan Note (Signed)
Cath- mCFX PCI with DES 09/25/2018 after abnormal Myoview Residual 50% RCA

## 2018-10-05 NOTE — Assessment & Plan Note (Signed)
Documented on monitor July 2020-Eliquis added post PCI

## 2018-10-05 NOTE — Assessment & Plan Note (Signed)
Controlled.  
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
## 2018-10-05 NOTE — Assessment & Plan Note (Deleted)
(  0.34) 10/02/2018- PCP following

## 2018-10-05 NOTE — Assessment & Plan Note (Signed)
On statin 3 times a week

## 2018-10-19 ENCOUNTER — Ambulatory Visit
Admission: RE | Admit: 2018-10-19 | Discharge: 2018-10-19 | Disposition: A | Discharge status: Home / Self Care | Payer: Medicare HMO | Source: Ambulatory Visit | Attending: Nurse Practitioner | Admitting: Nurse Practitioner

## 2018-10-19 ENCOUNTER — Other Ambulatory Visit: Payer: Self-pay

## 2018-10-19 DIAGNOSIS — E2839 Other primary ovarian failure: Secondary | ICD-10-CM

## 2018-10-19 DIAGNOSIS — Z1231 Encounter for screening mammogram for malignant neoplasm of breast: Secondary | ICD-10-CM

## 2018-10-23 ENCOUNTER — Ambulatory Visit: Payer: Medicare HMO | Admitting: Cardiovascular Disease

## 2018-10-23 ENCOUNTER — Other Ambulatory Visit: Payer: Self-pay

## 2018-10-23 ENCOUNTER — Encounter: Payer: Self-pay | Admitting: Cardiovascular Disease

## 2018-10-23 DIAGNOSIS — I10 Essential (primary) hypertension: Secondary | ICD-10-CM

## 2018-10-23 DIAGNOSIS — Z9861 Coronary angioplasty status: Secondary | ICD-10-CM

## 2018-10-23 DIAGNOSIS — I2510 Atherosclerotic heart disease of native coronary artery without angina pectoris: Secondary | ICD-10-CM

## 2018-10-23 DIAGNOSIS — E785 Hyperlipidemia, unspecified: Secondary | ICD-10-CM

## 2018-10-23 DIAGNOSIS — I480 Paroxysmal atrial fibrillation: Secondary | ICD-10-CM

## 2018-10-23 NOTE — Assessment & Plan Note (Signed)
History of hyperlipidemia on statin therapy..  Previously she was on simvastatin 3 times a week with lipid profile performed 08/02/2018 revealing total cholesterol 189, LDL 105 and HDL 64.  Since that time she is gone to taking her drug daily.  We will recheck a lipid liver profile in 2 months.

## 2018-10-23 NOTE — Progress Notes (Signed)
10/23/2018 Tamara Larson   1940/08/08  [id number]  Primary Physician Dewaine Oats Carlos American, NP Primary Cardiologist: Lorretta Harp MD Lupe Carney, [place]  HPI:  Tamara Larson is a 78 y.o.  moderately overweight widowed African-American female mother of one child, grandmother of 3 grandchildren referred by Dr. Amalia Hailey for peripheral vascular evaluation because of absent pedal pulses on exam.  I last saw her in the office 08/11/2017.  She has a history of treated hypertension and hyperlipidemia. She was apparently seen for ingrown toenails and apparently feel pulses were not able to be felt although she denies claudication. She had Dopplers that showed normal ABIs. He she does get some mild swelling in her legs on occasion which improved with compression stockings She was recently seen in the emergency room for atypical chest pain thought to be related to reflux and was treated with a antacid which has improved her symptoms.  She also had a 2D echocardiogram performed 07/28/2017 which was essentially normal except for some calcified mitral leaflets without evidence of stenosis or regurgitation.  She was scheduled to have a coronary CTA but this could not be done because of her heart rate.  She therefore was set up to have an outpatient cath which I performed on 09/24/2018 via the right radial approach revealing high-grade proximal LAD and mid circumflex stenosis which I stented using drug-eluting stents.  She had a 50% mid RCA lesion which was treated medically.  She was on triple therapy for a month after which she discontinued aspirin.  Should be noted that she had an event monitor performed 09/06/2018 that did show PAF for which she was placed on Eliquis oral anticoagulation and low-dose plate beta-blockade.   Current Meds  Medication Sig  . acetaminophen (TYLENOL) 650 MG CR tablet Take 650 mg by mouth every 8 (eight) hours as needed for pain.  Marland Kitchen amLODipine (NORVASC) 10 MG tablet TAKE 1  TABLET BY MOUTH EVERY DAY  . apixaban (ELIQUIS) 5 MG TABS tablet Take 1 tablet (5 mg total) by mouth 2 (two) times daily.  Marland Kitchen aspirin EC 81 MG tablet Take 81 mg by mouth daily.  . clopidogrel (PLAVIX) 75 MG tablet Take 1 tablet (75 mg total) by mouth daily with breakfast.  . famotidine (PEPCID) 20 MG tablet Take 1 tablet (20 mg total) by mouth daily as needed for heartburn or indigestion.  Marland Kitchen levothyroxine (SYNTHROID) 88 MCG tablet Take 1 tablet (88 mcg total) by mouth daily.  Marland Kitchen losartan (COZAAR) 100 MG tablet TAKE 1 TABLET BY MOUTH EVERY DAY  . metoprolol tartrate (LOPRESSOR) 25 MG tablet TAKE 1/2 TABLET TWICE A DAY (Patient taking differently: Take 12.5 mg by mouth 2 (two) times a day. )  . Multiple Vitamin (MULTIVITAMIN WITH MINERALS) TABS tablet Take 1 tablet by mouth daily. Centrum Silver  . nitroGLYCERIN (NITROSTAT) 0.4 MG SL tablet Place 1 tablet (0.4 mg total) under the tongue every 5 (five) minutes as needed for chest pain.  . simvastatin (ZOCOR) 20 MG tablet TAKE 1 TABLET BY MOUTH DAILY. TAKE ONE TABLET BY MOUTH ON TUESDAY, THURSDAY AND SATURDAY     Allergies  Allergen Reactions  . Shellfish Allergy Swelling    Social History   Socioeconomic History  . Marital status: Widowed    Spouse name: Not on file  . Number of children: Not on file  . Years of education: Not on file  . Highest education level: Not on file  Occupational History  .  Not on file  Social Needs  . Financial resource strain: Not on file  . Food insecurity    Worry: Not on file    Inability: Not on file  . Transportation needs    Medical: Not on file    Non-medical: Not on file  Tobacco Use  . Smoking status: Former Smoker    Packs/day: 0.00  . Smokeless tobacco: Never Used  . Tobacco comment: Quit about age 46   Substance and Sexual Activity  . Alcohol use: No    Alcohol/week: 0.0 standard drinks  . Drug use: No  . Sexual activity: Never    Birth control/protection: Post-menopausal, Surgical     Comment: 1st intercourse 78 yo-Fewer than 5 partners  Lifestyle  . Physical activity    Days per week: Not on file    Minutes per session: Not on file  . Stress: Not on file  Relationships  . Social Herbalist on phone: Not on file    Gets together: Not on file    Attends religious service: Not on file    Active member of club or organization: Not on file    Attends meetings of clubs or organizations: Not on file    Relationship status: Not on file  . Intimate partner violence    Fear of current or ex partner: Not on file    Emotionally abused: Not on file    Physically abused: Not on file    Forced sexual activity: Not on file  Other Topics Concern  . Not on file  Social History Narrative   Do you drink/eat things with caffeine? Yes, coffee   Was married x 26 years, widow   Lives in a one stories house, one person, no pets   Past/Current profession- Sales executive   Patient exercises daily      Review of Systems: General: negative for chills, fever, night sweats or weight changes.  Cardiovascular: negative for chest pain, dyspnea on exertion, edema, orthopnea, palpitations, paroxysmal nocturnal dyspnea or shortness of breath Dermatological: negative for rash Respiratory: negative for cough or wheezing Urologic: negative for hematuria Abdominal: negative for nausea, vomiting, diarrhea, bright red blood per rectum, melena, or hematemesis Neurologic: negative for visual changes, syncope, or dizziness All other systems reviewed and are otherwise negative except as noted above.    Pulse 81, temperature (!) 96.5 F (35.8 C), height 5\' 7"  (1.702 m), weight 189 lb 9.6 oz (86 kg), SpO2 97 %.  General appearance: alert and no distress Neck: no adenopathy, no carotid bruit, no JVD, supple, symmetrical, trachea midline and thyroid not enlarged, symmetric, no tenderness/mass/nodules Lungs: clear to auscultation bilaterally Heart: regular rate and rhythm, S1, S2  normal, no murmur, click, rub or gallop Extremities: extremities normal, atraumatic, no cyanosis or edema Pulses: 2+ and symmetric Skin: Skin color, texture, turgor normal. No rashes or lesions Neurologic: Alert and oriented X 3, normal strength and tone. Normal symmetric reflexes. Normal coordination and gait  EKG not performed today  ASSESSMENT AND PLAN:   Dyslipidemia, goal LDL below 70 History of hyperlipidemia on statin therapy..  Previously she was on simvastatin 3 times a week with lipid profile performed 08/02/2018 revealing total cholesterol 189, LDL 105 and HDL 64.  Since that time she is gone to taking her drug daily.  We will recheck a lipid liver profile in 2 months.  Essential hypertension History of essential hypertension on amlodipine and metoprolol.  Her blood pressure was not measured today.  CAD S/P PCI History of CAD status post circumflex and LAD PCI and stenting by myself via the right radial approach 09/24/2018.  She did have a residual 50% mid RCA stenosis.  She was on "triple therapy" for a month and her aspirin was recently discontinued.  She remains on clopidogrel and Eliquis.  PAF (paroxysmal atrial fibrillation) (HCC) History of PAF maintaining sinus rhythm on metoprolol and Eliquis.  He was brought up the some of this may be contributed to by her thyroid replacement therapy which she has had reduction in her dose.  Although, when she missed a dose of metoprolol she was awakened at night with tachypalpitations.      Lorretta Harp MD FACP,FACC,FAHA, [facility] 10/23/2018 2:56 PM

## 2018-10-23 NOTE — Patient Instructions (Addendum)
Medication Instructions:  Your physician recommends that you continue on your current medications as directed. Please refer to the Current Medication list given to you today.  If you need a refill on your cardiac medications before your next appointment, please call your pharmacy.   Lab work: Your physician recommends that you return for lab work in: 2 months (lipid and liver panels)  If you have labs (blood work) drawn today and your tests are completely normal, you will receive your results only by: Marland Kitchen MyChart Message (if you have MyChart) OR . A paper copy in the mail If you have any lab test that is abnormal or we need to change your treatment, we will call you to review the results.  Testing/Procedures: none  Follow-Up: At [facility], you and your health needs are our priority.  As part of our continuing mission to provide you with exceptional heart care, we have created designated Provider Care Teams.  These Care Teams include your primary Cardiologist (physician) and Advanced Practice Providers (APPs -  Physician Assistants and Nurse Practitioners) who all work together to provide you with the care you need, when you need it. You will need a follow up appointment in 6 months with Kerin Ransom, PA-C AND IN 12 MONTHS with Dr. Quay Burow.  Please call our office 2 months in advance to schedule each appointment.    Please complete your portion of the Patient Assistance form for Eliquis and return it to the office.

## 2018-10-23 NOTE — Assessment & Plan Note (Signed)
History of CAD status post circumflex and LAD PCI and stenting by myself via the right radial approach 09/24/2018.  She did have a residual 50% mid RCA stenosis.  She was on "triple therapy" for a month and her aspirin was recently discontinued.  She remains on clopidogrel and Eliquis.

## 2018-10-23 NOTE — Assessment & Plan Note (Signed)
History of essential hypertension on amlodipine and metoprolol.  Her blood pressure was not measured today.

## 2018-10-23 NOTE — Assessment & Plan Note (Signed)
History of PAF maintaining sinus rhythm on metoprolol and Eliquis.  He was brought up the some of this may be contributed to by her thyroid replacement therapy which she has had reduction in her dose.  Although, when she missed a dose of metoprolol she was awakened at night with tachypalpitations.

## 2018-10-25 ENCOUNTER — Other Ambulatory Visit: Payer: Self-pay | Admitting: Physician Assistant

## 2018-10-25 ENCOUNTER — Telehealth: Payer: Self-pay | Admitting: Cardiovascular Disease

## 2018-10-25 NOTE — Telephone Encounter (Signed)
Pt informed office is currently out of samples. Pt voiced understanding.

## 2018-10-25 NOTE — Telephone Encounter (Signed)
Patient calling the office for samples of medication:   1.  What medication and dosage are you requesting samples for? apixaban (ELIQUIS) 5 MG TABS tablet  2.  Are you currently out of this medication? Not yet   Patient went to fill her Eliquis using the discount card and was told by the pharmacist that she had already used the card because they used it when she got the medication after her hospital stay. Patient will be bringing in a form to be filled out to get assistance for medication. She needs enough samples to get her through till she can get this done. She will be dropping the form by today.

## 2018-10-26 ENCOUNTER — Telehealth: Payer: Self-pay

## 2018-10-26 NOTE — Telephone Encounter (Signed)
Pt assistance for Eliquis faxed on 10/26/2018 to [phone]

## 2018-10-30 ENCOUNTER — Telehealth: Payer: Self-pay | Admitting: Cardiovascular Disease

## 2018-10-30 NOTE — Telephone Encounter (Signed)
Called patient, she was advised that we are currently out of samples.  But patient states that she filled out for patient assistance, but has not heard back- it was faxed over 08/28 by primary nurse. Will route to see if she has heard anything regarding this?

## 2018-10-30 NOTE — Telephone Encounter (Signed)
New message ° ° ° °Patient calling the office for samples of medication: ° ° °1.  What medication and dosage are you requesting samples for? apixaban (ELIQUIS) 5 MG TABS tablet ° °2.  Are you currently out of this medication? no ° ° °

## 2018-11-09 NOTE — Telephone Encounter (Signed)
[place] at [phone]. Spoke with rept who stated pt initially denied d/t needing proof of OOP expenses 'from 02/28/2018 to current' totaling $726.18. She stated pt would need to submit this for decision to be made.  Contacted pt. dpr on file. LVM advising pt to provide proof of OOP expenses for PA to make decision and may contact office with any questions

## 2018-11-14 ENCOUNTER — Telehealth: Payer: Self-pay | Admitting: Cardiovascular Disease

## 2018-11-14 NOTE — Telephone Encounter (Signed)
The patient has been notified that the office does not have any samples at this time. She stated that she cannot afford to pay $300 for Eliquis and was recently denied assistance. She would like to know what other options there are for her.

## 2018-11-14 NOTE — Telephone Encounter (Signed)
New message ° ° ° °Patient calling the office for samples of medication: ° ° °1.  What medication and dosage are you requesting samples for? apixaban (ELIQUIS) 5 MG TABS tablet ° °2.  Are you currently out of this medication? no ° ° °

## 2018-11-16 NOTE — Telephone Encounter (Signed)
dpr on file. Contacted pt no answer. LVM stating that nurse calling regarding the information below from 11/09/2018 telephone encounter:  "[employer] at [phone]. Spoke with rept who stated pt initially denied d/t needing proof of OOP expenses 'from 02/28/2018 to current' totaling $726.18. She stated pt would need to submit this for decision to be made.  Contacted pt. dpr on file. LVM advising pt to provide proof of OOP expenses for PA to make decision and may contact office with any questions"   Advised pt to call  [place] [employer] at [phone] or contact office at [phone]

## 2018-11-18 ENCOUNTER — Observation Stay (HOSPITAL_COMMUNITY)
Admission: EM | Admit: 2018-11-18 | Discharge: 2018-11-19 | Disposition: A | Discharge status: Home / Self Care | Payer: Medicare HMO | Attending: Internal Medicine | Admitting: Internal Medicine

## 2018-11-18 ENCOUNTER — Encounter (HOSPITAL_COMMUNITY): Payer: Self-pay | Admitting: Emergency Medicine

## 2018-11-18 ENCOUNTER — Other Ambulatory Visit: Payer: Self-pay

## 2018-11-18 ENCOUNTER — Emergency Department (HOSPITAL_COMMUNITY): Payer: Medicare HMO

## 2018-11-18 DIAGNOSIS — Z9851 Tubal ligation status: Secondary | ICD-10-CM | POA: Insufficient documentation

## 2018-11-18 DIAGNOSIS — Z79899 Other long term (current) drug therapy: Secondary | ICD-10-CM | POA: Diagnosis not present

## 2018-11-18 DIAGNOSIS — E785 Hyperlipidemia, unspecified: Secondary | ICD-10-CM | POA: Diagnosis not present

## 2018-11-18 DIAGNOSIS — Z955 Presence of coronary angioplasty implant and graft: Secondary | ICD-10-CM | POA: Insufficient documentation

## 2018-11-18 DIAGNOSIS — I2510 Atherosclerotic heart disease of native coronary artery without angina pectoris: Secondary | ICD-10-CM | POA: Diagnosis not present

## 2018-11-18 DIAGNOSIS — I6523 Occlusion and stenosis of bilateral carotid arteries: Secondary | ICD-10-CM | POA: Insufficient documentation

## 2018-11-18 DIAGNOSIS — K219 Gastro-esophageal reflux disease without esophagitis: Secondary | ICD-10-CM | POA: Insufficient documentation

## 2018-11-18 DIAGNOSIS — Z87828 Personal history of other (healed) physical injury and trauma: Secondary | ICD-10-CM | POA: Insufficient documentation

## 2018-11-18 DIAGNOSIS — S0101XA Laceration without foreign body of scalp, initial encounter: Secondary | ICD-10-CM | POA: Diagnosis not present

## 2018-11-18 DIAGNOSIS — Z7902 Long term (current) use of antithrombotics/antiplatelets: Secondary | ICD-10-CM | POA: Diagnosis not present

## 2018-11-18 DIAGNOSIS — W19XXXA Unspecified fall, initial encounter: Secondary | ICD-10-CM | POA: Insufficient documentation

## 2018-11-18 DIAGNOSIS — Z7901 Long term (current) use of anticoagulants: Secondary | ICD-10-CM | POA: Insufficient documentation

## 2018-11-18 DIAGNOSIS — E039 Hypothyroidism, unspecified: Secondary | ICD-10-CM | POA: Insufficient documentation

## 2018-11-18 DIAGNOSIS — Z87891 Personal history of nicotine dependence: Secondary | ICD-10-CM | POA: Insufficient documentation

## 2018-11-18 DIAGNOSIS — Z20828 Contact with and (suspected) exposure to other viral communicable diseases: Secondary | ICD-10-CM | POA: Diagnosis not present

## 2018-11-18 DIAGNOSIS — I420 Dilated cardiomyopathy: Secondary | ICD-10-CM | POA: Diagnosis not present

## 2018-11-18 DIAGNOSIS — R55 Syncope and collapse: Secondary | ICD-10-CM | POA: Diagnosis not present

## 2018-11-18 DIAGNOSIS — Z79890 Hormone replacement therapy (postmenopausal): Secondary | ICD-10-CM | POA: Diagnosis not present

## 2018-11-18 DIAGNOSIS — I081 Rheumatic disorders of both mitral and tricuspid valves: Secondary | ICD-10-CM | POA: Diagnosis not present

## 2018-11-18 DIAGNOSIS — I10 Essential (primary) hypertension: Secondary | ICD-10-CM | POA: Insufficient documentation

## 2018-11-18 DIAGNOSIS — E7800 Pure hypercholesterolemia, unspecified: Secondary | ICD-10-CM | POA: Diagnosis not present

## 2018-11-18 DIAGNOSIS — I480 Paroxysmal atrial fibrillation: Secondary | ICD-10-CM | POA: Insufficient documentation

## 2018-11-18 DIAGNOSIS — R2689 Other abnormalities of gait and mobility: Secondary | ICD-10-CM | POA: Diagnosis not present

## 2018-11-18 DIAGNOSIS — Z8249 Family history of ischemic heart disease and other diseases of the circulatory system: Secondary | ICD-10-CM | POA: Insufficient documentation

## 2018-11-18 DIAGNOSIS — E876 Hypokalemia: Secondary | ICD-10-CM | POA: Insufficient documentation

## 2018-11-18 LAB — CBC WITH DIFFERENTIAL/PLATELET
Abs Immature Granulocytes: 0.02 10*3/uL (ref 0.00–0.07)
Basophils Absolute: 0 10*3/uL (ref 0.0–0.1)
Basophils Relative: 0 %
Eosinophils Absolute: 0.1 10*3/uL (ref 0.0–0.5)
Eosinophils Relative: 1 %
HCT: 39.9 % (ref 36.0–46.0)
Hemoglobin: 12.9 g/dL (ref 12.0–15.0)
Immature Granulocytes: 0 %
Lymphocytes Relative: 50 %
Lymphs Abs: 3.3 10*3/uL (ref 0.7–4.0)
MCH: 30.9 pg (ref 26.0–34.0)
MCHC: 32.3 g/dL (ref 30.0–36.0)
MCV: 95.5 fL (ref 80.0–100.0)
Monocytes Absolute: 0.7 10*3/uL (ref 0.1–1.0)
Monocytes Relative: 10 %
Neutro Abs: 2.6 10*3/uL (ref 1.7–7.7)
Neutrophils Relative %: 39 %
Platelets: 229 10*3/uL (ref 150–400)
RBC: 4.18 MIL/uL (ref 3.87–5.11)
RDW: 13.7 % (ref 11.5–15.5)
WBC: 6.8 10*3/uL (ref 4.0–10.5)
nRBC: 0 % (ref 0.0–0.2)

## 2018-11-18 LAB — COMPREHENSIVE METABOLIC PANEL
ALT: 13 U/L (ref 0–44)
AST: 15 U/L (ref 15–41)
Albumin: 3.2 g/dL — ABNORMAL LOW (ref 3.5–5.0)
Alkaline Phosphatase: 84 U/L (ref 38–126)
Anion gap: 9 (ref 5–15)
BUN: 12 mg/dL (ref 8–23)
CO2: 21 mmol/L — ABNORMAL LOW (ref 22–32)
Calcium: 7.7 mg/dL — ABNORMAL LOW (ref 8.9–10.3)
Chloride: 114 mmol/L — ABNORMAL HIGH (ref 98–111)
Creatinine, Ser: 0.86 mg/dL (ref 0.44–1.00)
GFR calc Af Amer: >60 mL/min (ref >60)
GFR calc non Af Amer: >60 mL/min (ref >60)
Glucose, Bld: 124 mg/dL — ABNORMAL HIGH (ref 70–99)
Potassium: 2.3 mmol/L — CL (ref 3.5–5.1)
Sodium: 144 mmol/L (ref 135–145)
Total Bilirubin: 1.2 mg/dL (ref 0.3–1.2)
Total Protein: 6.1 g/dL — ABNORMAL LOW (ref 6.5–8.1)

## 2018-11-18 LAB — TROPONIN I (HIGH SENSITIVITY)
Troponin I (High Sensitivity): 7 ng/L (ref <18)
Troponin I (High Sensitivity): 10 ng/L (ref <18)

## 2018-11-18 LAB — SARS CORONAVIRUS 2 (TAT 6-24 HRS): SARS Coronavirus 2: NEGATIVE

## 2018-11-18 LAB — PROTIME-INR
INR: 1.3 — ABNORMAL HIGH (ref 0.8–1.2)
Prothrombin Time: 15.9 seconds — ABNORMAL HIGH (ref 11.4–15.2)

## 2018-11-18 LAB — MAGNESIUM: Magnesium: 1.5 mg/dL — ABNORMAL LOW (ref 1.7–2.4)

## 2018-11-18 LAB — TSH: TSH: 0.258 u[IU]/mL — ABNORMAL LOW (ref 0.350–4.500)

## 2018-11-18 LAB — CBG MONITORING, ED: Glucose-Capillary: 135 mg/dL — ABNORMAL HIGH (ref 70–99)

## 2018-11-18 MED ORDER — LEVOTHYROXINE SODIUM 88 MCG PO TABS
88.0000 ug | ORAL_TABLET | Freq: Every day | ORAL | Status: DC
  Administered 2018-11-19: 04:00:00 88 ug via ORAL
  Filled 2018-11-18: qty 1

## 2018-11-18 MED ORDER — MAGNESIUM SULFATE 2 GM/50ML IV SOLN
2.0000 g | Freq: Once | INTRAVENOUS | Status: AC
  Administered 2018-11-18: 2 g via INTRAVENOUS
  Filled 2018-11-18: qty 50

## 2018-11-18 MED ORDER — SIMVASTATIN 20 MG PO TABS
20.0000 mg | ORAL_TABLET | Freq: Every day | ORAL | Status: DC
  Administered 2018-11-18: 21:00:00 20 mg via ORAL
  Filled 2018-11-18: qty 1

## 2018-11-18 MED ORDER — FAMOTIDINE 20 MG PO TABS: 20.0000 mg | ORAL_TABLET | Freq: Every day | ORAL | Status: DC | PRN

## 2018-11-18 MED ORDER — POTASSIUM CHLORIDE 10 MEQ/100ML IV SOLN
10.0000 meq | INTRAVENOUS | Status: DC
  Administered 2018-11-18: 12:00:00 10 meq via INTRAVENOUS
  Filled 2018-11-18: qty 100

## 2018-11-18 MED ORDER — POTASSIUM CHLORIDE CRYS ER 20 MEQ PO TBCR
40.0000 meq | EXTENDED_RELEASE_TABLET | Freq: Once | ORAL | Status: AC
  Administered 2018-11-18: 40 meq via ORAL
  Filled 2018-11-18: qty 2

## 2018-11-18 MED ORDER — LEVOTHYROXINE SODIUM 88 MCG PO TABS: 88.0000 ug | ORAL_TABLET | Freq: Every day | ORAL | Status: DC

## 2018-11-18 MED ORDER — POTASSIUM CHLORIDE 10 MEQ/100ML IV SOLN
10.0000 meq | INTRAVENOUS | Status: AC
  Administered 2018-11-18 (×4): 10 meq via INTRAVENOUS
  Filled 2018-11-18 (×3): qty 100

## 2018-11-18 MED ORDER — POTASSIUM CHLORIDE CRYS ER 20 MEQ PO TBCR
40.0000 meq | EXTENDED_RELEASE_TABLET | ORAL | Status: AC
  Administered 2018-11-18: 40 meq via ORAL
  Filled 2018-11-18: qty 2

## 2018-11-18 MED ORDER — METOPROLOL TARTRATE 12.5 MG HALF TABLET
12.5000 mg | ORAL_TABLET | Freq: Two times a day (BID) | ORAL | Status: DC
  Administered 2018-11-18 – 2018-11-19 (×2): 12.5 mg via ORAL
  Filled 2018-11-18 (×3): qty 1

## 2018-11-18 MED ORDER — ACETAMINOPHEN 325 MG PO TABS: 650.0000 mg | ORAL_TABLET | Freq: Four times a day (QID) | ORAL | Status: DC | PRN

## 2018-11-18 MED ORDER — DILTIAZEM HCL 25 MG/5ML IV SOLN
10.0000 mg | Freq: Once | INTRAVENOUS | Status: AC
  Administered 2018-11-18: 10 mg via INTRAVENOUS
  Filled 2018-11-18: qty 5

## 2018-11-18 MED ORDER — ALBUTEROL SULFATE (2.5 MG/3ML) 0.083% IN NEBU: 2.5000 mg | INHALATION_SOLUTION | Freq: Four times a day (QID) | RESPIRATORY_TRACT | Status: DC | PRN

## 2018-11-18 MED ORDER — LOSARTAN POTASSIUM 50 MG PO TABS
100.0000 mg | ORAL_TABLET | Freq: Every day | ORAL | Status: DC
  Administered 2018-11-18 – 2018-11-19 (×2): 100 mg via ORAL
  Filled 2018-11-18 (×2): qty 2

## 2018-11-18 MED ORDER — CLOPIDOGREL BISULFATE 75 MG PO TABS
75.0000 mg | ORAL_TABLET | Freq: Every day | ORAL | Status: DC
  Administered 2018-11-18 – 2018-11-19 (×2): 75 mg via ORAL
  Filled 2018-11-18 (×2): qty 1

## 2018-11-18 MED ORDER — CLOPIDOGREL BISULFATE 75 MG PO TABS: 75.0000 mg | ORAL_TABLET | Freq: Every day | ORAL | Status: DC

## 2018-11-18 MED ORDER — AMLODIPINE BESYLATE 10 MG PO TABS
10.0000 mg | ORAL_TABLET | Freq: Every day | ORAL | Status: DC
  Administered 2018-11-19: 10 mg via ORAL
  Filled 2018-11-18: qty 1
  Filled 2018-11-18: qty 2

## 2018-11-18 MED ORDER — CALCIUM GLUCONATE-NACL 2-0.675 GM/100ML-% IV SOLN
2.0000 g | Freq: Once | INTRAVENOUS | Status: AC
  Administered 2018-11-18: 2000 mg via INTRAVENOUS
  Filled 2018-11-18: qty 100

## 2018-11-18 MED ORDER — APIXABAN 5 MG PO TABS
5.0000 mg | ORAL_TABLET | Freq: Two times a day (BID) | ORAL | Status: DC
  Administered 2018-11-18 – 2018-11-19 (×2): 5 mg via ORAL
  Filled 2018-11-18 (×4): qty 1

## 2018-11-18 MED ORDER — SODIUM CHLORIDE 0.9 % IV BOLUS
500.0000 mL | Freq: Once | INTRAVENOUS | Status: AC
  Administered 2018-11-18: 500 mL via INTRAVENOUS

## 2018-11-18 MED ORDER — ACETAMINOPHEN 650 MG RE SUPP: 650.0000 mg | Freq: Four times a day (QID) | RECTAL | Status: DC | PRN

## 2018-11-18 MED ORDER — SODIUM CHLORIDE 0.9% FLUSH
3.0000 mL | Freq: Two times a day (BID) | INTRAVENOUS | Status: DC
  Administered 2018-11-18 – 2018-11-19 (×2): 3 mL via INTRAVENOUS

## 2018-11-18 NOTE — ED Notes (Signed)
Calcium gluconate arrived from main pharm, handed to on coming RN.

## 2018-11-18 NOTE — ED Triage Notes (Signed)
Pt arrives via EMS from home with reports of a syncopal episode and fall today on porch. Pt on blood thinners. Pt does not remember the fall, A&Ox4. 4 mg Zofran given by EMS. A-fib with rate in 120-130s
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
## 2018-11-18 NOTE — ED Notes (Signed)
Admitting provider allowed pt to take eliquis from home and next dose will be rescheduled.

## 2018-11-18 NOTE — ED Notes (Addendum)
Pt ambulated to the restroom with the assistance of her daughter. Pt's gait was steady and slow with no complaints of lightheadedness or nausea when asked.

## 2018-11-19 ENCOUNTER — Ambulatory Visit (HOSPITAL_BASED_OUTPATIENT_CLINIC_OR_DEPARTMENT_OTHER): Payer: Medicare HMO

## 2018-11-19 ENCOUNTER — Observation Stay (HOSPITAL_COMMUNITY): Payer: Medicare HMO

## 2018-11-19 ENCOUNTER — Observation Stay (HOSPITAL_BASED_OUTPATIENT_CLINIC_OR_DEPARTMENT_OTHER): Payer: Medicare HMO

## 2018-11-19 DIAGNOSIS — I361 Nonrheumatic tricuspid (valve) insufficiency: Secondary | ICD-10-CM | POA: Diagnosis not present

## 2018-11-19 DIAGNOSIS — E785 Hyperlipidemia, unspecified: Secondary | ICD-10-CM

## 2018-11-19 DIAGNOSIS — Z9861 Coronary angioplasty status: Secondary | ICD-10-CM

## 2018-11-19 DIAGNOSIS — Z7901 Long term (current) use of anticoagulants: Secondary | ICD-10-CM

## 2018-11-19 DIAGNOSIS — R55 Syncope and collapse: Secondary | ICD-10-CM

## 2018-11-19 DIAGNOSIS — I340 Nonrheumatic mitral (valve) insufficiency: Secondary | ICD-10-CM | POA: Diagnosis not present

## 2018-11-19 DIAGNOSIS — I2510 Atherosclerotic heart disease of native coronary artery without angina pectoris: Secondary | ICD-10-CM | POA: Diagnosis not present

## 2018-11-19 DIAGNOSIS — E876 Hypokalemia: Secondary | ICD-10-CM | POA: Diagnosis not present

## 2018-11-19 DIAGNOSIS — I420 Dilated cardiomyopathy: Secondary | ICD-10-CM | POA: Diagnosis not present

## 2018-11-19 DIAGNOSIS — I10 Essential (primary) hypertension: Secondary | ICD-10-CM

## 2018-11-19 DIAGNOSIS — I480 Paroxysmal atrial fibrillation: Secondary | ICD-10-CM

## 2018-11-19 DIAGNOSIS — I081 Rheumatic disorders of both mitral and tricuspid valves: Secondary | ICD-10-CM | POA: Diagnosis not present

## 2018-11-19 LAB — CBC
HCT: 32.5 % — ABNORMAL LOW (ref 36.0–46.0)
Hemoglobin: 10.5 g/dL — ABNORMAL LOW (ref 12.0–15.0)
MCH: 30.7 pg (ref 26.0–34.0)
MCHC: 32.3 g/dL (ref 30.0–36.0)
MCV: 95 fL (ref 80.0–100.0)
Platelets: 190 10*3/uL (ref 150–400)
RBC: 3.42 MIL/uL — ABNORMAL LOW (ref 3.87–5.11)
RDW: 14 % (ref 11.5–15.5)
WBC: 4.9 10*3/uL (ref 4.0–10.5)
nRBC: 0 % (ref 0.0–0.2)

## 2018-11-19 LAB — BASIC METABOLIC PANEL
Anion gap: 7 (ref 5–15)
BUN: 15 mg/dL (ref 8–23)
CO2: 22 mmol/L (ref 22–32)
Calcium: 8.6 mg/dL — ABNORMAL LOW (ref 8.9–10.3)
Chloride: 113 mmol/L — ABNORMAL HIGH (ref 98–111)
Creatinine, Ser: 0.96 mg/dL (ref 0.44–1.00)
GFR calc Af Amer: >60 mL/min (ref >60)
GFR calc non Af Amer: 57 mL/min — ABNORMAL LOW (ref >60)
Glucose, Bld: 96 mg/dL (ref 70–99)
Potassium: 3.8 mmol/L (ref 3.5–5.1)
Sodium: 142 mmol/L (ref 135–145)

## 2018-11-19 LAB — ECHOCARDIOGRAM COMPLETE
Height: 67 in
Weight: 2985.6 oz

## 2018-11-19 LAB — T4, FREE: Free T4: 1.16 ng/dL — ABNORMAL HIGH (ref 0.61–1.12)

## 2018-11-19 LAB — MAGNESIUM: Magnesium: 2 mg/dL (ref 1.7–2.4)

## 2018-11-19 MED ORDER — LEVOTHYROXINE SODIUM 50 MCG PO TABS: 50.0000 ug | ORAL_TABLET | Freq: Every day | ORAL | Status: DC

## 2018-11-19 MED ORDER — LEVOTHYROXINE SODIUM 50 MCG PO TABS: 50.0000 ug | ORAL_TABLET | Freq: Every day | ORAL | 0 refills | Status: DC

## 2018-11-19 NOTE — Evaluation (Signed)
Physical Therapy Evaluation and Discharge Patient Details Name: Tamara Larson MRN: [id number] DOB: [birth year] Today's Date: 11/19/2018   History of Present Illness  Pt is a 78 y/o female admitted after syncopal episode. Pt found to be in a fib with elevated HR. CT of head revealed L scalp hematoma at L occiptial lobe. PMH includes a fib, CAD s/p PCI, and HTN.   Clinical Impression  Patient evaluated by Physical Therapy with no further acute PT needs identified. All education has been completed and the patient has no further questions. Pt overall steady with gait and stair navigation. Pt functioning at a gross supervision level and reports she feels she is at her baseline. Pt asymptomatic throughout gait. Reports neighbor and family can check on her as needed. See below for any follow-up Physical Therapy or equipment needs. PT is signing off. Thank you for this referral. If needs change, please re-consult.      Follow Up Recommendations No PT follow up    Equipment Recommendations  None recommended by PT    Recommendations for Other Services       Precautions / Restrictions Precautions Precautions: Fall Restrictions Weight Bearing Restrictions: No      Mobility  Bed Mobility Overal bed mobility: Modified Independent                Transfers Overall transfer level: Needs assistance Equipment used: None Transfers: Sit to/from Stand Sit to Stand: Supervision         General transfer comment: Supervision for safety. Pt asymptomatic upon transition.   Ambulation/Gait Ambulation/Gait assistance: Supervision Gait Distance (Feet): 200 Feet Assistive device: None Gait Pattern/deviations: Step-through pattern;Decreased stride length Gait velocity: Decreased   General Gait Details: Slower gait, however, pt reports this is baseline. No overt LOB noted and pt asymptomatic throughout.   Stairs Stairs: Yes Stairs assistance: Supervision Stair Management: One rail  Right;Step to pattern;Forwards Number of Stairs: 1 General stair comments: Overall steady stair navigation. No LOB noted.   Wheelchair Mobility    Modified Rankin (Stroke Patients Only)       Balance Overall balance assessment: No apparent balance deficits (not formally assessed)                                           Pertinent Vitals/Pain Pain Assessment: No/denies pain    Home Living Family/patient expects to be discharged to:: Private residence Living Arrangements: Alone Available Help at Discharge: Family;Neighbor;Available PRN/intermittently Type of Home: House Home Access: Stairs to enter Entrance Stairs-Rails: Right Entrance Stairs-Number of Steps: 1 Home Layout: One level Home Equipment: None      Prior Function Level of Independence: Independent               Hand Dominance        Extremity/Trunk Assessment   Upper Extremity Assessment Upper Extremity Assessment: Overall WFL for tasks assessed    Lower Extremity Assessment Lower Extremity Assessment: Generalized weakness    Cervical / Trunk Assessment Cervical / Trunk Assessment: Normal  Communication   Communication: No difficulties  Cognition Arousal/Alertness: Awake/alert Behavior During Therapy: WFL for tasks assessed/performed Overall Cognitive Status: Within Functional Limits for tasks assessed  General Comments      Exercises     Assessment/Plan    PT Assessment Patent does not need any further PT services  PT Problem List         PT Treatment Interventions      PT Goals (Current goals can be found in the Care Plan section)  Acute Rehab PT Goals Patient Stated Goal: to figure out what is going on PT Goal Formulation: With patient Time For Goal Achievement: 11/19/18 Potential to Achieve Goals: Good    Frequency     Barriers to discharge        Co-evaluation               AM-PAC  PT "6 Clicks" Mobility  Outcome Measure Help needed turning from your back to your side while in a flat bed without using bedrails?: None Help needed moving from lying on your back to sitting on the side of a flat bed without using bedrails?: None Help needed moving to and from a bed to a chair (including a wheelchair)?: None Help needed standing up from a chair using your arms (e.g., wheelchair or bedside chair)?: None Help needed to walk in hospital room?: None Help needed climbing 3-5 steps with a railing? : None 6 Click Score: 24    End of Session Equipment Utilized During Treatment: Gait belt Activity Tolerance: Patient tolerated treatment well Patient left: in chair;with call bell/phone within reach Nurse Communication: Mobility status PT Visit Diagnosis: Other abnormalities of gait and mobility (R26.89)    Time: [id number] PT Time Calculation (min) (ACUTE ONLY): 15 min   Charges:   PT Evaluation $PT Eval Low Complexity: [place], PT, DPT  Acute Rehabilitation Services  Pager: [phone] Office: [phone]   Rudean Hitt 11/19/2018, 11:39 AM

## 2018-11-19 NOTE — Progress Notes (Signed)
  Echocardiogram 2D Echocardiogram has been performed.  Tamara Larson 11/19/2018, 3:26 PM
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
## 2018-11-19 NOTE — Care Management (Signed)
Spoke w patient over the phone and attempted to do [employer], however patient was about to get vascular test (? Carotids) and she could not talk during test.

## 2018-11-19 NOTE — Procedures (Addendum)
Patient Name: Tamara Larson  MRN: [id number]  Epilepsy Attending: Lora Havens  Referring Physician/Provider: Dr Antonieta Pert Date: 11/19/2018 Duration: 24.45 mins  Patient history: 78 year old female with syncope.  EEG to evaluate for seizures.  Level of alertness: Awake and drowsy  AEDs during EEG study: None  Technical aspects: This EEG study was done with scalp electrodes positioned according to the 10-20 International system of electrode placement. Electrical activity was acquired at a sampling rate of 500Hz  and reviewed with a high frequency filter of 70Hz  and a low frequency filter of 1Hz . EEG data were recorded continuously and digitally stored.   Description: The posterior dominant rhythm consists of 10-11 Hz activity of moderate voltage (25-35 uV) seen predominantly in posterior head regions, symmetric and reactive to eye opening and eye closing.  Drowsiness was characterized by attenuation of the posterior background rhythm.  Physiologic photic driving was seen during photic stimulation.  Hyperventilation was not performed.  IMPRESSION: This study is within normal limits. No seizures or epileptiform discharges were seen throughout the recording.  Nimrit Kehres Barbra Sarks

## 2018-11-19 NOTE — Progress Notes (Signed)
Pt  Heart rhythm converted to NSR. See EKG. HR 61. Patient stated she feels better.

## 2018-11-19 NOTE — Progress Notes (Signed)
Carotid artery duplex has been completed. Preliminary results can be found in CV Proc through chart review.   11/19/18 4:58 PM Carlos Levering RVT

## 2018-11-19 NOTE — Progress Notes (Signed)
EEG completed, results pending. 

## 2018-11-19 NOTE — Discharge Summary (Signed)
Physician Discharge Summary  Tamara Larson QAS:[id number] DOB: February 24, 1941 DOA: 11/18/2018  PCP: Lauree Chandler, NP  Admit date: 11/18/2018 Discharge date: 11/19/2018  Admitted From: home Disposition:  home  Recommendations for Outpatient Follow-up:  1. Follow up with PCP in 1-2 weeks and with cardiology clinic for holter device 2. Please follow up on the following pending results:  Home Health: no  Equipment/Devices:none  Discharge Condition: Stable CODE STATUS: FULL Diet recommendation: Heart Healthy  Brief/Interim Summary: Per H&P:78 y.o. female with medical history significant of hypertension, hyperlipidemia, paroxysmal atrial fibrillation on Eliquis,CAD s/p PCI in 09/24/2018 by Dr. Alvester Chou, and hypothyroidism; who presents after having a syncopal episode at home.History obtained from the patient and her sister who is present at bedside.  The patient's sister had come by her house to drop off a bag fresh okra.  The patient met her at the door and took the okra and her sister turned around to go back to her car.  When she looked back she saw the patient lying on the porch and she hit her head on the brick step.  Patient really remembers opening the door and grabbing the back of okra, but the next thing she recalls is being here in the hospital.  Yesterday, evening she recalls feeling her heart fluttering and wondering why this had occurred.  Otherwise, patient had been in her normal state of health since receiving the 2 stents at the end of July with Dr. Alvester Chou.  She also reports in the last month that her levothyroxine medication was decreased from 100 mcg down to 88 mcg.  She denies having any chest pain, shortness of breath, leg swelling, cough, shortness of breath, abdominal pain, nausea, vomiting, and diarrhea. in ER, was afebrile, but in atrial fibrillation with heart rates into the 130-140s.  Patient was given 10 mg of diltiazem with improvement and heart rates down to the low  100s. Labs significant for potassium 2.3 and calcium 7.7.Magnesium level is pending.  Scalp laceration did not require any stitches.  Patient had bee priorn given 593m of Normal saline, 40 mEq of potassium chloride p.o., potassium chloride 20 mEq IV, 2 g of magnesium sulfate IV. CT hEAD/c spine: showed " Scalp laceration/hematoma overlying the lower LEFT occipital bone and LEFT skull base. No underlying fracture. 2. No acute intracranial abnormality. No intracranial mass, hemorrhage or edema. No skull fracture. 3. No fracture or acute subluxation within the cervical spine. Mild degenerative change within the mid cervical spine, as detailed above. 4. Carotid atherosclerosis" Patient was admitted. Electrolytes were replaced-and hypokalemia hypomagnesemia resolved. Patient converted to normal sinus rhythm. This morning she is alert awake oriented x3 doing well.  No new complaints. Ambulating.  She has converted to normal sinus rhythm.   Work up completed w/ eeg, tte, carotids, seen by cardio and advised no further work up and d/c home and arrange for holter/event monitoring in clinic  Subjective: Resting well. Ambulating in room No chest pain sob, fever and feels well. At baseline, Lives alone, does not use care or walker, and independent at home, denies smoking or etoh use  Reports talking to her sister on the phone and got up to front porch to get pacakage from her, remembers talking to her. After she got package from her sister-and went inside- door was still open- but when sister looked back and found patient  was laying down the porch, head hanging on the  step. when she woke up she was in the hospital. Per  sister she was telling her to take her to house after the fall, but sister told her ems was on the way to take her to hospital but patient does not remember tha events. She remembers being on the hospital in ER.   Assessment & Plan:   Syncope and collapse: unclear etiology.  Differential  includes atrial fibrillation w RVR-but she did not have palpitation prior to the event, r/o other arrhythmia, non focal on exam and CT head no acute finding.  No seizure-like activity. Patient endorsed symptoms of chest fluttering 9/19 afternoon.TSH suppressed checking free T4 and cutting down on synthroid as it may have triggered RVR. CT head CT spine no acute fracture or intracranial abnormalities.  Nonfocal and no suspicion of stroke. overnight Normal sinus rhythm with A. fib and doing well.  Cardiology on consult-May benefit from a Holter monitor but seems she had one in august.  Ordered EEG for completeness and also getting carotid duplex both of them are normal, tte was with no acute findings- I called and discussed w Dr Alben Deeds and okay to discahrge home she will arrange for o/p holter/event monitoring in the clinic. Discussed with the patient and she verbalized undertstanding and agrees with the plan. Patient is instructed not to drive until cleared by neurology or cardiology.  Dyslipidemia, goal LDL below 70: Continue her statin  Hypothyroidism:TSH is suppressed , free t4 ordered. On synthroid Was on 125 mcg and not cut down to 100 then to 88 for past few months  Hypocalcemia/Hypokalemia/hypomagnesemia: Repleted and normalized.  Essential hypertension: Controlled, continue amdlodipine, metoprolol losartan  CAD S/P PCI: Recently had cardiac cath with PCI to proximal circumflex and LAD with DES on 7/27: Continue her Plavix, losartan, metoprolol and Lipitor.  Denies chest pain and troponin is less than 18.  PAF: Patient had cardiac event monitoring device October 04, 2018 that showed short run of PAF, normal sinus rhythm/sinus bradycardia/sinus tachycardia. She is already on Eliquis and metoprolol.  Scalp hematoma/laceration:Acute. laceration/hematoma did not require any stitches or staples. Cont routine wound care  GERD:Continue PPIs  DVT prophylaxis:Eliquis Code  Status:Full Family Communication:Discussed with the patient in detail. Discussed plan of care for dischaorge home Disposition Plan: d/c home  Body mass index is 29.23 kg/m.   Consultants: cardiology  Procedures: EEG: This study is within normal limits. No seizures or epileptiform discharges were seen throughout the recording.  TTE  1. Left ventricular ejection fraction, by visual estimation, is 60 to 65%. The left ventricle has normal function. Normal left ventricular size. There is no left ventricular hypertrophy.  2. Left ventricular diastolic Doppler parameters are consistent with pseudonormalization pattern of LV diastolic filling.  3. Global right ventricle has normal systolic function.The right ventricular size is normal. No increase in right ventricular wall thickness.  4. Left atrial size was moderately dilated.  5. Right atrial size was normal.  6. The mitral valve is normal in structure. Mild mitral valve regurgitation. No evidence of mitral stenosis.  7. The tricuspid valve is normal in structure. Tricuspid valve regurgitation is mild.  8. The aortic valve is normal in structure. Aortic valve regurgitation was not visualized by color flow Doppler. Mild aortic valve sclerosis without stenosis.  9. The pulmonic valve was normal in structure. Pulmonic valve regurgitation is not visualized by color flow Doppler. 10. Normal pulmonary artery systolic pressure. 11. The inferior vena cava is normal in size with greater than 50% respiratory variability, suggesting right atrial pressure of 3 mmHg.  Carotid duplex Right  Carotid: Velocities in the right ICA are consistent with a 1-39% stenosis.  Left Carotid: Velocities in the left ICA are consistent with a 1-39% stenosis.  Vertebrals: Bilateral vertebral arteries demonstrate antegrade flow.  Microbiology: none   Discharge Diagnoses:  Principal Problem:   Syncope and collapse Active Problems:   Dyslipidemia, goal LDL below  70   Hypothyroidism   Hypokalemia   Essential hypertension   CAD S/P PCI   Paroxysmal atrial fibrillation (HCC)   Chronic anticoagulation   Hypocalcemia    Discharge Instructions  Discharge Instructions    Diet - low sodium heart healthy   Complete by: As directed    Discharge instructions   Complete by: As directed    Please call call MD or return to ER for similar or worsening recurring problem that brought you to hospital or if any fever,nausea/vomiting,abdominal pain, uncontrolled pain, chest pain,  shortness of breath or any other alarming symptoms.  Please do not drive motor vehicle until cleared by cardiology  Please follow-up your doctor as instructed in a week time and call the office for appointment. Please follow-up with the cardiology clinic, call tomorrow so that office can arrange Holter monitoring device  Please avoid alcohol, smoking, or any other illicit substance and maintain healthy habits including taking your regular medications as prescribed.  You were cared for by a hospitalist during your hospital stay. If you have any questions about your discharge medications or the care you received while you were in the hospital after you are discharged, you can call the unit and ask to speak with the hospitalist on call if the hospitalist that took care of you is not available.  Once you are discharged, your primary care physician will handle any further medical issues. Please note that NO REFILLS for any discharge medications will be authorized once you are discharged, as it is imperative that you return to your primary care physician (or establish a relationship with a primary care physician if you do not have one) for your aftercare needs so that they can reassess your need for medications and monitor your lab values   Increase activity slowly   Complete by: As directed    Please do not drive motor vehicle until cleared by cardiology     Allergies as of 11/19/2018       Reactions   Shellfish Allergy Swelling      Medication List    TAKE these medications   acetaminophen 650 MG CR tablet Commonly known as: TYLENOL Take 650 mg by mouth every 8 (eight) hours as needed for pain.   amLODipine 10 MG tablet Commonly known as: NORVASC TAKE 1 TABLET BY MOUTH EVERY DAY   apixaban 5 MG Tabs tablet Commonly known as: ELIQUIS Take 1 tablet (5 mg total) by mouth 2 (two) times daily.   clopidogrel 75 MG tablet Commonly known as: PLAVIX Take 1 tablet (75 mg total) by mouth daily with breakfast.   famotidine 20 MG tablet Commonly known as: PEPCID Take 1 tablet (20 mg total) by mouth daily as needed for heartburn or indigestion.   levothyroxine 50 MCG tablet Commonly known as: SYNTHROID Take 1 tablet (50 mcg total) by mouth daily at 6 (six) AM. Start taking on: November 20, 2018 What changed:   medication strength  how much to take  when to take this   losartan 100 MG tablet Commonly known as: COZAAR TAKE 1 TABLET BY MOUTH EVERY DAY   metoprolol tartrate 25 MG tablet Commonly  known as: LOPRESSOR TAKE 1/2 TABLET BY MOUTH TWICE A DAY   multivitamin with minerals Tabs tablet Take 1 tablet by mouth daily. Centrum Silver   nitroGLYCERIN 0.4 MG SL tablet Commonly known as: NITROSTAT Place 1 tablet (0.4 mg total) under the tongue every 5 (five) minutes as needed for chest pain.   simvastatin 20 MG tablet Commonly known as: ZOCOR TAKE 1 TABLET BY MOUTH DAILY. TAKE ONE TABLET BY MOUTH ON TUESDAY, THURSDAY AND SATURDAY What changed:   how much to take  how to take this  when to take this  additional instructions      Follow-up Information    Lauree Chandler, NP Follow up in 1 week(s).   Specialty: Geriatric Medicine Contact information: [place]. [place] [address] [phone]        Lorretta Harp, MD .   Specialties: Cardiology, Radiology Contact information: [address] [place]  [address] [phone]        Skeet Latch, MD. Call in 1 week(s).   Specialty: Cardiology Why: for holter device Contact information: [place] [address] [phone]          Allergies  Allergen Reactions  . Shellfish Allergy Swelling    Consultations:  cardiology  Procedures/Studies: Ct Head Wo Contrast  Result Date: 11/18/2018 CLINICAL DATA:  Head trauma. Syncopal episode with fall today. EXAM: CT HEAD WITHOUT CONTRAST CT CERVICAL SPINE WITHOUT CONTRAST TECHNIQUE: Multidetector CT imaging of the head and cervical spine was performed following the standard protocol without intravenous contrast. Multiplanar CT image reconstructions of the cervical spine were also generated. COMPARISON:  Head CT dated 12/22/2012. FINDINGS: CT HEAD FINDINGS Brain: Ventricles are stable. Mild chronic small vessel ischemic changes within the periventricular and subcortical white matter regions bilaterally. No mass, hemorrhage, edema or other evidence of acute parenchymal abnormality. No extra-axial hemorrhage. Vascular: No hyperdense vessel or unexpected calcification. Skull: Normal. Negative for fracture or focal lesion. Sinuses/Orbits: No acute finding. Other: Scalp laceration/hematoma overlying the lower LEFT occipital bone and LEFT skull base. No underlying fracture seen. CT CERVICAL SPINE FINDINGS Alignment: Mild scoliosis which may be accentuated by patient positioning. Slight reversal of the normal cervical spine lordosis which is likely related to patient positioning or muscle spasm. No evidence of acute vertebral body subluxation. Skull base and vertebrae: No fracture line or displaced fracture fragment seen. Facet joints are normally aligned. Soft tissues and spinal canal: No prevertebral fluid or swelling. No visible canal hematoma. Disc levels: Mild degenerative spondylosis within the mid cervical spine, but no more than mild central canal stenosis at any level.  Upper chest: Negative. Other: Bilateral carotid atherosclerosis. IMPRESSION: 1. Scalp laceration/hematoma overlying the lower LEFT occipital bone and LEFT skull base. No underlying fracture. 2. No acute intracranial abnormality. No intracranial mass, hemorrhage or edema. No skull fracture. 3. No fracture or acute subluxation within the cervical spine. Mild degenerative change within the mid cervical spine, as detailed above. 4. Carotid atherosclerosis. Electronically Signed   By: Franki Cabot M.D.   On: 11/18/2018 11:46   Ct Cervical Spine Wo Contrast  Result Date: 11/18/2018 CLINICAL DATA:  Head trauma. Syncopal episode with fall today. EXAM: CT HEAD WITHOUT CONTRAST CT CERVICAL SPINE WITHOUT CONTRAST TECHNIQUE: Multidetector CT imaging of the head and cervical spine was performed following the standard protocol without intravenous contrast. Multiplanar CT image reconstructions of the cervical spine were also generated. COMPARISON:  Head CT dated 12/22/2012. FINDINGS: CT HEAD FINDINGS  Brain: Ventricles are stable. Mild chronic small vessel ischemic changes within the periventricular and subcortical white matter regions bilaterally. No mass, hemorrhage, edema or other evidence of acute parenchymal abnormality. No extra-axial hemorrhage. Vascular: No hyperdense vessel or unexpected calcification. Skull: Normal. Negative for fracture or focal lesion. Sinuses/Orbits: No acute finding. Other: Scalp laceration/hematoma overlying the lower LEFT occipital bone and LEFT skull base. No underlying fracture seen. CT CERVICAL SPINE FINDINGS Alignment: Mild scoliosis which may be accentuated by patient positioning. Slight reversal of the normal cervical spine lordosis which is likely related to patient positioning or muscle spasm. No evidence of acute vertebral body subluxation. Skull base and vertebrae: No fracture line or displaced fracture fragment seen. Facet joints are normally aligned. Soft tissues and spinal canal:  No prevertebral fluid or swelling. No visible canal hematoma. Disc levels: Mild degenerative spondylosis within the mid cervical spine, but no more than mild central canal stenosis at any level. Upper chest: Negative. Other: Bilateral carotid atherosclerosis. IMPRESSION: 1. Scalp laceration/hematoma overlying the lower LEFT occipital bone and LEFT skull base. No underlying fracture. 2. No acute intracranial abnormality. No intracranial mass, hemorrhage or edema. No skull fracture. 3. No fracture or acute subluxation within the cervical spine. Mild degenerative change within the mid cervical spine, as detailed above. 4. Carotid atherosclerosis. Electronically Signed   By: Franki Cabot M.D.   On: 11/18/2018 11:46   Vas US Carotid  Result Date: 11/19/2018 Carotid Arterial Duplex Study Indications:       Syncope. Risk Factors:      None. Comparison Study:  No prior studies. Performing Technologist: Oliver Hum RVT  Examination Guidelines: A complete evaluation includes B-mode imaging, spectral Doppler, color Doppler, and power Doppler as needed of all accessible portions of each vessel. Bilateral testing is considered an integral part of a complete examination. Limited examinations for reoccurring indications may be performed as noted.  Right Carotid Findings: +----------+--------+--------+--------+-----------------------+--------+           PSV cm/sEDV cm/sStenosisPlaque Description     Comments +----------+--------+--------+--------+-----------------------+--------+ CCA Prox  57      13              smooth and heterogenoustortuous +----------+--------+--------+--------+-----------------------+--------+ CCA Distal60      15              smooth and heterogenous         +----------+--------+--------+--------+-----------------------+--------+ ICA Prox  38      10              smooth and heterogenous         +----------+--------+--------+--------+-----------------------+--------+ ICA  Distal57      18                                     tortuous +----------+--------+--------+--------+-----------------------+--------+ ECA       71      9                                               +----------+--------+--------+--------+-----------------------+--------+ +----------+--------+-------+--------+-------------------+           PSV cm/sEDV cmsDescribeArm Pressure (mmHG) +----------+--------+-------+--------+-------------------+ Subclavian122                                        +----------+--------+-------+--------+-------------------+ +---------+--------+--+--------+-+---------+  VertebralPSV cm/s49EDV cm/s9Antegrade +---------+--------+--+--------+-+---------+  Left Carotid Findings: +----------+--------+--------+--------+-----------------------+--------+           PSV cm/sEDV cm/sStenosisPlaque Description     Comments +----------+--------+--------+--------+-----------------------+--------+ CCA Prox  75      17              smooth and heterogenous         +----------+--------+--------+--------+-----------------------+--------+ CCA Distal60      17              smooth and heterogenous         +----------+--------+--------+--------+-----------------------+--------+ ICA Prox  70      23              smooth and heterogenous         +----------+--------+--------+--------+-----------------------+--------+ ICA Distal51      24                                              +----------+--------+--------+--------+-----------------------+--------+ ECA       68      9                                               +----------+--------+--------+--------+-----------------------+--------+ +----------+--------+--------+--------+-------------------+           PSV cm/sEDV cm/sDescribeArm Pressure (mmHG) +----------+--------+--------+--------+-------------------+ [id number]                                          +----------+--------+--------+--------+-------------------+ +---------+--------+--+--------+--+---------+ VertebralPSV cm/s47EDV cm/s18Antegrade +---------+--------+--+--------+--+---------+  Summary: Right Carotid: Velocities in the right ICA are consistent with a 1-39% stenosis. Left Carotid: Velocities in the left ICA are consistent with a 1-39% stenosis. Vertebrals: Bilateral vertebral arteries demonstrate antegrade flow. *See table(s) above for measurements and observations.     Preliminary    Discharge Exam: Vitals:   11/19/18 1131 11/19/18 1650  BP:  122/73  Pulse:  67  Resp:  19  Temp:  99.1 F (37.3 C)  SpO2: 100% 100%   Vitals:   11/19/18 0832 11/19/18 1116 11/19/18 1131 11/19/18 1650  BP: 123/72 131/79  122/73  Pulse: 66 70  67  Resp: 15   19  Temp: 98.5 F (36.9 C)   99.1 F (37.3 C)  TempSrc: Oral   Oral  SpO2: 98%  100% 100%  Weight:      Height:        General: Pt is alert, awake, not in acute distress Cardiovascular: RRR, S1/S2 +, no rubs, no gallops Respiratory: CTA bilaterally, no wheezing, no rhonchi Abdominal: Soft, NT, ND, bowel sounds + Extremities: no edema, no cyanosis   The results of significant diagnostics from this hospitalization (including imaging, microbiology, ancillary and laboratory) are listed below for reference.     Microbiology: Recent Results (from the past 240 hour(s))  SARS CORONAVIRUS 2 (TAT 6-24 HRS) Nasopharyngeal Nasopharyngeal Swab     Status: None   Collection Time: 11/18/18  1:17 PM   Specimen: Nasopharyngeal Swab  Result Value Ref Range Status   SARS Coronavirus 2 NEGATIVE NEGATIVE Final    Comment: (NOTE) SARS-CoV-2 target nucleic acids are NOT DETECTED. The SARS-CoV-2 RNA is generally detectable in upper and lower respiratory specimens  during the acute phase of infection. Negative results do not preclude SARS-CoV-2 infection, do not rule out co-infections with other pathogens, and should not be used as the sole  basis for treatment or other patient management decisions. Negative results must be combined with clinical observations, patient history, and epidemiological information. The expected result is Negative. Fact Sheet for Patients: SugarRoll.be Fact Sheet for Healthcare Providers: https://www.woods-mathews.com/ This test is not yet approved or cleared by the [place] FDA and  has been authorized for detection and/or diagnosis of SARS-CoV-2 by FDA under an Emergency Use Authorization (EUA). This EUA will remain  in effect (meaning this test can be used) for the duration of the COVID-19 declaration under Section 56 4(b)(1) of the Act, 21 U.S.C. section 360bbb-3(b)(1), unless the authorization is terminated or revoked sooner. Performed at [facility], [place] [address] [place], [place] [address]      Labs: BNP (last 3 results) No results for input(s): BNP in the last 8760 hours. Basic Metabolic Panel: Recent Labs  Lab 11/18/18 1038 11/19/18 0652  NA 144 142  K 2.3* 3.8  CL 114* 113*  CO2 21* 22  GLUCOSE 124* 96  BUN 12 15  CREATININE 0.86 0.96  CALCIUM 7.7* 8.6*  MG 1.5* 2.0   Liver Function Tests: Recent Labs  Lab 11/18/18 1038  AST 15  ALT 13  ALKPHOS 84  BILITOT 1.2  PROT 6.1*  ALBUMIN 3.2*   No results for input(s): LIPASE, AMYLASE in the last 168 hours. No results for input(s): AMMONIA in the last 168 hours. CBC: Recent Labs  Lab 11/18/18 1038 11/19/18 0652  WBC 6.8 4.9  NEUTROABS 2.6  --   HGB 12.9 10.5*  HCT 39.9 32.5*  MCV 95.5 95.0  PLT 229 190   Cardiac Enzymes: No results for input(s): CKTOTAL, CKMB, CKMBINDEX, TROPONINI in the last 168 hours. BNP: Invalid input(s): POCBNP CBG: Recent Labs  Lab 11/18/18 1025  GLUCAP 135*   D-Dimer No results for input(s): DDIMER in the last 72 hours. Hgb A1c No results for input(s): HGBA1C in the last 72 hours. Lipid Profile No results for input(s):  CHOL, HDL, LDLCALC, TRIG, CHOLHDL, LDLDIRECT in the last 72 hours. Thyroid function studies Recent Labs    11/18/18 1802  TSH 0.258*   Anemia work up No results for input(s): VITAMINB12, FOLATE, FERRITIN, TIBC, IRON, RETICCTPCT in the last 72 hours. Urinalysis    Component Value Date/Time   COLORURINE YELLOW 07/21/2017 0857   APPEARANCEUR CLOUDY (A) 07/21/2017 0857   LABSPEC 1.022 07/21/2017 0857   PHURINE 7.0 07/21/2017 0857   GLUCOSEU NEGATIVE 07/21/2017 0857   HGBUR NEGATIVE 07/21/2017 0857   BILIRUBINUR NEG 09/20/2016 1519   KETONESUR TRACE (A) 07/21/2017 0857   PROTEINUR 1+ (A) 07/21/2017 0857   UROBILINOGEN 1.0 09/20/2016 1519   UROBILINOGEN 0.2 11/14/2013 1438   NITRITE NEGATIVE 07/21/2017 0857   LEUKOCYTESUR 3+ (A) 07/21/2017 0857   Sepsis Labs Invalid input(s): PROCALCITONIN,  WBC,  LACTICIDVEN Microbiology Recent Results (from the past 240 hour(s))  SARS CORONAVIRUS 2 (TAT 6-24 HRS) Nasopharyngeal Nasopharyngeal Swab     Status: None   Collection Time: 11/18/18  1:17 PM   Specimen: Nasopharyngeal Swab  Result Value Ref Range Status   SARS Coronavirus 2 NEGATIVE NEGATIVE Final    Comment: (NOTE) SARS-CoV-2 target nucleic acids are NOT DETECTED. The SARS-CoV-2 RNA is generally detectable in upper and lower respiratory specimens during the acute phase of infection. Negative results do not preclude SARS-CoV-2 infection,  do not rule out co-infections with other pathogens, and should not be used as the sole basis for treatment or other patient management decisions. Negative results must be combined with clinical observations, patient history, and epidemiological information. The expected result is Negative. Fact Sheet for Patients: SugarRoll.be Fact Sheet for Healthcare Providers: https://www.woods-mathews.com/ This test is not yet approved or cleared by the [place] FDA and  has been authorized for detection and/or  diagnosis of SARS-CoV-2 by FDA under an Emergency Use Authorization (EUA). This EUA will remain  in effect (meaning this test can be used) for the duration of the COVID-19 declaration under Section 56 4(b)(1) of the Act, 21 U.S.C. section 360bbb-3(b)(1), unless the authorization is terminated or revoked sooner. Performed at [facility], [place] [address] [place], [place] [address]      Time coordinating discharge: 25 minutes  SIGNED:   Antonieta Pert, MD  [facility] 11/19/2018, 5:25 PM  If 7PM-7AM, please contact night-coverage [email or web address]

## 2018-11-20 ENCOUNTER — Telehealth: Payer: Self-pay | Admitting: *Deleted

## 2018-11-20 NOTE — Telephone Encounter (Signed)
Transition Care Management Follow-up Telephone Call  Date of discharge and from where: 11/19/2018 [facility]  How have you been since you were released from the hospital? Better, Resting  Any questions or concerns? No   Items Reviewed:  Did the pt receive and understand the discharge instructions provided? Yes   Medications obtained and verified? Yes   Any new allergies since your discharge? No   Dietary orders reviewed? Yes  Do you have support at home? Yes   Other (ie: DME, Home Health, etc) No  Functional Questionnaire: (I = Independent and D = Dependent) ADL's: I  Bathing/Dressing- I   Meal Prep- I  Eating- I  Maintaining continence- I  Transferring/Ambulation- I  Managing Meds- I   Follow up appointments reviewed:    PCP Hospital f/u appt confirmed? Yes  Scheduled to see Dinah on 11/21/18.  [facility] f/u appt confirmed? Yes  Scheduled to see Cardiologist .  Are transportation arrangements needed? No   If their condition worsens, is the pt aware to call  their PCP or go to the ED? Yes  Was the patient provided with contact information for the PCP's office or ED? Yes  Was the pt encouraged to call back with questions or concerns? Yes

## 2018-11-21 ENCOUNTER — Other Ambulatory Visit: Payer: Self-pay

## 2018-11-21 ENCOUNTER — Encounter: Payer: Self-pay | Admitting: Family

## 2018-11-21 ENCOUNTER — Ambulatory Visit (INDEPENDENT_AMBULATORY_CARE_PROVIDER_SITE_OTHER): Payer: Medicare HMO | Admitting: Family

## 2018-11-21 DIAGNOSIS — Z9861 Coronary angioplasty status: Secondary | ICD-10-CM

## 2018-11-21 DIAGNOSIS — E876 Hypokalemia: Secondary | ICD-10-CM

## 2018-11-21 DIAGNOSIS — I2510 Atherosclerotic heart disease of native coronary artery without angina pectoris: Secondary | ICD-10-CM

## 2018-11-21 DIAGNOSIS — E785 Hyperlipidemia, unspecified: Secondary | ICD-10-CM

## 2018-11-21 DIAGNOSIS — D649 Anemia, unspecified: Secondary | ICD-10-CM

## 2018-11-21 DIAGNOSIS — E039 Hypothyroidism, unspecified: Secondary | ICD-10-CM | POA: Diagnosis not present

## 2018-11-21 DIAGNOSIS — I480 Paroxysmal atrial fibrillation: Secondary | ICD-10-CM

## 2018-11-21 DIAGNOSIS — R55 Syncope and collapse: Secondary | ICD-10-CM
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
## 2018-11-23 ENCOUNTER — Encounter: Payer: Self-pay | Admitting: Cardiovascular Disease

## 2018-11-23 ENCOUNTER — Telehealth: Payer: Self-pay

## 2018-11-23 ENCOUNTER — Ambulatory Visit: Payer: Medicare HMO | Admitting: Cardiovascular Disease

## 2018-11-23 ENCOUNTER — Other Ambulatory Visit: Payer: Self-pay

## 2018-11-23 DIAGNOSIS — I2510 Atherosclerotic heart disease of native coronary artery without angina pectoris: Secondary | ICD-10-CM | POA: Diagnosis not present

## 2018-11-23 DIAGNOSIS — E079 Disorder of thyroid, unspecified: Secondary | ICD-10-CM | POA: Diagnosis not present

## 2018-11-23 DIAGNOSIS — E785 Hyperlipidemia, unspecified: Secondary | ICD-10-CM

## 2018-11-23 DIAGNOSIS — Z9861 Coronary angioplasty status: Secondary | ICD-10-CM

## 2018-11-23 DIAGNOSIS — I10 Essential (primary) hypertension: Secondary | ICD-10-CM

## 2018-11-23 DIAGNOSIS — I480 Paroxysmal atrial fibrillation: Secondary | ICD-10-CM

## 2018-11-23 DIAGNOSIS — R55 Syncope and collapse: Secondary | ICD-10-CM

## 2018-11-23 NOTE — Assessment & Plan Note (Signed)
Recent admission for syncope on 11/18/18 discharged the following day.  She was seen by Dr. Oval Linsey.  On admission she was found to be in A. fib with RVR and converted to sinus rhythm.  Carotid Dopplers were essentially normal as was a head CT.  It was thought that potentially her syncopal episodes related to her A. fib.  We will get a 30 event monitor to further evaluate.

## 2018-11-23 NOTE — Assessment & Plan Note (Signed)
History of CAD status post recent LAD and circumflex PCI and drug-eluting stenting by myself 09/24/2018 via the right radial approach.  She was on "triple therapy for a month and discontinue the aspirin after that.  She denies chest pain or shortness of breath.

## 2018-11-23 NOTE — Progress Notes (Signed)
11/23/2018 Tamara Larson   [birth year]  [id number]  Primary Physician Dewaine Oats Carlos American, NP Primary Cardiologist: Lorretta Harp MD Lupe Carney, [place]  HPI:  Tamara Larson is a 78 y.o.  moderately overweight widowed African-American female mother of one child, grandmother of 3 grandchildren referred by Dr. Amalia Hailey for peripheral vascular evaluation because of absent pedal pulses on exam.I last saw her in the office  10/23/2018. She has a history of treated hypertension and hyperlipidemia. She was apparently seen for ingrown toenails and apparently feel pulses were not able to be felt although she denies claudication. She had Dopplers that showed normal ABIs. He she does get some mild swelling in her legs on occasion which improved with compression stockings She was recently seen in the emergency room for atypical chest pain thought to be related to reflux and was treated with a antacid which has improved her symptoms.She also had a 2D echocardiogram performed 07/28/2017 which was essentially normal except for some calcified mitral leaflets without evidence of stenosis or regurgitation.  She was scheduled to have a coronary CTA but this could not be done because of her heart rate.  She therefore was set up to have an outpatient cath which I performed on 09/24/2018 via the right radial approach revealing high-grade proximal LAD and mid circumflex stenosis which I stented using drug-eluting stents.  She had a 50% mid RCA lesion which was treated medically.  She was on triple therapy for a month after which she discontinued aspirin.  Should be noted that she had an event monitor performed 09/06/2018 that did show PAF for which she was placed on Eliquis oral anticoagulation and low-dose plate beta-blockade.  She was admitted to the hospital 11/18/2018 for 1 day because of a witnessed syncopal episode at home.  On presentation to the hospital she was found to be in A. fib with RVR,  essentially converting to sinus rhythm.  Work-up in the hospitalization otherwise was unrevealing.  She denies chest pain or shortness of breath.  She was found to be hyperthyroid with a TSH measured at 0.258.  Her Synthroid dose was down titrated.  It is possible that this was contributing to her atrial fibrillation.   Current Meds  Medication Sig  . acetaminophen (TYLENOL) 650 MG CR tablet Take 650 mg by mouth every 8 (eight) hours as needed for pain.  Marland Kitchen amLODipine (NORVASC) 10 MG tablet TAKE 1 TABLET BY MOUTH EVERY DAY  . apixaban (ELIQUIS) 5 MG TABS tablet Take 1 tablet (5 mg total) by mouth 2 (two) times daily.  . clopidogrel (PLAVIX) 75 MG tablet Take 1 tablet (75 mg total) by mouth daily with breakfast.  . famotidine (PEPCID) 20 MG tablet Take 1 tablet (20 mg total) by mouth daily as needed for heartburn or indigestion.  Marland Kitchen levothyroxine (SYNTHROID) 50 MCG tablet Take 1 tablet (50 mcg total) by mouth daily at 6 (six) AM.  . losartan (COZAAR) 100 MG tablet TAKE 1 TABLET BY MOUTH EVERY DAY  . metoprolol tartrate (LOPRESSOR) 25 MG tablet TAKE 1/2 TABLET BY MOUTH TWICE A DAY  . Multiple Vitamin (MULTIVITAMIN WITH MINERALS) TABS tablet Take 1 tablet by mouth daily. Centrum Silver  . nitroGLYCERIN (NITROSTAT) 0.4 MG SL tablet Place 1 tablet (0.4 mg total) under the tongue every 5 (five) minutes as needed for chest pain.  . simvastatin (ZOCOR) 20 MG tablet Take 20 mg by mouth daily.     Allergies  Allergen Reactions  .  Shellfish Allergy Swelling    Social History   Socioeconomic History  . Marital status: Widowed    Spouse name: Not on file  . Number of children: Not on file  . Years of education: Not on file  . Highest education level: Not on file  Occupational History  . Not on file  Social Needs  . Financial resource strain: Not on file  . Food insecurity    Worry: Not on file    Inability: Not on file  . Transportation needs    Medical: Not on file    Non-medical: Not on  file  Tobacco Use  . Smoking status: Former Smoker    Packs/day: 0.00  . Smokeless tobacco: Never Used  . Tobacco comment: Quit about age 59   Substance and Sexual Activity  . Alcohol use: No    Alcohol/week: 0.0 standard drinks  . Drug use: No  . Sexual activity: Never    Birth control/protection: Post-menopausal, Surgical    Comment: 1st intercourse 78 yo-Fewer than 5 partners  Lifestyle  . Physical activity    Days per week: Not on file    Minutes per session: Not on file  . Stress: Not on file  Relationships  . Social Herbalist on phone: Not on file    Gets together: Not on file    Attends religious service: Not on file    Active member of club or organization: Not on file    Attends meetings of clubs or organizations: Not on file    Relationship status: Not on file  . Intimate partner violence    Fear of current or ex partner: Not on file    Emotionally abused: Not on file    Physically abused: Not on file    Forced sexual activity: Not on file  Other Topics Concern  . Not on file  Social History Narrative   Do you drink/eat things with caffeine? Yes, coffee   Was married x 26 years, widow   Lives in a one stories house, one person, no pets   Past/Current profession- Sales executive   Patient exercises daily      Review of Systems: General: negative for chills, fever, night sweats or weight changes.  Cardiovascular: negative for chest pain, dyspnea on exertion, edema, orthopnea, palpitations, paroxysmal nocturnal dyspnea or shortness of breath Dermatological: negative for rash Respiratory: negative for cough or wheezing Urologic: negative for hematuria Abdominal: negative for nausea, vomiting, diarrhea, bright red blood per rectum, melena, or hematemesis Neurologic: negative for visual changes, syncope, or dizziness All other systems reviewed and are otherwise negative except as noted above.    Blood pressure 140/80, pulse 76, height 5\' 7"   (1.702 m), weight 188 lb 12.8 oz (85.6 kg).  General appearance: alert and no distress Neck: no adenopathy, no carotid bruit, no JVD, supple, symmetrical, trachea midline and thyroid not enlarged, symmetric, no tenderness/mass/nodules Lungs: clear to auscultation bilaterally Heart: regular rate and rhythm, S1, S2 normal, no murmur, click, rub or gallop Extremities: extremities normal, atraumatic, no cyanosis or edema Pulses: 2+ and symmetric Skin: Skin color, texture, turgor normal. No rashes or lesions Neurologic: Alert and oriented X 3, normal strength and tone. Normal symmetric reflexes. Normal coordination and gait  EKG not performed today  ASSESSMENT AND PLAN:   Thyroid disease History of hypothyroidism on Synthroid replacement with recent blood work revealing a TSH of 0.258.  Her Synthroid dose was adjusted down.  Dyslipidemia, goal LDL below  70 History of dyslipidemia on statin therapy with lipid profile performed 08/02/2018 revealing total cholesterol 189, LDL 105 and HDL of 64.  Essential hypertension History of essential hypertension with blood pressure measured today 140/80.  She is on amlodipine, metoprolol and losartan.  Continue current meds at current dosing.  CAD S/P PCI History of CAD status post recent LAD and circumflex PCI and drug-eluting stenting by myself 09/24/2018 via the right radial approach.  She was on "triple therapy for a month and discontinue the aspirin after that.  She denies chest pain or shortness of breath.  Paroxysmal atrial fibrillation (HCC) History of PAF on Eliquis oral anticoagulation.  She was found to be in A. fib by monitor July 2020.  Eliquis was added post PCI.  She is recently seen in the hospital earlier this week after having a syncopal episode that was witnessed.  She was found to be in A. fib with RVR and converted to sinus rhythm.  She was also found to be hyperthyroid and her Synthroid dose was down titrated.  I am going to obtain a  30-day event monitor to further evaluate.  Syncope and collapse Recent admission for syncope on 11/18/18 discharged the following day.  She was seen by Dr. Oval Linsey.  On admission she was found to be in A. fib with RVR and converted to sinus rhythm.  Carotid Dopplers were essentially normal as was a head CT.  It was thought that potentially her syncopal episodes related to her A. fib.  We will get a 30 event monitor to further evaluate.      Lorretta Harp MD FACP,FACC,FAHA, [facility] 11/23/2018 12:48 PM

## 2018-11-23 NOTE — Assessment & Plan Note (Signed)
History of PAF on Eliquis oral anticoagulation.  She was found to be in A. fib by monitor July 2020.  Eliquis was added post PCI.  She is recently seen in the hospital earlier this week after having a syncopal episode that was witnessed.  She was found to be in A. fib with RVR and converted to sinus rhythm.  She was also found to be hyperthyroid and her Synthroid dose was down titrated.  I am going to obtain a 30-day event monitor to further evaluate.

## 2018-11-23 NOTE — Assessment & Plan Note (Signed)
History of dyslipidemia on statin therapy with lipid profile performed 08/02/2018 revealing total cholesterol 189, LDL 105 and HDL of 64.

## 2018-11-23 NOTE — Telephone Encounter (Signed)
Tamara Larson called and stated she had difficulty getting transportation to her appointments.  She had a TOC visit with Dinah on 11/21/18, and an appointment set up to get labs done in the [facility] office on 11/28/18.  She started she had an appointment today with Dr. Gwenlyn Found and asked if she could get labs done there.  I checked with Dinah and she stated if Dr. Kennon Holter office was able to do the labs there that would be fine.  Tamara Larson called back and notified of above.  She stated she would work on getting transportation to the [facility] office next week if Dr. Kennon Holter office was not able to get labs.

## 2018-11-23 NOTE — Assessment & Plan Note (Signed)
History of hypothyroidism on Synthroid replacement with recent blood work revealing a TSH of 0.258.  Her Synthroid dose was adjusted down.

## 2018-11-23 NOTE — Assessment & Plan Note (Signed)
History of essential hypertension with blood pressure measured today 140/80.  She is on amlodipine, metoprolol and losartan.  Continue current meds at current dosing.

## 2018-11-23 NOTE — Telephone Encounter (Signed)
Tamara Larson said you wanted her to have labs

## 2018-11-23 NOTE — Patient Instructions (Signed)
Medication Instructions:  Your physician recommends that you continue on your current medications as directed. Please refer to the Current Medication list given to you today.  If you need a refill on your cardiac medications before your next appointment, please call your pharmacy.   Lab work: Your physician recommends that you HAVE LAB WORK DONE TODAY: COMPLETE BLOOD COUNT, BASIC METABOLIC PANEL, LIPID AND LIVER PANELS  If you have labs (blood work) drawn today and your tests are completely normal, you will receive your results only by: Marland Kitchen MyChart Message (if you have MyChart) OR . A paper copy in the mail If you have any lab test that is abnormal or we need to change your treatment, we will call you to review the results.  Testing/Procedures: Your physician has recommended that you wear a 30-DAY event monitor. Event monitors are medical devices that record the heart's electrical activity. Doctors most often [place] these monitors to diagnose arrhythmias. Arrhythmias are problems with the speed or rhythm of the heartbeat. The monitor is a small, portable device. You can wear one while you do your normal daily activities. This is usually used to diagnose what is causing palpitations/syncope (passing out). THIS WILL BE MAILED TO YOUR OR PLACED ON YOU AT [facility]: [place], [place], [place] [address]   Follow-Up: At [facility], you and your health needs are our priority.  As part of our continuing mission to provide you with exceptional heart care, we have created designated Provider Care Teams.  These Care Teams include your primary Cardiologist (physician) and Advanced Practice Providers (APPs -  Physician Assistants and Nurse Practitioners) who all work together to provide you with the care you need, when you need it. You will need a follow up appointment in 3 months WITH AN APP AND IN 6 MONTHS with Dr. Quay Burow.  Please call our office 2 months in advance to  schedule each appointment.

## 2018-11-24 LAB — LIPID PANEL
Chol/HDL Ratio: 3 ratio (ref 0.0–4.4)
Cholesterol, Total: 170 mg/dL (ref 100–199)
HDL: 56 mg/dL (ref >39)
LDL Chol Calc (NIH): 90 mg/dL (ref 0–99)
Triglycerides: 135 mg/dL (ref 0–149)
VLDL Cholesterol Cal: 24 mg/dL (ref 5–40)

## 2018-11-24 LAB — CBC
Hematocrit: 33.1 % — ABNORMAL LOW (ref 34.0–46.6)
Hemoglobin: 11.2 g/dL (ref 11.1–15.9)
MCH: 31.1 pg (ref 26.6–33.0)
MCHC: 33.8 g/dL (ref 31.5–35.7)
MCV: 92 fL (ref 79–97)
Platelets: 211 10*3/uL (ref 150–450)
RBC: 3.6 x10E6/uL — ABNORMAL LOW (ref 3.77–5.28)
RDW: 12.8 % (ref 11.7–15.4)
WBC: 5.3 10*3/uL (ref 3.4–10.8)

## 2018-11-24 LAB — HEPATIC FUNCTION PANEL
ALT: 11 IU/L (ref 0–32)
AST: 11 IU/L (ref 0–40)
Albumin: 4.2 g/dL (ref 3.7–4.7)
Alkaline Phosphatase: 117 IU/L (ref 39–117)
Bilirubin Total: 0.5 mg/dL (ref 0.0–1.2)
Bilirubin, Direct: 0.17 mg/dL (ref 0.00–0.40)
Total Protein: 6.7 g/dL (ref 6.0–8.5)

## 2018-11-24 LAB — BASIC METABOLIC PANEL
BUN/Creatinine Ratio: 15 (ref 12–28)
BUN: 15 mg/dL (ref 8–27)
CO2: 23 mmol/L (ref 20–29)
Calcium: 9.2 mg/dL (ref 8.7–10.3)
Chloride: 106 mmol/L (ref 96–106)
Creatinine, Ser: 1.03 mg/dL — ABNORMAL HIGH (ref 0.57–1.00)
GFR calc Af Amer: 61 mL/min/{1.73_m2} (ref >59)
GFR calc non Af Amer: 53 mL/min/{1.73_m2} — ABNORMAL LOW (ref >59)
Glucose: 95 mg/dL (ref 65–99)
Potassium: 3.6 mmol/L (ref 3.5–5.2)
Sodium: 145 mmol/L — ABNORMAL HIGH (ref 134–144)

## 2018-11-27 NOTE — Telephone Encounter (Signed)
Patient notified that since she had labs drawn at Dr. Kennon Holter office last week she does not need to come in for labs 11/28/18. Lab appointment canceled.

## 2018-11-28 ENCOUNTER — Telehealth: Payer: Self-pay | Admitting: *Deleted

## 2018-11-28 ENCOUNTER — Telehealth: Payer: Self-pay

## 2018-11-28 ENCOUNTER — Other Ambulatory Visit: Payer: Medicare HMO

## 2018-11-28 DIAGNOSIS — E785 Hyperlipidemia, unspecified: Secondary | ICD-10-CM

## 2018-11-28 MED ORDER — SIMVASTATIN 40 MG PO TABS: 40.0000 mg | ORAL_TABLET | Freq: Every day | ORAL | 3 refills | Status: DC

## 2018-11-28 NOTE — Telephone Encounter (Signed)
Spoke with pt, aware of dr berry's recommendations. New script sent to the pharmacy and Lab orders mailed to the pt

## 2018-11-28 NOTE — Telephone Encounter (Signed)
-----   Message from Lorretta Harp, MD sent at 11/26/2018  8:52 AM EDT ----- Increase Simva from 20-40 and re check FLP 2-3 months. Other labs OK

## 2018-11-28 NOTE — Telephone Encounter (Signed)
Spoke to pt, went over brief instructions for 30 day [employer] Event Monitor. Verified address. Ordered monitor to be delivered pt her home.  She was concerned that her insurance would not cover this because she has already wore one this year. I gave her the number to [employer] and she will contact them to find out if it will be covered.

## 2018-12-03 ENCOUNTER — Other Ambulatory Visit: Payer: Self-pay

## 2018-12-05 ENCOUNTER — Ambulatory Visit (INDEPENDENT_AMBULATORY_CARE_PROVIDER_SITE_OTHER): Payer: Medicare HMO

## 2018-12-05 DIAGNOSIS — I480 Paroxysmal atrial fibrillation: Secondary | ICD-10-CM

## 2018-12-06 ENCOUNTER — Encounter: Payer: Self-pay | Admitting: Gynecology

## 2018-12-10 ENCOUNTER — Other Ambulatory Visit: Payer: Self-pay | Admitting: Nurse Practitioner

## 2018-12-25 ENCOUNTER — Other Ambulatory Visit: Payer: Self-pay | Admitting: Nurse Practitioner

## 2018-12-26 ENCOUNTER — Encounter: Payer: Self-pay | Admitting: Nurse Practitioner

## 2018-12-26 ENCOUNTER — Ambulatory Visit (INDEPENDENT_AMBULATORY_CARE_PROVIDER_SITE_OTHER): Payer: Medicare HMO | Admitting: Nurse Practitioner

## 2018-12-26 ENCOUNTER — Other Ambulatory Visit: Payer: Self-pay

## 2018-12-26 VITALS — BP 126/80 | HR 70 | Temp 97.1°F | Ht 67.0 in | Wt 193.0 lb

## 2018-12-26 DIAGNOSIS — R350 Frequency of micturition: Secondary | ICD-10-CM

## 2018-12-26 DIAGNOSIS — R8290 Unspecified abnormal findings in urine: Secondary | ICD-10-CM | POA: Diagnosis not present

## 2018-12-26 LAB — POCT URINALYSIS DIPSTICK
Bilirubin, UA: NEGATIVE
Glucose, UA: NEGATIVE
Ketones, UA: NEGATIVE
Nitrite, UA: NEGATIVE
Protein, UA: NEGATIVE
Spec Grav, UA: 1.01 (ref 1.010–1.025)
Urobilinogen, UA: 0.2 E.U./dL
pH, UA: 7 (ref 5.0–8.0)

## 2018-12-26 MED ORDER — SULFAMETHOXAZOLE-TRIMETHOPRIM 800-160 MG PO TABS: 1.0000 | ORAL_TABLET | Freq: Two times a day (BID) | ORAL | 0 refills | Status: DC
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
## 2018-12-26 NOTE — Patient Instructions (Addendum)
Increase fluid intake  Can use cranberry tablet.  Take all antibiotic as prescribed    Urinary Tract Infection, Adult A urinary tract infection (UTI) is an infection of any part of the urinary tract. The urinary tract includes:  The kidneys.  The ureters.  The bladder.  The urethra. These organs make, store, and get rid of pee (urine) in the body. What are the causes? This is caused by germs (bacteria) in your genital area. These germs grow and cause swelling (inflammation) of your urinary tract. What increases the risk? You are more likely to develop this condition if:  You have a small, thin tube (catheter) to drain pee.  You cannot control when you pee or poop (incontinence).  You are female, and: ? You use these methods to prevent pregnancy: ? A medicine that kills sperm (spermicide). ? A device that blocks sperm (diaphragm). ? You have low levels of a female hormone (estrogen). ? You are pregnant.  You have genes that add to your risk.  You are sexually active.  You take antibiotic medicines.  You have trouble peeing because of: ? A prostate that is bigger than normal, if you are female. ? A blockage in the part of your body that drains pee from the bladder (urethra). ? A kidney stone. ? A nerve condition that affects your bladder (neurogenic bladder). ? Not getting enough to drink. ? Not peeing often enough.  You have other conditions, such as: ? Diabetes. ? A weak disease-fighting system (immune system). ? Sickle cell disease. ? Gout. ? Injury of the spine. What are the signs or symptoms? Symptoms of this condition include:  Needing to pee right away (urgently).  Peeing often.  Peeing small amounts often.  Pain or burning when peeing.  Blood in the pee.  Pee that smells bad or not like normal.  Trouble peeing.  Pee that is cloudy.  Fluid coming from the vagina, if you are female.  Pain in the belly or lower back. Other symptoms  include:  Throwing up (vomiting).  No urge to eat.  Feeling mixed up (confused).  Being tired and grouchy (irritable).  A fever.  Watery poop (diarrhea). How is this treated? This condition may be treated with:  Antibiotic medicine.  Other medicines.  Drinking enough water. Follow these instructions at home:  Medicines  Take over-the-counter and prescription medicines only as told by your doctor.  If you were prescribed an antibiotic medicine, take it as told by your doctor. Do not stop taking it even if you start to feel better. General instructions  Make sure you: ? Pee until your bladder is empty. ? Do not hold pee for a long time. ? Empty your bladder after sex. ? Wipe from front to back after pooping if you are a female. Use each tissue one time when you wipe.  Drink enough fluid to keep your pee pale yellow.  Keep all follow-up visits as told by your doctor. This is important. Contact a doctor if:  You do not get better after 1-2 days.  Your symptoms go away and then come back. Get help right away if:  You have very bad back pain.  You have very bad pain in your lower belly.  You have a fever.  You are sick to your stomach (nauseous).  You are throwing up. Summary  A urinary tract infection (UTI) is an infection of any part of the urinary tract.  This condition is caused by germs in your  genital area.  There are many risk factors for a UTI. These include having a small, thin tube to drain pee and not being able to control when you pee or poop.  Treatment includes antibiotic medicines for germs.  Drink enough fluid to keep your pee pale yellow. This information is not intended to replace advice given to you by your health care provider. Make sure you discuss any questions you have with your health care provider. Document Released: 08/03/2007 Document Revised: 02/01/2018 Document Reviewed: 08/24/2017 Elsevier Patient Education  2020 [employer].

## 2018-12-27 LAB — URINE CULTURE
MICRO NUMBER:: 1039903
Result:: NO GROWTH
SPECIMEN QUALITY:: ADEQUATE

## 2019-01-08 ENCOUNTER — Other Ambulatory Visit: Payer: Self-pay | Admitting: Cardiology

## 2019-01-16 ENCOUNTER — Other Ambulatory Visit: Payer: Medicare HMO

## 2019-01-16 ENCOUNTER — Telehealth: Payer: Self-pay

## 2019-01-16 ENCOUNTER — Other Ambulatory Visit: Payer: Self-pay

## 2019-01-16 DIAGNOSIS — E039 Hypothyroidism, unspecified: Secondary | ICD-10-CM

## 2019-01-16 NOTE — Telephone Encounter (Signed)
-----   Message from Erlene Quan, [place] sent at 01/16/2019  9:32 AM EST ----- Virtual if doing Vinnie Level PA-C 01/16/2019 9:33 AM

## 2019-01-16 NOTE — Telephone Encounter (Signed)
Contacted patient to see if she interested in doing a virtual visit to prevent the spread of COVID. She stated she wanted to think about and will give the office a call back.

## 2019-01-17 LAB — TSH: TSH: 18.7 mIU/L — ABNORMAL HIGH (ref 0.40–4.50)

## 2019-02-01 ENCOUNTER — Telehealth (HOSPITAL_COMMUNITY): Payer: Self-pay

## 2019-02-01 ENCOUNTER — Other Ambulatory Visit: Payer: Self-pay

## 2019-02-01 ENCOUNTER — Telehealth: Payer: Self-pay

## 2019-02-01 ENCOUNTER — Encounter: Payer: Self-pay | Admitting: Cardiovascular Disease

## 2019-02-01 ENCOUNTER — Encounter: Payer: Self-pay | Admitting: Cardiology

## 2019-02-01 ENCOUNTER — Ambulatory Visit: Payer: Medicare HMO | Admitting: Cardiology

## 2019-02-01 VITALS — BP 118/72 | HR 79 | Temp 97.2°F | Ht 67.0 in | Wt 189.0 lb

## 2019-02-01 DIAGNOSIS — E876 Hypokalemia: Secondary | ICD-10-CM | POA: Diagnosis not present

## 2019-02-01 DIAGNOSIS — R0789 Other chest pain: Secondary | ICD-10-CM

## 2019-02-01 LAB — BASIC METABOLIC PANEL
BUN/Creatinine Ratio: 12 (ref 12–28)
BUN: 13 mg/dL (ref 8–27)
CO2: 21 mmol/L (ref 20–29)
Calcium: 9.3 mg/dL (ref 8.7–10.3)
Chloride: 107 mmol/L — ABNORMAL HIGH (ref 96–106)
Creatinine, Ser: 1.07 mg/dL — ABNORMAL HIGH (ref 0.57–1.00)
GFR calc Af Amer: 58 mL/min/{1.73_m2} — ABNORMAL LOW (ref >59)
GFR calc non Af Amer: 50 mL/min/{1.73_m2} — ABNORMAL LOW (ref >59)
Glucose: 101 mg/dL — ABNORMAL HIGH (ref 65–99)
Potassium: 3.8 mmol/L (ref 3.5–5.2)
Sodium: 144 mmol/L (ref 134–144)

## 2019-02-01 LAB — MAGNESIUM: Magnesium: 2.1 mg/dL (ref 1.6–2.3)

## 2019-02-01 MED ORDER — PANTOPRAZOLE SODIUM 40 MG PO TBEC: 40.0000 mg | DELAYED_RELEASE_TABLET | Freq: Every day | ORAL | 2 refills | Status: DC

## 2019-02-01 NOTE — Assessment & Plan Note (Addendum)
Check BMP, Mg++ today
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
## 2019-02-01 NOTE — Assessment & Plan Note (Signed)
Admitted Sept 2020- AF on admission with K+ of 2.3.

## 2019-02-01 NOTE — Assessment & Plan Note (Signed)
Documented on monitor July 2020-Eliquis added post PCI Recurrent PAF when admitted with syncope Sept 2020

## 2019-02-01 NOTE — Telephone Encounter (Signed)
Encounter complete. 

## 2019-02-01 NOTE — Assessment & Plan Note (Signed)
CHADS VASC= 4-on Eliquis and Plavix

## 2019-02-01 NOTE — Assessment & Plan Note (Signed)
Cath- mCFX PCI with DES 09/25/2018 after abnormal Myoview Residual 50% RCA

## 2019-02-01 NOTE — Assessment & Plan Note (Signed)
Check Lexiscan

## 2019-02-01 NOTE — Patient Instructions (Addendum)
Medication Instructions:  START Protonix 40mg  Take 1 tablet daily  *If you need a refill on your cardiac medications before your next appointment, please call your pharmacy*  Lab Work: Your physician recommends that you return for lab work in: TODAY-BMET, [employer] If you have labs (blood work) drawn today and your tests are completely normal, you will receive your results only by: Marland Kitchen MyChart Message (if you have MyChart) OR . A paper copy in the mail If you have any lab test that is abnormal or we need to change your treatment, we will call you to review the results.  Testing/Procedures: Your physician has requested that you have a lexiscan myoview. For further information please visit [email or web address]. Please follow instruction sheet, as given. NO MEDS TO HOLD   Follow-Up: At [facility], you and your health needs are our priority.  As part of our continuing mission to provide you with exceptional heart care, we have created designated Provider Care Teams.  These Care Teams include your primary Cardiologist (physician) and Advanced Practice Providers (APPs -  Physician Assistants and Nurse Practitioners) who all work together to provide you with the care you need, when you need it.  Your next appointment:   3-4 week(s)  The format for your next appointment:   Virtual Visit   Provider:   Kerin Ransom, PA-C  Other Instructions

## 2019-02-01 NOTE — Telephone Encounter (Signed)
PATIENT SEEN IN OFFICE TODAY. HAVING VIRTUAL VISIT FOR NEXT FOLLOW UP. EXPLAINED PROCESS AND RECEIVED VERBAL CONSENT.        Virtual Visit Pre-Appointment Phone Call  "(Name), I am calling you today to discuss your upcoming appointment. We are currently trying to limit exposure to the virus that causes COVID-19 by seeing patients at home rather than in the office."  1. "What is the BEST phone number to call the day of the visit?" - include this in appointment notes  2. "Do you have or have access to (through a family member/friend) a smartphone with video capability that we can use for your visit?" a. If yes - list this number in appt notes as "cell" (if different from BEST phone #) and list the appointment type as a VIDEO visit in appointment notes b. If no - list the appointment type as a PHONE visit in appointment notes  3. Confirm consent - "In the setting of the current Covid19 crisis, you are scheduled for a (phone or video) visit with your provider on (date) at (time).  Just as we do with many in-office visits, in order for you to participate in this visit, we must obtain consent.  If you'd like, I can send this to your mychart (if signed up) or email for you to review.  Otherwise, I can obtain your verbal consent now.  All virtual visits are billed to your insurance company just like a normal visit would be.  By agreeing to a virtual visit, we'd like you to understand that the technology does not allow for your provider to perform an examination, and thus may limit your provider's ability to fully assess your condition. If your provider identifies any concerns that need to be evaluated in person, we will make arrangements to do so.  Finally, though the technology is pretty good, we cannot assure that it will always work on either your or our end, and in the setting of a video visit, we may have to convert it to a phone-only visit.  In either situation, we cannot ensure that we have a secure  connection.  Are you willing to proceed?" STAFF: Did the patient verbally acknowledge consent to telehealth visit? Document YES/NO here: YES  4. Advise patient to be prepared - "Two hours prior to your appointment, go ahead and check your blood pressure, pulse, oxygen saturation, and your weight (if you have the equipment to check those) and write them all down. When your visit starts, your provider will ask you for this information. If you have an Apple Watch or Kardia device, please plan to have heart rate information ready on the day of your appointment. Please have a pen and paper handy nearby the day of the visit as well."  5. Give patient instructions for MyChart download to smartphone OR Doximity/Doxy.me as below if video visit (depending on what platform provider is using)  6. Inform patient they will receive a phone call 15 minutes prior to their appointment time (may be from unknown caller ID) so they should be prepared to answer    TELEPHONE CALL NOTE  [place] has been deemed a candidate for a follow-up tele-health visit to limit community exposure during the Covid-19 pandemic. I spoke with the patient via phone to ensure availability of phone/video source, confirm preferred email & phone number, and discuss instructions and expectations.  I reminded Tamara Larson to be prepared with any vital sign and/or heart rhythm information that could potentially  be obtained via home monitoring, at the time of her visit. I reminded Tamara Larson to expect a phone call prior to her visit.  Harold Hedge, CMA 02/01/2019 11:35 AM   INSTRUCTIONS FOR DOWNLOADING THE MYCHART APP TO SMARTPHONE  - The patient must first make sure to have activated MyChart and know their login information - If Apple, go to [employer] and type in MyChart in the search bar and download the app. If Android, ask patient to go to [employer] and type in Martin in the search bar and download the app.  The app is free but as with any other app downloads, their phone may require them to verify saved payment information or Apple/Android password.  - The patient will need to then log into the app with their MyChart username and password, and select [facility] as their healthcare provider to link the account. When it is time for your visit, go to the MyChart app, find appointments, and click Begin Video Visit. Be sure to Select Allow for your device to access the Microphone and Camera for your visit. You will then be connected, and your provider will be with you shortly.  **If they have any issues connecting, or need assistance please contact MyChart service desk [phone])**  **If using a computer, in order to ensure the best quality for their visit they will need to use either of the following Internet Browsers: [employer], or Google Chrome**  IF USING DOXIMITY or DOXY.ME - The patient will receive a link just prior to their visit by text.     FULL LENGTH CONSENT FOR TELE-HEALTH VISIT   I hereby voluntarily request, consent and authorize [facility] and its employed or contracted physicians, physician assistants, nurse practitioners or other licensed health care professionals (the Practitioner), to provide me with telemedicine health care services (the "Services") as deemed necessary by the treating Practitioner. I acknowledge and consent to receive the Services by the Practitioner via telemedicine. I understand that the telemedicine visit will involve communicating with the Practitioner through live audiovisual communication technology and the disclosure of certain medical information by electronic transmission. I acknowledge that I have been given the opportunity to request an in-person assessment or other available alternative prior to the telemedicine visit and am voluntarily participating in the telemedicine visit.  I understand that I have the right to withhold or  withdraw my consent to the use of telemedicine in the course of my care at any time, without affecting my right to future care or treatment, and that the Practitioner or I may terminate the telemedicine visit at any time. I understand that I have the right to inspect all information obtained and/or recorded in the course of the telemedicine visit and may receive copies of available information for a reasonable fee.  I understand that some of the potential risks of receiving the Services via telemedicine include:  Marland Kitchen Delay or interruption in medical evaluation due to technological equipment failure or disruption; . Information transmitted may not be sufficient (e.g. poor resolution of images) to allow for appropriate medical decision making by the Practitioner; and/or  . In rare instances, security protocols could fail, causing a breach of personal health information.  Furthermore, I acknowledge that it is my responsibility to provide information about my medical history, conditions and care that is complete and accurate to the best of my ability. I acknowledge that Practitioner's advice, recommendations, and/or decision may be based on factors not within their  control, such as incomplete or inaccurate data provided by me or distortions of diagnostic images or specimens that may result from electronic transmissions. I understand that the practice of medicine is not an exact science and that Practitioner makes no warranties or guarantees regarding treatment outcomes. I acknowledge that I will receive a copy of this consent concurrently upon execution via email to the email address I last provided but may also request a printed copy by calling the office of [facility].    I understand that my insurance will be billed for this visit.   I have read or had this consent read to me. . I understand the contents of this consent, which adequately explains the benefits and risks of the Services being provided via  telemedicine.  . I have been provided ample opportunity to ask questions regarding this consent and the Services and have had my questions answered to my satisfaction. . I give my informed consent for the services to be provided through the use of telemedicine in my medical care  By participating in this telemedicine visit I agree to the above.

## 2019-02-04 ENCOUNTER — Other Ambulatory Visit: Payer: Self-pay

## 2019-02-04 ENCOUNTER — Ambulatory Visit (INDEPENDENT_AMBULATORY_CARE_PROVIDER_SITE_OTHER): Payer: Medicare HMO | Admitting: Nurse Practitioner

## 2019-02-04 ENCOUNTER — Encounter: Payer: Self-pay | Admitting: Nurse Practitioner

## 2019-02-04 VITALS — BP 122/78 | HR 69 | Temp 96.9°F | Ht 67.0 in | Wt 187.2 lb

## 2019-02-04 DIAGNOSIS — I10 Essential (primary) hypertension: Secondary | ICD-10-CM | POA: Diagnosis not present

## 2019-02-04 DIAGNOSIS — I480 Paroxysmal atrial fibrillation: Secondary | ICD-10-CM | POA: Diagnosis not present

## 2019-02-04 DIAGNOSIS — Z23 Encounter for immunization: Secondary | ICD-10-CM | POA: Diagnosis not present

## 2019-02-04 DIAGNOSIS — I2510 Atherosclerotic heart disease of native coronary artery without angina pectoris: Secondary | ICD-10-CM | POA: Diagnosis not present

## 2019-02-04 DIAGNOSIS — N6320 Unspecified lump in the left breast, unspecified quadrant: Secondary | ICD-10-CM

## 2019-02-04 DIAGNOSIS — E876 Hypokalemia: Secondary | ICD-10-CM

## 2019-02-04 DIAGNOSIS — E785 Hyperlipidemia, unspecified: Secondary | ICD-10-CM

## 2019-02-04 DIAGNOSIS — K219 Gastro-esophageal reflux disease without esophagitis: Secondary | ICD-10-CM

## 2019-02-04 DIAGNOSIS — Z9861 Coronary angioplasty status: Secondary | ICD-10-CM

## 2019-02-04 DIAGNOSIS — E039 Hypothyroidism, unspecified: Secondary | ICD-10-CM

## 2019-02-04 MED ORDER — LEVOTHYROXINE SODIUM 75 MCG PO TABS: 75.0000 ug | ORAL_TABLET | Freq: Every day | ORAL | 2 refills | Status: DC

## 2019-02-04 NOTE — Patient Instructions (Signed)
Please call [facility] to follow up on your screening colonoscopy- appears you are due now.  Located in: [facility] Address: [address], [place], [place] [address]  Phone: [phone]   Follow up January 14th or a few days after for fasting blood work.
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
## 2019-02-05 ENCOUNTER — Other Ambulatory Visit: Payer: Self-pay | Admitting: Nurse Practitioner

## 2019-02-05 DIAGNOSIS — N6320 Unspecified lump in the left breast, unspecified quadrant: Secondary | ICD-10-CM

## 2019-02-06 ENCOUNTER — Other Ambulatory Visit: Payer: Self-pay

## 2019-02-06 ENCOUNTER — Ambulatory Visit (HOSPITAL_COMMUNITY)
Admission: RE | Admit: 2019-02-06 | Discharge: 2019-02-06 | Disposition: A | Discharge status: Home / Self Care | Payer: Medicare HMO | Source: Ambulatory Visit | Attending: Cardiology | Admitting: Cardiology

## 2019-02-06 DIAGNOSIS — R0789 Other chest pain: Secondary | ICD-10-CM | POA: Diagnosis not present

## 2019-02-06 LAB — MYOCARDIAL PERFUSION IMAGING
LV dias vol: 81 mL (ref 46–106)
LV sys vol: 24 mL
Peak HR: 109 {beats}/min
Rest HR: 78 {beats}/min
SDS: 1
SRS: 1
SSS: 2
TID: 1.07

## 2019-02-06 MED ORDER — TECHNETIUM TC 99M TETROFOSMIN IV KIT
32.6000 | PACK | Freq: Once | INTRAVENOUS | Status: AC | PRN
  Administered 2019-02-06: 32.6 via INTRAVENOUS
  Filled 2019-02-06: qty 33

## 2019-02-06 MED ORDER — TECHNETIUM TC 99M TETROFOSMIN IV KIT
10.5000 | PACK | Freq: Once | INTRAVENOUS | Status: AC | PRN
  Administered 2019-02-06: 10.5 via INTRAVENOUS
  Filled 2019-02-06: qty 11

## 2019-02-06 MED ORDER — AMINOPHYLLINE 25 MG/ML IV SOLN
75.0000 mg | Freq: Once | INTRAVENOUS | Status: AC
  Administered 2019-02-06: 75 mg via INTRAVENOUS

## 2019-02-06 MED ORDER — REGADENOSON 0.4 MG/5ML IV SOLN
0.4000 mg | Freq: Once | INTRAVENOUS | Status: AC
  Administered 2019-02-06: 0.4 mg via INTRAVENOUS

## 2019-02-15 ENCOUNTER — Other Ambulatory Visit: Payer: Self-pay

## 2019-02-15 ENCOUNTER — Telehealth: Payer: Self-pay

## 2019-02-15 ENCOUNTER — Ambulatory Visit
Admission: RE | Admit: 2019-02-15 | Discharge: 2019-02-15 | Disposition: A | Discharge status: Home / Self Care | Payer: Medicare HMO | Source: Ambulatory Visit | Attending: Nurse Practitioner | Admitting: Nurse Practitioner

## 2019-02-15 ENCOUNTER — Encounter: Payer: Self-pay | Admitting: Cardiology

## 2019-02-15 ENCOUNTER — Telehealth (INDEPENDENT_AMBULATORY_CARE_PROVIDER_SITE_OTHER): Payer: Medicare HMO | Admitting: Cardiology

## 2019-02-15 VITALS — BP 119/78 | Ht 67.0 in | Wt 183.0 lb

## 2019-02-15 DIAGNOSIS — Z7901 Long term (current) use of anticoagulants: Secondary | ICD-10-CM

## 2019-02-15 DIAGNOSIS — Z9861 Coronary angioplasty status: Secondary | ICD-10-CM

## 2019-02-15 DIAGNOSIS — I2510 Atherosclerotic heart disease of native coronary artery without angina pectoris: Secondary | ICD-10-CM | POA: Diagnosis not present

## 2019-02-15 DIAGNOSIS — E039 Hypothyroidism, unspecified: Secondary | ICD-10-CM

## 2019-02-15 DIAGNOSIS — E876 Hypokalemia: Secondary | ICD-10-CM

## 2019-02-15 DIAGNOSIS — I480 Paroxysmal atrial fibrillation: Secondary | ICD-10-CM

## 2019-02-15 DIAGNOSIS — R0789 Other chest pain: Secondary | ICD-10-CM | POA: Diagnosis not present

## 2019-02-15 DIAGNOSIS — N6320 Unspecified lump in the left breast, unspecified quadrant: Secondary | ICD-10-CM

## 2019-02-15 MED ORDER — METOPROLOL TARTRATE 25 MG PO TABS: 12.5000 mg | ORAL_TABLET | Freq: Two times a day (BID) | ORAL | 3 refills | Status: DC
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
## 2019-02-15 NOTE — Telephone Encounter (Signed)
Contacted patient to discuss AVS Instructions. Gave patient Luke's recommendations from today's virtual office visit. Informed patient that someone from the scheduling dept will be in contact with them to schedule their follow up appt. Patient voiced understanding and AVS mailed.    

## 2019-02-15 NOTE — Telephone Encounter (Signed)
Left message for patient to contact the office to discuss avs instructions.

## 2019-02-15 NOTE — Patient Instructions (Signed)
Medication Instructions:  You can take a extra 12.5mg  of Metoprolol once a day as needed for palpitations. *If you need a refill on your cardiac medications before your next appointment, please call your pharmacy*  Lab Work: None  If you have labs (blood work) drawn today and your tests are completely normal, you will receive your results only by: Marland Kitchen MyChart Message (if you have MyChart) OR . A paper copy in the mail If you have any lab test that is abnormal or we need to change your treatment, we will call you to review the results.  Testing/Procedures: None   Follow-Up: At [facility], you and your health needs are our priority.  As part of our continuing mission to provide you with exceptional heart care, we have created designated Provider Care Teams.  These Care Teams include your primary Cardiologist (physician) and Advanced Practice Providers (APPs -  Physician Assistants and Nurse Practitioners) who all work together to provide you with the care you need, when you need it.  Your next appointment:   6 months   The format for your next appointment:   In Person  Provider:   Quay Burow, MD  Other Instructions

## 2019-02-26 ENCOUNTER — Ambulatory Visit: Payer: Medicare HMO | Admitting: Family

## 2019-02-26 ENCOUNTER — Ambulatory Visit: Payer: Self-pay

## 2019-02-26 ENCOUNTER — Encounter: Payer: Self-pay | Admitting: Family

## 2019-02-26 ENCOUNTER — Ambulatory Visit: Payer: Medicare HMO | Admitting: Physician Assistant

## 2019-02-26 ENCOUNTER — Other Ambulatory Visit: Payer: Self-pay

## 2019-02-26 VITALS — Ht 67.0 in | Wt 183.0 lb

## 2019-02-26 DIAGNOSIS — M5417 Radiculopathy, lumbosacral region: Secondary | ICD-10-CM

## 2019-02-26 MED ORDER — PREDNISONE 50 MG PO TABS: ORAL_TABLET | ORAL | 0 refills | Status: DC
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
## 2019-03-04 ENCOUNTER — Other Ambulatory Visit: Payer: Self-pay | Admitting: Family

## 2019-03-04 ENCOUNTER — Telehealth: Payer: Self-pay | Admitting: Orthopaedic Surgery

## 2019-03-04 ENCOUNTER — Ambulatory Visit: Payer: Medicare HMO | Admitting: Cardiology

## 2019-03-04 NOTE — Telephone Encounter (Signed)
Pt was in the office on 02/26/19 for LBP given pred taper and pt would like a refill on this medication. Please advise.

## 2019-03-04 NOTE — Telephone Encounter (Signed)
Patient called. She was prescribed prednisone and is requesting more   Call back number: [phone]

## 2019-03-05 MED ORDER — PREDNISONE 10 MG PO TABS: 10.0000 mg | ORAL_TABLET | Freq: Every day | ORAL | 0 refills | Status: DC

## 2019-03-05 NOTE — Telephone Encounter (Signed)
Pt states that she would like to try one more round of prednisone and will call if she is ready to proceed with ESI

## 2019-03-05 NOTE — Telephone Encounter (Signed)
Sent rx for pred 10 mg  Can you offer esi, I discussed this with her last time  Will you ask if the pred was helpful

## 2019-03-05 NOTE — Addendum Note (Signed)
Addended by: Dondra Prader R on: 03/05/2019 09:02 AM   Modules accepted: Orders

## 2019-03-06 ENCOUNTER — Other Ambulatory Visit: Payer: Self-pay | Admitting: Nurse Practitioner

## 2019-03-18 ENCOUNTER — Other Ambulatory Visit: Payer: Medicare HMO

## 2019-03-18 ENCOUNTER — Other Ambulatory Visit: Payer: Self-pay

## 2019-03-18 DIAGNOSIS — E785 Hyperlipidemia, unspecified: Secondary | ICD-10-CM

## 2019-03-18 DIAGNOSIS — E039 Hypothyroidism, unspecified: Secondary | ICD-10-CM

## 2019-03-19 ENCOUNTER — Other Ambulatory Visit: Payer: Self-pay

## 2019-03-19 DIAGNOSIS — E785 Hyperlipidemia, unspecified: Secondary | ICD-10-CM

## 2019-03-19 DIAGNOSIS — E039 Hypothyroidism, unspecified: Secondary | ICD-10-CM

## 2019-03-19 DIAGNOSIS — E034 Atrophy of thyroid (acquired): Secondary | ICD-10-CM

## 2019-03-19 LAB — TSH: TSH: 7.08 mIU/L — ABNORMAL HIGH (ref 0.40–4.50)

## 2019-03-19 LAB — LIPID PANEL
Cholesterol: 229 mg/dL — ABNORMAL HIGH (ref <200)
HDL: 76 mg/dL (ref >=50)
LDL Cholesterol (Calc): 127 mg/dL (calc) — ABNORMAL HIGH
Non-HDL Cholesterol (Calc): 153 mg/dL (calc) — ABNORMAL HIGH (ref <130)
Total CHOL/HDL Ratio: 3 (calc) (ref <5.0)
Triglycerides: 150 mg/dL — ABNORMAL HIGH (ref <150)

## 2019-03-19 MED ORDER — ROSUVASTATIN CALCIUM 10 MG PO TABS: 10.0000 mg | ORAL_TABLET | Freq: Every day | ORAL | 1 refills | Status: DC

## 2019-03-19 NOTE — Telephone Encounter (Signed)
Janett Billow recommended patient stop simvastatin and start Crestor 10 mg. New order sent to pharmacy and med list updated.

## 2019-03-19 NOTE — Progress Notes (Signed)
Cholesterol is worse, recommend stopping zocor and starting crestor 10 mg daily to help get LDL to goal which is <70.  TSH has improved to 7.08 which is better (from 18.70)but still elevated lets do a slight increase to 88 mcg and have her follow up lipids, CMP and TSH in 8 weeks to follow

## 2019-03-21 ENCOUNTER — Other Ambulatory Visit: Payer: Self-pay | Admitting: Nurse Practitioner

## 2019-03-27 ENCOUNTER — Other Ambulatory Visit: Payer: Self-pay | Admitting: Nurse Practitioner

## 2019-03-27 DIAGNOSIS — E039 Hypothyroidism, unspecified: Secondary | ICD-10-CM

## 2019-04-10 ENCOUNTER — Other Ambulatory Visit: Payer: Self-pay | Admitting: Nurse Practitioner

## 2019-04-25 ENCOUNTER — Other Ambulatory Visit: Payer: Self-pay | Admitting: Cardiology

## 2019-04-30 ENCOUNTER — Other Ambulatory Visit: Payer: Self-pay | Admitting: Nurse Practitioner

## 2019-05-08 ENCOUNTER — Other Ambulatory Visit: Payer: Self-pay | Admitting: Nurse Practitioner

## 2019-05-21 ENCOUNTER — Other Ambulatory Visit: Payer: Self-pay

## 2019-05-21 ENCOUNTER — Other Ambulatory Visit: Payer: Medicare HMO

## 2019-05-21 DIAGNOSIS — E785 Hyperlipidemia, unspecified: Secondary | ICD-10-CM

## 2019-05-21 DIAGNOSIS — E034 Atrophy of thyroid (acquired): Secondary | ICD-10-CM

## 2019-05-22 ENCOUNTER — Other Ambulatory Visit: Payer: Self-pay

## 2019-05-22 DIAGNOSIS — E039 Hypothyroidism, unspecified: Secondary | ICD-10-CM

## 2019-05-22 LAB — COMPLETE METABOLIC PANEL WITH GFR
AG Ratio: 1.8 (calc) (ref 1.0–2.5)
ALT: 14 U/L (ref 6–29)
AST: 14 U/L (ref 10–35)
Albumin: 4.2 g/dL (ref 3.6–5.1)
Alkaline phosphatase (APISO): 79 U/L (ref 37–153)
BUN/Creatinine Ratio: 15 (calc) (ref 6–22)
BUN: 15 mg/dL (ref 7–25)
CO2: 24 mmol/L (ref 20–32)
Calcium: 9.6 mg/dL (ref 8.6–10.4)
Chloride: 107 mmol/L (ref 98–110)
Creat: 1.01 mg/dL — ABNORMAL HIGH (ref 0.60–0.93)
GFR, Est African American: 62 mL/min/{1.73_m2} (ref >=60)
GFR, Est Non African American: 54 mL/min/{1.73_m2} — ABNORMAL LOW (ref >=60)
Globulin: 2.4 g/dL (calc) (ref 1.9–3.7)
Glucose, Bld: 89 mg/dL (ref 65–99)
Potassium: 3.6 mmol/L (ref 3.5–5.3)
Sodium: 142 mmol/L (ref 135–146)
Total Bilirubin: 0.8 mg/dL (ref 0.2–1.2)
Total Protein: 6.6 g/dL (ref 6.1–8.1)

## 2019-05-22 LAB — LIPID PANEL
Cholesterol: 185 mg/dL (ref <200)
HDL: 72 mg/dL (ref >=50)
LDL Cholesterol (Calc): 92 mg/dL (calc)
Non-HDL Cholesterol (Calc): 113 mg/dL (calc) (ref <130)
Total CHOL/HDL Ratio: 2.6 (calc) (ref <5.0)
Triglycerides: 110 mg/dL (ref <150)

## 2019-05-22 LAB — TSH: TSH: 0.3 mIU/L — ABNORMAL LOW (ref 0.40–4.50)

## 2019-06-17 ENCOUNTER — Other Ambulatory Visit: Payer: Self-pay | Admitting: Family

## 2019-06-17 NOTE — Telephone Encounter (Signed)
Do you want to refill? 

## 2019-07-02 ENCOUNTER — Other Ambulatory Visit: Payer: Medicare HMO

## 2019-07-02 ENCOUNTER — Other Ambulatory Visit: Payer: Self-pay

## 2019-07-02 DIAGNOSIS — E039 Hypothyroidism, unspecified: Secondary | ICD-10-CM

## 2019-07-03 LAB — TSH: TSH: 5.3 mIU/L — ABNORMAL HIGH (ref 0.40–4.50)

## 2019-07-04 ENCOUNTER — Other Ambulatory Visit: Payer: Self-pay | Admitting: Cardiology

## 2019-07-11 ENCOUNTER — Other Ambulatory Visit: Payer: Self-pay | Admitting: Family

## 2019-07-11 DIAGNOSIS — M5417 Radiculopathy, lumbosacral region: Secondary | ICD-10-CM

## 2019-07-11 NOTE — Telephone Encounter (Signed)
This pt was last in 03/2019 and given prednisone at that time/  do you want to refill or discuss ESI?

## 2019-07-12 NOTE — Telephone Encounter (Signed)
LMOM for patient letting her know we will order an ESI for her

## 2019-07-12 NOTE — Telephone Encounter (Signed)
Lets offer esi, i'll order

## 2019-07-22 ENCOUNTER — Other Ambulatory Visit: Payer: Self-pay | Admitting: Nurse Practitioner

## 2019-08-05 ENCOUNTER — Ambulatory Visit (INDEPENDENT_AMBULATORY_CARE_PROVIDER_SITE_OTHER): Payer: Medicare HMO | Admitting: Nurse Practitioner

## 2019-08-05 ENCOUNTER — Other Ambulatory Visit: Payer: Self-pay

## 2019-08-05 ENCOUNTER — Encounter: Payer: Self-pay | Admitting: Nurse Practitioner

## 2019-08-05 VITALS — BP 112/68 | HR 67 | Temp 96.8°F | Ht 67.0 in | Wt 196.8 lb

## 2019-08-05 DIAGNOSIS — I10 Essential (primary) hypertension: Secondary | ICD-10-CM

## 2019-08-05 DIAGNOSIS — I480 Paroxysmal atrial fibrillation: Secondary | ICD-10-CM | POA: Diagnosis not present

## 2019-08-05 DIAGNOSIS — Z9861 Coronary angioplasty status: Secondary | ICD-10-CM

## 2019-08-05 DIAGNOSIS — E785 Hyperlipidemia, unspecified: Secondary | ICD-10-CM

## 2019-08-05 DIAGNOSIS — E034 Atrophy of thyroid (acquired): Secondary | ICD-10-CM | POA: Diagnosis not present

## 2019-08-05 DIAGNOSIS — Z1211 Encounter for screening for malignant neoplasm of colon: Secondary | ICD-10-CM

## 2019-08-05 DIAGNOSIS — Z1212 Encounter for screening for malignant neoplasm of rectum: Secondary | ICD-10-CM

## 2019-08-05 DIAGNOSIS — K219 Gastro-esophageal reflux disease without esophagitis: Secondary | ICD-10-CM

## 2019-08-05 DIAGNOSIS — I2510 Atherosclerotic heart disease of native coronary artery without angina pectoris: Secondary | ICD-10-CM

## 2019-08-05 DIAGNOSIS — Z7189 Other specified counseling: Secondary | ICD-10-CM

## 2019-08-05 LAB — CBC WITH DIFFERENTIAL/PLATELET
Absolute Monocytes: 540 cells/uL (ref 200–950)
Basophils Absolute: 74 cells/uL (ref 0–200)
Basophils Relative: 1 %
Eosinophils Absolute: 141 cells/uL (ref 15–500)
Eosinophils Relative: 1.9 %
HCT: 35.2 % (ref 35.0–45.0)
Hemoglobin: 11.6 g/dL — ABNORMAL LOW (ref 11.7–15.5)
Lymphs Abs: 2324 cells/uL (ref 850–3900)
MCH: 31.6 pg (ref 27.0–33.0)
MCHC: 33 g/dL (ref 32.0–36.0)
MCV: 95.9 fL (ref 80.0–100.0)
MPV: 10.8 fL (ref 7.5–12.5)
Monocytes Relative: 7.3 %
Neutro Abs: 4322 cells/uL (ref 1500–7800)
Neutrophils Relative %: 58.4 %
Platelets: 258 10*3/uL (ref 140–400)
RBC: 3.67 10*6/uL — ABNORMAL LOW (ref 3.80–5.10)
RDW: 12.9 % (ref 11.0–15.0)
Total Lymphocyte: 31.4 %
WBC: 7.4 10*3/uL (ref 3.8–10.8)

## 2019-08-05 LAB — COMPLETE METABOLIC PANEL WITH GFR
AG Ratio: 1.7 (calc) (ref 1.0–2.5)
ALT: 12 U/L (ref 6–29)
AST: 11 U/L (ref 10–35)
Albumin: 3.9 g/dL (ref 3.6–5.1)
Alkaline phosphatase (APISO): 66 U/L (ref 37–153)
BUN/Creatinine Ratio: 18 (calc) (ref 6–22)
BUN: 21 mg/dL (ref 7–25)
CO2: 27 mmol/L (ref 20–32)
Calcium: 9.2 mg/dL (ref 8.6–10.4)
Chloride: 106 mmol/L (ref 98–110)
Creat: 1.14 mg/dL — ABNORMAL HIGH (ref 0.60–0.93)
GFR, Est African American: 54 mL/min/{1.73_m2} — ABNORMAL LOW (ref >=60)
GFR, Est Non African American: 46 mL/min/{1.73_m2} — ABNORMAL LOW (ref >=60)
Globulin: 2.3 g/dL (calc) (ref 1.9–3.7)
Glucose, Bld: 83 mg/dL (ref 65–99)
Potassium: 3.8 mmol/L (ref 3.5–5.3)
Sodium: 142 mmol/L (ref 135–146)
Total Bilirubin: 0.7 mg/dL (ref 0.2–1.2)
Total Protein: 6.2 g/dL (ref 6.1–8.1)
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
## 2019-08-12 ENCOUNTER — Ambulatory Visit: Payer: Medicare HMO | Admitting: Physical Medicine and Rehabilitation

## 2019-08-12 ENCOUNTER — Other Ambulatory Visit: Payer: Self-pay

## 2019-08-12 ENCOUNTER — Encounter: Payer: Self-pay | Admitting: Physical Medicine and Rehabilitation

## 2019-08-12 ENCOUNTER — Ambulatory Visit: Payer: Self-pay

## 2019-08-12 VITALS — BP 139/90 | HR 63

## 2019-08-12 DIAGNOSIS — M5416 Radiculopathy, lumbar region: Secondary | ICD-10-CM

## 2019-08-12 MED ORDER — DEXAMETHASONE SODIUM PHOSPHATE 10 MG/ML IJ SOLN
15.0000 mg | Freq: Once | INTRAMUSCULAR | Status: AC
  Administered 2019-08-12: 15 mg

## 2019-08-12 NOTE — Progress Notes (Signed)
Pt states pain in the right buttocks and right bursa. Pt states pain started awhile ago and has gotten worse in the past few weeks. Prednisone helped with pain. Sitting for a long time and moving around with yard work makes pain worse.   .Numeric Pain Rating Scale and Functional Assessment Average Pain 8   In the last MONTH (on 0-10 scale) has pain interfered with the following?  1. General activity like being  able to carry out your everyday physical activities such as walking, climbing stairs, carrying groceries, or moving a chair?  Rating(9)   +Driver, +BT(eliquis and plavix, ok for inj), -Dye Allergies.

## 2019-08-13 NOTE — Progress Notes (Signed)
Tamara Larson - 79 y.o. female MRN [id number]  Date of birth: May 23, 1940  Office Visit Note: Visit Date: 08/12/2019 PCP: Lauree Chandler, NP Referred by: Lauree Chandler, NP  Subjective: Chief Complaint  Patient presents with  . Right Hip - Pain   HPI:  Tamara Larson is a 79 y.o. female who comes in today At the request of Dondra Prader, [place] for planned Right L5-S1 lumbar epidural steroid injection with fluoroscopic guidance.  The patient has failed conservative care including home exercise, medications, time and activity modification.  This injection will be diagnostic and hopefully therapeutic.  Please see requesting physician notes for further details and justification.  Patient having chronic low back pain and right buttock pain some referral to the lateral trochanteric area definitely down the leg.  Sometimes groin pain.  She has a history of lumbar laminectomy by Dr. Rennis Harding.  She has not had any recent follow-up with him.  MRI shows multilevel degenerative change with facet arthropathy severe at L3-4 L4-L5-S1 without severe canal stenosis.  She does have foraminal stenosis at L4-5 bilaterally as well and subarticular recess or lateral recess stenosis at L5-S1.  She is on 2 anticoagulants so we did elect to do a transforaminal approach.   ROS Otherwise per HPI.  Assessment & Plan: Visit Diagnoses:  1. Lumbar radiculopathy     Plan: No additional findings.   Meds & Orders:  Meds ordered this encounter  Medications  . dexamethasone (DECADRON) injection 15 mg    Orders Placed This Encounter  Procedures  . XR C-ARM NO REPORT  . Epidural Steroid injection    Follow-up: Return if symptoms worsen or fail to improve.   Procedures: No procedures performed  Lumbosacral Transforaminal Epidural Steroid Injection - Sub-Pedicular Approach with Fluoroscopic Guidance  Patient: Tamara Larson      Date of Birth: 07/06/1940 MRN: [id number] PCP: Lauree Chandler,  NP      Visit Date: 08/12/2019   Universal Protocol:    Date/Time: 08/12/2019  Consent Given By: the patient  Position: PRONE  Additional Comments: Vital signs were monitored before and after the procedure. Patient was prepped and draped in the usual sterile fashion. The correct patient, procedure, and site was verified.   Injection Procedure Details:  Procedure Site One Meds Administered:  Meds ordered this encounter  Medications  . dexamethasone (DECADRON) injection 15 mg    Laterality: Right  Location/Site:  L5-S1  Needle size: 22 G  Needle type: Spinal  Needle Placement: Transforaminal  Findings:    -Comments: Excellent flow of contrast along the nerve and into the epidural space.  Procedure Details: After squaring off the end-plates to get a true AP view, the C-arm was positioned so that an oblique view of the foramen as noted above was visualized. The target area is just inferior to the "nose of the scotty dog" or sub pedicular. The soft tissues overlying this structure were infiltrated with 2-3 ml. of 1% Lidocaine without Epinephrine.  The spinal needle was inserted toward the target using a "trajectory" view along the fluoroscope beam.  Under AP and lateral visualization, the needle was advanced so it did not puncture dura and was located close the 6 O'Clock position of the pedical in AP tracterory. Biplanar projections were used to confirm position. Aspiration was confirmed to be negative for CSF and/or blood. A 1-2 ml. volume of Isovue-250 was injected and flow of contrast was noted at each level. Radiographs were obtained for  documentation purposes.   After attaining the desired flow of contrast documented above, a 0.5 to 1.0 ml test dose of 0.25% Marcaine was injected into each respective transforaminal space.  The patient was observed for 90 seconds post injection.  After no sensory deficits were reported, and normal lower extremity motor function was noted,    the above injectate was administered so that equal amounts of the injectate were placed at each foramen (level) into the transforaminal epidural space.   Additional Comments:  The patient tolerated the procedure well Dressing: 2 x 2 sterile gauze and Band-Aid    Post-procedure details: Patient was observed during the procedure. Post-procedure instructions were reviewed.  Patient left the clinic in stable condition.      Clinical History: Lumbosacral radiculopathy. Right hip pain. Back surgery 2005  EXAM: MRI LUMBAR SPINE WITHOUT CONTRAST  TECHNIQUE: Multiplanar, multisequence MR imaging of the lumbar spine was performed. No intravenous contrast was administered.  COMPARISON:  Lumbar radiographs 08/23/2017. No prior MRI for comparison.  FINDINGS: Segmentation:  Normal  Alignment:  Mild scoliosis.  Normal sagittal alignment  Vertebrae:  Negative for fracture or mass  Conus medullaris and cauda equina: Conus extends to the L1-2 level. Conus and cauda equina appear normal.  Paraspinal and other soft tissues: Bilateral renal cysts. No retroperitoneal mass or adenopathy.  Disc levels:  T11-12: Marked right foraminal encroachment due to asymmetric facet hypertrophy and disc bulging  T12-L1: Marked right foraminal encroachment due to asymmetric disc degeneration and spurring on the right as well as bilateral facet hypertrophy.  L1-2: Moderate spinal stenosis. Marked disc degeneration and spondylosis. Moderate to severe facet degeneration bilaterally. Marked foraminal stenosis bilaterally.  L2-3: Mild spinal stenosis. Moderate disc degeneration and disc bulging, left greater than right. Moderate facet hypertrophy bilaterally. Marked left foraminal encroachment due to spurring  L3-4: Decompressive laminectomy. Spinal canal adequate in size. Moderate to advanced disc degeneration with diffuse endplate spurring. Moderate left foraminal  encroachment.  L4-5: Posterior decompression. Mild spinal stenosis. Marked disc degeneration and spondylosis. Disc bulging and diffuse endplate spurring. Moderate facet hypertrophy. Marked foraminal encroachment bilaterally.  L5-S1 mild disc degeneration. Severe facet hypertrophy bilaterally with moderate subarticular stenosis bilaterally.  IMPRESSION: Advanced degenerative change throughout the lumbar spine. Extensive disc degeneration and spondylosis. Extensive facet degeneration. Multilevel spinal and foraminal stenosis throughout the lumbar spine as described above.   Electronically Signed   By: Franchot Gallo M.D.   On: 01/04/2018 06:49     Objective:  VS:  HT:    WT:   BMI:     BP:139/90  HR:63bpm  TEMP: ( )  RESP:  Physical Exam Constitutional:      General: She is not in acute distress.    Appearance: Normal appearance. She is not ill-appearing.  HENT:     Head: Normocephalic and atraumatic.     Right Ear: External ear normal.     Left Ear: External ear normal.  Eyes:     Extraocular Movements: Extraocular movements intact.  Cardiovascular:     Rate and Rhythm: Normal rate.     Pulses: Normal pulses.  Musculoskeletal:     Right lower leg: No edema.     Left lower leg: No edema.     Comments: Patient has good distal strength with no pain over the greater trochanters.  No clonus or focal weakness.  Skin:    Findings: No erythema, lesion or rash.  Neurological:     General: No focal deficit present.  Mental Status: She is alert and oriented to person, place, and time.     Sensory: No sensory deficit.     Motor: No weakness or abnormal muscle tone.     Coordination: Coordination normal.  Psychiatric:        Mood and Affect: Mood normal.        Behavior: Behavior normal.      Imaging: XR C-ARM NO REPORT  Result Date: 08/12/2019 Please see Notes tab for imaging impression.

## 2019-08-13 NOTE — Procedures (Signed)
Lumbosacral Transforaminal Epidural Steroid Injection - Sub-Pedicular Approach with Fluoroscopic Guidance  Patient: Tamara Larson      Date of Birth: [birth year] MRN: [id number] PCP: Lauree Chandler, NP      Visit Date: 08/12/2019   Universal Protocol:    Date/Time: 08/12/2019  Consent Given By: the patient  Position: PRONE  Additional Comments: Vital signs were monitored before and after the procedure. Patient was prepped and draped in the usual sterile fashion. The correct patient, procedure, and site was verified.   Injection Procedure Details:  Procedure Site One Meds Administered:  Meds ordered this encounter  Medications  . dexamethasone (DECADRON) injection 15 mg    Laterality: Right  Location/Site:  L5-S1  Needle size: 22 G  Needle type: Spinal  Needle Placement: Transforaminal  Findings:    -Comments: Excellent flow of contrast along the nerve and into the epidural space.  Procedure Details: After squaring off the end-plates to get a true AP view, the C-arm was positioned so that an oblique view of the foramen as noted above was visualized. The target area is just inferior to the "nose of the scotty dog" or sub pedicular. The soft tissues overlying this structure were infiltrated with 2-3 ml. of 1% Lidocaine without Epinephrine.  The spinal needle was inserted toward the target using a "trajectory" view along the fluoroscope beam.  Under AP and lateral visualization, the needle was advanced so it did not puncture dura and was located close the 6 O'Clock position of the pedical in AP tracterory. Biplanar projections were used to confirm position. Aspiration was confirmed to be negative for CSF and/or blood. A 1-2 ml. volume of Isovue-250 was injected and flow of contrast was noted at each level. Radiographs were obtained for documentation purposes.   After attaining the desired flow of contrast documented above, a 0.5 to 1.0 ml test dose of 0.25% Marcaine  was injected into each respective transforaminal space.  The patient was observed for 90 seconds post injection.  After no sensory deficits were reported, and normal lower extremity motor function was noted,   the above injectate was administered so that equal amounts of the injectate were placed at each foramen (level) into the transforaminal epidural space.   Additional Comments:  The patient tolerated the procedure well Dressing: 2 x 2 sterile gauze and Band-Aid    Post-procedure details: Patient was observed during the procedure. Post-procedure instructions were reviewed.  Patient left the clinic in stable condition.

## 2019-08-16 ENCOUNTER — Other Ambulatory Visit: Payer: Self-pay

## 2019-08-16 ENCOUNTER — Encounter: Payer: Self-pay | Admitting: Cardiovascular Disease

## 2019-08-16 ENCOUNTER — Ambulatory Visit: Payer: Medicare HMO | Admitting: Cardiovascular Disease

## 2019-08-16 VITALS — BP 126/76 | HR 64 | Ht 67.0 in | Wt 197.6 lb

## 2019-08-16 DIAGNOSIS — E785 Hyperlipidemia, unspecified: Secondary | ICD-10-CM

## 2019-08-16 DIAGNOSIS — I480 Paroxysmal atrial fibrillation: Secondary | ICD-10-CM | POA: Diagnosis not present

## 2019-08-16 DIAGNOSIS — I10 Essential (primary) hypertension: Secondary | ICD-10-CM | POA: Diagnosis not present

## 2019-08-16 DIAGNOSIS — I2510 Atherosclerotic heart disease of native coronary artery without angina pectoris: Secondary | ICD-10-CM

## 2019-08-16 DIAGNOSIS — Z9861 Coronary angioplasty status: Secondary | ICD-10-CM

## 2019-08-16 NOTE — Progress Notes (Signed)
08/16/2019 Tamara Larson   [birth year]  [id number]  Primary Physician Dewaine Oats Carlos American, NP Primary Cardiologist: Lorretta Harp MD Lupe Carney, [place]  HPI:  Tamara Larson is a 79 y.o.    moderately overweight widowed African-American female mother of one child, grandmother of 3 grandchildren referred by Dr. Amalia Hailey for peripheral vascular evaluation because of absent pedal pulses on exam.I last saw her in the office  11/23/2018. She has a history of treated hypertension and hyperlipidemia. She was apparently seen for ingrown toenails and apparently feel pulses were not able to be felt although she denies claudication. She had Dopplers that showed normal ABIs. He she does get some mild swelling in her legs on occasion which improved with compression stockings She was recently seen in the emergency room for atypical chest pain thought to be related to reflux and was treated with a antacid which has improved her symptoms.She also had a 2D echocardiogram performed 07/28/2017 which was essentially normal except for some calcified mitral leaflets without evidence of stenosis or regurgitation.She was scheduled to have a coronary CTA but this could not be done because of her heart rate. She therefore was set up to have an outpatient cath which I performed on 09/24/2018 via the right radial approach revealing high-grade proximal LAD and mid circumflex stenosis which I stented using drug-eluting stents. She had a 50% mid RCA lesion which was treated medically. She was on triple therapy for a month after which she discontinued aspirin. Should be noted that she had an event monitor performed 09/06/2018 that did show PAF for which she was placed on Eliquis oral anticoagulation and low-dose plate beta-blockade.  She was admitted to the hospital 11/18/2018 for 1 day because of a witnessed syncopal episode at home.  On presentation to the hospital she was found to be in A. fib with RVR,  essentially converting to sinus rhythm.  Work-up in the hospitalization otherwise was unrevealing.  She denies chest pain or shortness of breath.  She was found to be hyperthyroid with a TSH measured at 0.258.  Her Synthroid dose was down titrated.  It is possible that this was contributing to her atrial fibrillation.  Since I saw her in the office 9 months ago she did see Kerin Ransom 02/15/2019 complaining of some postprandial chest pain.  A subsequent Myoview stress test performed was negative.  She said no recurrent symptoms.  She has gained about 14 pounds because of a steroid injection due to sciatica.   Current Meds  Medication Sig  . acetaminophen (TYLENOL) 650 MG CR tablet Take 650 mg by mouth every 8 (eight) hours as needed for pain.  Marland Kitchen amLODipine (NORVASC) 10 MG tablet TAKE 1 TABLET BY MOUTH EVERY DAY  . clopidogrel (PLAVIX) 75 MG tablet TAKE 1 TABLET BY MOUTH EVERY DAY WITH BREAKFAST  . ELIQUIS 5 MG TABS tablet TAKE 1 TABLET BY MOUTH TWICE A DAY  . levothyroxine (SYNTHROID) 75 MCG tablet TAKE 1 TABLET (75 MCG TOTAL) BY MOUTH DAILY BEFORE BREAKFAST.  Marland Kitchen losartan (COZAAR) 100 MG tablet TAKE 1 TABLET BY MOUTH EVERY DAY  . metoprolol tartrate (LOPRESSOR) 25 MG tablet Take 0.5 tablets (12.5 mg total) by mouth 2 (two) times daily. You can take a extra 12.5mg  once a day as needed for palpitations  . Multiple Vitamin (MULTIVITAMIN WITH MINERALS) TABS tablet Take 1 tablet by mouth daily. Centrum Silver  . nitroGLYCERIN (NITROSTAT) 0.4 MG SL tablet Place 1 tablet (0.4 mg total)  under the tongue every 5 (five) minutes as needed for chest pain.  . pantoprazole (PROTONIX) 40 MG tablet TAKE 1 TABLET BY MOUTH EVERY DAY  . rosuvastatin (CRESTOR) 10 MG tablet TAKE 1 TABLET BY MOUTH EVERY DAY     Allergies  Allergen Reactions  . Shellfish Allergy Swelling    Social History   Socioeconomic History  . Marital status: Widowed    Spouse name: Not on file  . Number of children: Not on file  . Years  of education: Not on file  . Highest education level: Not on file  Occupational History  . Not on file  Tobacco Use  . Smoking status: Former Smoker    Packs/day: 0.00    Types: Cigarettes  . Smokeless tobacco: Never Used  . Tobacco comment: Quit about age 39   Vaping Use  . Vaping Use: Never used  Substance and Sexual Activity  . Alcohol use: No    Alcohol/week: 0.0 standard drinks  . Drug use: No  . Sexual activity: Never    Birth control/protection: Post-menopausal, Surgical    Comment: 1st intercourse 79 yo-Fewer than 5 partners  Other Topics Concern  . Not on file  Social History Narrative   Do you drink/eat things with caffeine? Yes, coffee   Was married x 26 years, widow   Lives in a one stories house, one person, no pets   Past/Current profession- Sales executive   Patient exercises daily    Social Determinants of Health   Financial Resource Strain:   . Difficulty of Paying Living Expenses:   Food Insecurity:   . Worried About Charity fundraiser in the Last Year:   . Arboriculturist in the Last Year:   Transportation Needs:   . Film/video editor (Medical):   Marland Kitchen Lack of Transportation (Non-Medical):   Physical Activity:   . Days of Exercise per Week:   . Minutes of Exercise per Session:   Stress:   . Feeling of Stress :   Social Connections:   . Frequency of Communication with Friends and Family:   . Frequency of Social Gatherings with Friends and Family:   . Attends Religious Services:   . Active Member of Clubs or Organizations:   . Attends Archivist Meetings:   Marland Kitchen Marital Status:   Intimate Partner Violence:   . Fear of Current or Ex-Partner:   . Emotionally Abused:   Marland Kitchen Physically Abused:   . Sexually Abused:      Review of Systems: General: negative for chills, fever, night sweats or weight changes.  Cardiovascular: negative for chest pain, dyspnea on exertion, edema, orthopnea, palpitations, paroxysmal nocturnal dyspnea or  shortness of breath Dermatological: negative for rash Respiratory: negative for cough or wheezing Urologic: negative for hematuria Abdominal: negative for nausea, vomiting, diarrhea, bright red blood per rectum, melena, or hematemesis Neurologic: negative for visual changes, syncope, or dizziness All other systems reviewed and are otherwise negative except as noted above.    Blood pressure 126/76, pulse 64, height 5\' 7"  (1.702 m), weight 197 lb 9.6 oz (89.6 kg), SpO2 98 %.  General appearance: alert and no distress Neck: no adenopathy, no carotid bruit, no JVD, supple, symmetrical, trachea midline and thyroid not enlarged, symmetric, no tenderness/mass/nodules Lungs: clear to auscultation bilaterally Heart: regular rate and rhythm, S1, S2 normal, no murmur, click, rub or gallop Extremities: extremities normal, atraumatic, no cyanosis or edema Pulses: 2+ and symmetric Skin: Skin color, texture, turgor normal.  No rashes or lesions Neurologic: Alert and oriented X 3, normal strength and tone. Normal symmetric reflexes. Normal coordination and gait  EKG sinus rhythm at 64 with nonspecific ST and T wave changes.  I personally reviewed this EKG.  ASSESSMENT AND PLAN:   Dyslipidemia, goal LDL below 70 History of dyslipidemia previously on Crestor every other day now changed to daily with blood work performed by her PCP recently.  Lipid profile performed 05/21/2019 revealed total cholesterol 185, LDL of 92 and HDL 72, not at goal for secondary prevention.  Essential hypertension History of essential hypertension a blood pressure measured today 126/76.  She is on amlodipine losartan and metoprolol.  CAD S/P PCI History of CAD status post LAD and circumflex stenting by myself 09/24/2018.  She did have a 50% residual mid dominant RCA stenosis and normal LV function.  She was complaining some postprandial chest pain when she saw Kerin Ransom in December and a Myoview stress test done subsequent to  that was entirely normal.  Paroxysmal atrial fibrillation ([place]) History of PAF maintaining sinus rhythm on Eliquis oral anticoagulation.      Lorretta Harp MD FACP,FACC,FAHA, [facility] 08/16/2019 10:43 AM

## 2019-08-16 NOTE — Assessment & Plan Note (Signed)
History of CAD status post LAD and circumflex stenting by myself 09/24/2018.  She did have a 50% residual mid dominant RCA stenosis and normal LV function.  She was complaining some postprandial chest pain when she saw Kerin Ransom in December and a Myoview stress test done subsequent to that was entirely normal.

## 2019-08-16 NOTE — Assessment & Plan Note (Signed)
History of dyslipidemia previously on Crestor every other day now changed to daily with blood work performed by her PCP recently.  Lipid profile performed 05/21/2019 revealed total cholesterol 185, LDL of 92 and HDL 72, not at goal for secondary prevention.

## 2019-08-16 NOTE — Assessment & Plan Note (Signed)
History of essential hypertension a blood pressure measured today 126/76.  She is on amlodipine losartan and metoprolol.

## 2019-08-16 NOTE — Assessment & Plan Note (Signed)
History of PAF maintaining sinus rhythm on Eliquis oral anticoagulation. 

## 2019-08-16 NOTE — Patient Instructions (Addendum)
Medication Instructions:  Your Physician recommend you continue on your current medication as directed.    *If you need a refill on your cardiac medications before your next appointment, please call your pharmacy*   Lab Work: None   Testing/Procedures: None   Follow-Up: At [facility], you and your health needs are our priority.  As part of our continuing mission to provide you with exceptional heart care, we have created designated Provider Care Teams.  These Care Teams include your primary Cardiologist (physician) and Advanced Practice Providers (APPs -  Physician Assistants and Nurse Practitioners) who all work together to provide you with the care you need, when you need it.  We recommend signing up for the patient portal called "MyChart".  Sign up information is provided on this After Visit Summary.  MyChart is used to connect with patients for Virtual Visits (Telemedicine).  Patients are able to view lab/test results, encounter notes, upcoming appointments, etc.  Non-urgent messages can be sent to your provider as well.   To learn more about what you can do with MyChart, go to [email or web address].    Your next appointment:   1 year(s)  The format for your next appointment:   In Person  Provider:   Quay Burow, MD   Your physician recommends that you schedule a follow-up appointment in 6 months with Kerin Ransom, PA

## 2019-09-30 ENCOUNTER — Other Ambulatory Visit: Payer: Self-pay | Admitting: Nurse Practitioner

## 2019-09-30 DIAGNOSIS — Z1231 Encounter for screening mammogram for malignant neoplasm of breast: Secondary | ICD-10-CM

## 2019-10-18 ENCOUNTER — Other Ambulatory Visit: Payer: Self-pay | Admitting: Nurse Practitioner

## 2019-10-21 ENCOUNTER — Other Ambulatory Visit: Payer: Self-pay

## 2019-10-21 ENCOUNTER — Ambulatory Visit
Admission: RE | Admit: 2019-10-21 | Discharge: 2019-10-21 | Disposition: A | Discharge status: Home / Self Care | Payer: Medicare HMO | Source: Ambulatory Visit | Attending: Nurse Practitioner | Admitting: Nurse Practitioner

## 2019-10-21 ENCOUNTER — Other Ambulatory Visit: Payer: Self-pay | Admitting: Cardiology

## 2019-10-21 DIAGNOSIS — Z1231 Encounter for screening mammogram for malignant neoplasm of breast: Secondary | ICD-10-CM

## 2019-10-24 ENCOUNTER — Telehealth: Payer: Self-pay | Admitting: Physical Medicine and Rehabilitation

## 2019-10-24 NOTE — Telephone Encounter (Signed)
Patient had right L5 TF on 6/14. Ok to repeat if helped, same problem/side, and no new injury?

## 2019-10-24 NOTE — Telephone Encounter (Signed)
Patient called needing an appointment for another injection in her back. The number to contact patient is [phone] or [phone] Cell (Best number to call her)

## 2019-10-24 NOTE — Telephone Encounter (Signed)
Needs auth and scheduling for [phone]. Patient can continue blood thinners.

## 2019-10-24 NOTE — Telephone Encounter (Signed)
Auth# [id number] has been submitted but pt is needing clinic notes. PENDING

## 2019-10-24 NOTE — Telephone Encounter (Signed)
Ok, but may be scs trial candidate - on BT's

## 2019-10-25 ENCOUNTER — Telehealth: Payer: Self-pay | Admitting: Family

## 2019-10-25 ENCOUNTER — Other Ambulatory Visit: Payer: Self-pay | Admitting: Physician Assistant

## 2019-10-25 MED ORDER — PREDNISONE 10 MG PO TABS
10.0000 mg | ORAL_TABLET | Freq: Every day | ORAL | 1 refills | Status: DC
Start: 2019-10-25 — End: 2019-12-18

## 2019-10-25 NOTE — Telephone Encounter (Signed)
Pt would like to know if Junie Panning would call in some prednisone for her seeing as it was helping her before she got the injections with Dr.Newton?  [phone]

## 2019-10-25 NOTE — Telephone Encounter (Signed)
This pt had an ESI with FN 07/2019 and states that this was not as helpful as the prednisone she had been given prior and wants to know if she can have one refill of this medication. Please advise.

## 2019-10-25 NOTE — Telephone Encounter (Signed)
Called in 1 refill but she needs to make an appointment if this does not help. Cant be on this long term

## 2019-10-25 NOTE — Telephone Encounter (Signed)
I called pt to advise.

## 2019-10-30 ENCOUNTER — Telehealth (INDEPENDENT_AMBULATORY_CARE_PROVIDER_SITE_OTHER): Payer: Medicare HMO | Admitting: Family

## 2019-10-30 DIAGNOSIS — M5417 Radiculopathy, lumbosacral region: Secondary | ICD-10-CM

## 2019-10-30 NOTE — Progress Notes (Signed)
Virtual Visit via Telephone Note  I connected with Tamara Larson on 10/30/19 at  3:45 PM EDT by telephone and verified that I am speaking with the correct person using two identifiers.  Location: Patient: Tamara Larson Provider: Dondra Prader   I discussed the limitations, risks, security and privacy concerns of performing an evaluation and management service by telephone and the availability of in person appointments. I also discussed with the patient that there may be a patient responsible charge related to this service. The patient expressed understanding and agreed to proceed.   History of Present Illness: The patient is a 79 year old woman who presents today with return of her chronic low back pain with radiculopathy.  She was initially seen for this back in December of 2020.   Having right-sided low back pain and upper buttock pain this radiates down her anterior thigh and some into the calf today.  This is been gradually worsening over the last several weeks.  She has not had any new injuries she is denies any weakness no red flag symptoms  She initially got good relief with prednisone.  She also underwent ESI in June of this year with great relief.  She reports following the ESI she felt she could go out dancing.  Unfortunately she is having a difficult time with her ADLs due to the pain.  She was pleased that she was at least able to make it to her mailbox today  Is requesting repeat ESI  States she has been taking some leftover prednisone the last few days with modest relief of her symptoms  Assessment and Plan: Will refer to Dr. Ernestina Patches for repeat ESI  Follow Up Instructions: She will call or return to the office should she have any acute worsening.  Or does not get relief with her next ESI.   I discussed the assessment and treatment plan with the patient. The patient was provided an opportunity to ask questions and all were answered. The patient agreed with the plan and  demonstrated an understanding of the instructions.   The patient was advised to call back or seek an in-person evaluation if the symptoms worsen or if the condition fails to improve as anticipated.  I provided 15 minutes of non-face-to-face time during this encounter.   Suzan Slick, NP

## 2019-11-01 ENCOUNTER — Other Ambulatory Visit: Payer: Self-pay | Admitting: Nurse Practitioner

## 2019-11-01 ENCOUNTER — Telehealth: Payer: Self-pay

## 2019-11-01 DIAGNOSIS — M5417 Radiculopathy, lumbosacral region: Secondary | ICD-10-CM

## 2019-11-01 NOTE — Telephone Encounter (Signed)
Tried to call pt to advise that insurance denied her 2nd injection with [facility]. Pt Dr. Sharol Given to have physical therapy referral for 6 weeks and then can try again to get auth for injections I tried to call pt no answer I will hold message and try again.

## 2019-11-03 ENCOUNTER — Other Ambulatory Visit: Payer: Self-pay | Admitting: Nurse Practitioner

## 2019-11-07 NOTE — Telephone Encounter (Signed)
Please advise 

## 2019-11-07 NOTE — Telephone Encounter (Signed)
I had called pt to advise that Dr. Sharol Given received a letter from her insurance company denying the injection with [facility] and advised she needs 6 weeks of physical therapy first per his instruction. I called the pt to advise and she said that she already has an appt with [facility] and wants to verify that auth had been obtained for injection. Can you please call pt to advise?

## 2019-11-07 NOTE — Telephone Encounter (Signed)
Pt approve and sch.

## 2019-11-07 NOTE — Telephone Encounter (Signed)
Called pt inform her that her insurance approve Auth# [id number] and she still sch for 11/20/19

## 2019-11-20 ENCOUNTER — Encounter: Payer: Self-pay | Admitting: Physical Medicine and Rehabilitation

## 2019-11-20 ENCOUNTER — Ambulatory Visit: Payer: Self-pay

## 2019-11-20 ENCOUNTER — Ambulatory Visit (INDEPENDENT_AMBULATORY_CARE_PROVIDER_SITE_OTHER): Payer: Medicare HMO | Admitting: Physical Medicine and Rehabilitation

## 2019-11-20 ENCOUNTER — Ambulatory Visit: Payer: Medicare HMO | Admitting: Rehabilitative and Restorative Service Providers"

## 2019-11-20 ENCOUNTER — Other Ambulatory Visit: Payer: Self-pay

## 2019-11-20 VITALS — BP 142/86 | HR 71

## 2019-11-20 DIAGNOSIS — M5416 Radiculopathy, lumbar region: Secondary | ICD-10-CM

## 2019-11-20 DIAGNOSIS — M961 Postlaminectomy syndrome, not elsewhere classified: Secondary | ICD-10-CM

## 2019-11-20 MED ORDER — METHYLPREDNISOLONE ACETATE 80 MG/ML IJ SUSP
80.0000 mg | Freq: Once | INTRAMUSCULAR | Status: AC
  Administered 2019-11-20: 80 mg

## 2019-11-20 NOTE — Progress Notes (Signed)
Pt state lower back pain. Pt state sitting for a long time makes the pain worse. Pt state she get up and try to move around and striagth to ease the pain. Pt state she takes pain pill to feel better. Pt has hx of inj on 08/12/19 Pt state it was great until a month ago.  Numeric Pain Rating Scale and Functional Assessment Average Pain 8   In the last MONTH (on 0-10 scale) has pain interfered with the following?  1. General activity like being  able to carry out your everyday physical activities such as walking, climbing stairs, carrying groceries, or moving a chair?  Rating(6)   +Driver, +BT, -Dye Allergies.

## 2019-11-21 NOTE — Progress Notes (Signed)
Tamara Larson - 79 y.o. female MRN [id number]  Date of birth: January 07, 1941  Office Visit Note: Visit Date: 11/20/2019 PCP: Lauree Chandler, NP Referred by: Lauree Chandler, NP  Subjective: Chief Complaint  Patient presents with  . Lower Back - Pain   HPI:  Tamara Larson is a 79 y.o. female who comes in today for planned repeat Right L5-S1 Lumbar epidural steroid injection with fluoroscopic guidance.  The patient has failed conservative care including home exercise, medications, time and activity modification.  This injection will be diagnostic and hopefully therapeutic.  Please see requesting physician notes for further details and justification. Patient received more than 50% pain relief from prior injection.  She in fact reports great relief up until just a month or so ago without any new trauma.  Injections seem to last 3 months and doing well with increased mobility.  Referring: Dondra Prader, FNP  MRI reviewed with images and spine model.  MRI reviewed in the note below.  Prior injection did offer more than 50% relief as noted above and did give the patient more functional ability for activities of daily living and was also beneficial in that it did reduce her medication requirement.  Procedures are done as part of a comprehensive orthopedic and pain management program.    ROS Otherwise per HPI.  Assessment & Plan: Visit Diagnoses:  1. Lumbar radiculopathy   2. Post laminectomy syndrome     Plan: No additional findings.   Meds & Orders:  Meds ordered this encounter  Medications  . methylPREDNISolone acetate (DEPO-MEDROL) injection 80 mg    Orders Placed This Encounter  Procedures  . XR C-ARM NO REPORT  . Epidural Steroid injection    Follow-up: Return if symptoms worsen or fail to improve.   Procedures: No procedures performed  Lumbosacral Transforaminal Epidural Steroid Injection - Sub-Pedicular Approach with Fluoroscopic Guidance  Patient: Tamara Larson      Date of Birth: 05-10-1940 MRN: [id number] PCP: Lauree Chandler, NP      Visit Date: 11/20/2019   Universal Protocol:    Date/Time: 11/20/2019  Consent Given By: the patient  Position: PRONE  Additional Comments: Vital signs were monitored before and after the procedure. Patient was prepped and draped in the usual sterile fashion. The correct patient, procedure, and site was verified.   Injection Procedure Details:  Procedure Site One Meds Administered:  Meds ordered this encounter  Medications  . methylPREDNISolone acetate (DEPO-MEDROL) injection 80 mg    Laterality: Right  Location/Site:  L5-S1  Needle size: 22 G  Needle type: Spinal  Needle Placement: Transforaminal  Findings:    -Comments: Excellent flow of contrast along the nerve, nerve root and into the epidural space.  Procedure Details: After squaring off the end-plates to get a true AP view, the C-arm was positioned so that an oblique view of the foramen as noted above was visualized. The target area is just inferior to the "nose of the scotty dog" or sub pedicular. The soft tissues overlying this structure were infiltrated with 2-3 ml. of 1% Lidocaine without Epinephrine.  The spinal needle was inserted toward the target using a "trajectory" view along the fluoroscope beam.  Under AP and lateral visualization, the needle was advanced so it did not puncture dura and was located close the 6 O'Clock position of the pedical in AP tracterory. Biplanar projections were used to confirm position. Aspiration was confirmed to be negative for CSF and/or blood. A 1-2 ml.  volume of Isovue-250 was injected and flow of contrast was noted at each level. Radiographs were obtained for documentation purposes.   After attaining the desired flow of contrast documented above, a 0.5 to 1.0 ml test dose of 0.25% Marcaine was injected into each respective transforaminal space.  The patient was observed for 90 seconds post  injection.  After no sensory deficits were reported, and normal lower extremity motor function was noted,   the above injectate was administered so that equal amounts of the injectate were placed at each foramen (level) into the transforaminal epidural space.   Additional Comments:  The patient tolerated the procedure well Dressing: 2 x 2 sterile gauze and Band-Aid    Post-procedure details: Patient was observed during the procedure. Post-procedure instructions were reviewed.  Patient left the clinic in stable condition.      Clinical History: Lumbosacral radiculopathy. Right hip pain. Back surgery 2005  EXAM: MRI LUMBAR SPINE WITHOUT CONTRAST  TECHNIQUE: Multiplanar, multisequence MR imaging of the lumbar spine was performed. No intravenous contrast was administered.  COMPARISON:  Lumbar radiographs 08/23/2017. No prior MRI for comparison.  FINDINGS: Segmentation:  Normal  Alignment:  Mild scoliosis.  Normal sagittal alignment  Vertebrae:  Negative for fracture or mass  Conus medullaris and cauda equina: Conus extends to the L1-2 level. Conus and cauda equina appear normal.  Paraspinal and other soft tissues: Bilateral renal cysts. No retroperitoneal mass or adenopathy.  Disc levels:  T11-12: Marked right foraminal encroachment due to asymmetric facet hypertrophy and disc bulging  T12-L1: Marked right foraminal encroachment due to asymmetric disc degeneration and spurring on the right as well as bilateral facet hypertrophy.  L1-2: Moderate spinal stenosis. Marked disc degeneration and spondylosis. Moderate to severe facet degeneration bilaterally. Marked foraminal stenosis bilaterally.  L2-3: Mild spinal stenosis. Moderate disc degeneration and disc bulging, left greater than right. Moderate facet hypertrophy bilaterally. Marked left foraminal encroachment due to spurring  L3-4: Decompressive laminectomy. Spinal canal adequate in  size. Moderate to advanced disc degeneration with diffuse endplate spurring. Moderate left foraminal encroachment.  L4-5: Posterior decompression. Mild spinal stenosis. Marked disc degeneration and spondylosis. Disc bulging and diffuse endplate spurring. Moderate facet hypertrophy. Marked foraminal encroachment bilaterally.  L5-S1 mild disc degeneration. Severe facet hypertrophy bilaterally with moderate subarticular stenosis bilaterally.  IMPRESSION: Advanced degenerative change throughout the lumbar spine. Extensive disc degeneration and spondylosis. Extensive facet degeneration. Multilevel spinal and foraminal stenosis throughout the lumbar spine as described above.   Electronically Signed   By: Franchot Gallo M.D.   On: 01/04/2018 06:49     Objective:  VS:  HT:    WT:   BMI:     BP:(!) 142/86  HR:71bpm  TEMP: ( )  RESP:  Physical Exam Constitutional:      General: She is not in acute distress.    Appearance: Normal appearance. She is not ill-appearing.  HENT:     Head: Normocephalic and atraumatic.     Right Ear: External ear normal.     Left Ear: External ear normal.  Eyes:     Extraocular Movements: Extraocular movements intact.  Cardiovascular:     Rate and Rhythm: Normal rate.     Pulses: Normal pulses.  Musculoskeletal:     Right lower leg: No edema.     Left lower leg: No edema.     Comments: Patient has good distal strength with no pain over the greater trochanters.  No clonus or focal weakness.  Skin:    Findings: No  erythema, lesion or rash.  Neurological:     General: No focal deficit present.     Mental Status: She is alert and oriented to person, place, and time.     Sensory: No sensory deficit.     Motor: No weakness or abnormal muscle tone.     Coordination: Coordination normal.  Psychiatric:        Mood and Affect: Mood normal.        Behavior: Behavior normal.      Imaging: XR C-ARM NO REPORT  Result Date: 11/20/2019 Please  see Notes tab for imaging impression.

## 2019-11-21 NOTE — Procedures (Signed)
Lumbosacral Transforaminal Epidural Steroid Injection - Sub-Pedicular Approach with Fluoroscopic Guidance  Patient: Tamara Larson      Date of Birth: 1940/03/12 MRN: [id number] PCP: Lauree Chandler, NP      Visit Date: 11/20/2019   Universal Protocol:    Date/Time: 11/20/2019  Consent Given By: the patient  Position: PRONE  Additional Comments: Vital signs were monitored before and after the procedure. Patient was prepped and draped in the usual sterile fashion. The correct patient, procedure, and site was verified.   Injection Procedure Details:  Procedure Site One Meds Administered:  Meds ordered this encounter  Medications  . methylPREDNISolone acetate (DEPO-MEDROL) injection 80 mg    Laterality: Right  Location/Site:  L5-S1  Needle size: 22 G  Needle type: Spinal  Needle Placement: Transforaminal  Findings:    -Comments: Excellent flow of contrast along the nerve, nerve root and into the epidural space.  Procedure Details: After squaring off the end-plates to get a true AP view, the C-arm was positioned so that an oblique view of the foramen as noted above was visualized. The target area is just inferior to the "nose of the scotty dog" or sub pedicular. The soft tissues overlying this structure were infiltrated with 2-3 ml. of 1% Lidocaine without Epinephrine.  The spinal needle was inserted toward the target using a "trajectory" view along the fluoroscope beam.  Under AP and lateral visualization, the needle was advanced so it did not puncture dura and was located close the 6 O'Clock position of the pedical in AP tracterory. Biplanar projections were used to confirm position. Aspiration was confirmed to be negative for CSF and/or blood. A 1-2 ml. volume of Isovue-250 was injected and flow of contrast was noted at each level. Radiographs were obtained for documentation purposes.   After attaining the desired flow of contrast documented above, a 0.5 to 1.0 ml  test dose of 0.25% Marcaine was injected into each respective transforaminal space.  The patient was observed for 90 seconds post injection.  After no sensory deficits were reported, and normal lower extremity motor function was noted,   the above injectate was administered so that equal amounts of the injectate were placed at each foramen (level) into the transforaminal epidural space.   Additional Comments:  The patient tolerated the procedure well Dressing: 2 x 2 sterile gauze and Band-Aid    Post-procedure details: Patient was observed during the procedure. Post-procedure instructions were reviewed.  Patient left the clinic in stable condition.

## 2019-12-01 ENCOUNTER — Other Ambulatory Visit: Payer: Self-pay | Admitting: Nurse Practitioner

## 2019-12-01 DIAGNOSIS — E039 Hypothyroidism, unspecified: Secondary | ICD-10-CM

## 2019-12-17 ENCOUNTER — Other Ambulatory Visit: Payer: Self-pay | Admitting: Physician Assistant

## 2019-12-18 ENCOUNTER — Encounter: Payer: Self-pay | Admitting: Nurse Practitioner

## 2019-12-18 ENCOUNTER — Ambulatory Visit (INDEPENDENT_AMBULATORY_CARE_PROVIDER_SITE_OTHER): Payer: Medicare HMO | Admitting: Nurse Practitioner

## 2019-12-18 ENCOUNTER — Other Ambulatory Visit: Payer: Self-pay

## 2019-12-18 VITALS — BP 122/70 | HR 65 | Temp 96.4°F | Ht 67.0 in | Wt 198.0 lb

## 2019-12-18 DIAGNOSIS — D649 Anemia, unspecified: Secondary | ICD-10-CM

## 2019-12-18 DIAGNOSIS — M5416 Radiculopathy, lumbar region: Secondary | ICD-10-CM

## 2019-12-18 DIAGNOSIS — I2510 Atherosclerotic heart disease of native coronary artery without angina pectoris: Secondary | ICD-10-CM

## 2019-12-18 DIAGNOSIS — I10 Essential (primary) hypertension: Secondary | ICD-10-CM | POA: Diagnosis not present

## 2019-12-18 DIAGNOSIS — Z1159 Encounter for screening for other viral diseases: Secondary | ICD-10-CM

## 2019-12-18 DIAGNOSIS — E034 Atrophy of thyroid (acquired): Secondary | ICD-10-CM | POA: Diagnosis not present

## 2019-12-18 DIAGNOSIS — K219 Gastro-esophageal reflux disease without esophagitis: Secondary | ICD-10-CM

## 2019-12-18 DIAGNOSIS — E785 Hyperlipidemia, unspecified: Secondary | ICD-10-CM

## 2019-12-18 DIAGNOSIS — Z9861 Coronary angioplasty status: Secondary | ICD-10-CM

## 2019-12-18 DIAGNOSIS — I480 Paroxysmal atrial fibrillation: Secondary | ICD-10-CM

## 2019-12-18 MED ORDER — LIDOCAINE 5 % EX PTCH: 1.0000 | MEDICATED_PATCH | CUTANEOUS | 0 refills | Status: DC
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
## 2019-12-18 NOTE — Patient Instructions (Signed)
To try pepcid 20 mg (this is over the counter) twice daily as needed or routinely.   Can take with protonix or can attempt to stop protonix    Food Choices for Gastroesophageal Reflux Disease, Adult When you have gastroesophageal reflux disease (GERD), the foods you eat and your eating habits are very important. Choosing the right foods can help ease your discomfort. Think about working with a nutrition specialist (dietitian) to help you make good choices. What are tips for following this plan?  Meals  Choose healthy foods that are low in fat, such as fruits, vegetables, whole grains, low-fat dairy products, and lean meat, fish, and poultry.  Eat small meals often instead of 3 large meals a day. Eat your meals slowly, and in a place where you are relaxed. Avoid bending over or lying down until 2-3 hours after eating.  Avoid eating meals 2-3 hours before bed.  Avoid drinking a lot of liquid with meals.  Cook foods using methods other than frying. Bake, grill, or broil food instead.  Avoid or limit: ? Chocolate. ? Peppermint or spearmint. ? Alcohol. ? Pepper. ? Black and decaffeinated coffee. ? Black and decaffeinated tea. ? Bubbly (carbonated) soft drinks. ? Caffeinated energy drinks and soft drinks.  Limit high-fat foods such as: ? Fatty meat or fried foods. ? Whole milk, cream, butter, or ice cream. ? Nuts and nut butters. ? Pastries, donuts, and sweets made with butter or shortening.  Avoid foods that cause symptoms. These foods may be different for everyone. Common foods that cause symptoms include: ? Tomatoes. ? Oranges, lemons, and limes. ? Peppers. ? Spicy food. ? Onions and garlic. ? Vinegar. Lifestyle  Maintain a healthy weight. Ask your doctor what weight is healthy for you. If you need to lose weight, work with your doctor to do so safely.  Exercise for at least 30 minutes for 5 or more days each week, or as told by your doctor.  Wear loose-fitting  clothes.  Do not smoke. If you need help quitting, ask your doctor.  Sleep with the head of your bed higher than your feet. Use a wedge under the mattress or blocks under the bed frame to raise the head of the bed. Summary  When you have gastroesophageal reflux disease (GERD), food and lifestyle choices are very important in easing your symptoms.  Eat small meals often instead of 3 large meals a day. Eat your meals slowly, and in a place where you are relaxed.  Limit high-fat foods such as fatty meat or fried foods.  Avoid bending over or lying down until 2-3 hours after eating.  Avoid peppermint and spearmint, caffeine, alcohol, and chocolate. This information is not intended to replace advice given to you by your health care provider. Make sure you discuss any questions you have with your health care provider. Document Revised: 06/07/2018 Document Reviewed: 03/22/2016 Elsevier Patient Education  [place].

## 2019-12-19 ENCOUNTER — Telehealth: Payer: Self-pay | Admitting: *Deleted

## 2019-12-19 LAB — COMPLETE METABOLIC PANEL WITH GFR
AG Ratio: 1.8 (calc) (ref 1.0–2.5)
ALT: 14 U/L (ref 6–29)
AST: 12 U/L (ref 10–35)
Albumin: 4.2 g/dL (ref 3.6–5.1)
Alkaline phosphatase (APISO): 71 U/L (ref 37–153)
BUN/Creatinine Ratio: 15 (calc) (ref 6–22)
BUN: 18 mg/dL (ref 7–25)
CO2: 25 mmol/L (ref 20–32)
Calcium: 9.5 mg/dL (ref 8.6–10.4)
Chloride: 106 mmol/L (ref 98–110)
Creat: 1.17 mg/dL — ABNORMAL HIGH (ref 0.60–0.93)
GFR, Est African American: 52 mL/min/{1.73_m2} — ABNORMAL LOW (ref >=60)
GFR, Est Non African American: 45 mL/min/{1.73_m2} — ABNORMAL LOW (ref >=60)
Globulin: 2.4 g/dL (calc) (ref 1.9–3.7)
Glucose, Bld: 90 mg/dL (ref 65–99)
Potassium: 3.5 mmol/L (ref 3.5–5.3)
Sodium: 143 mmol/L (ref 135–146)
Total Bilirubin: 0.7 mg/dL (ref 0.2–1.2)
Total Protein: 6.6 g/dL (ref 6.1–8.1)

## 2019-12-19 LAB — CBC WITH DIFFERENTIAL/PLATELET
Absolute Monocytes: 649 cells/uL (ref 200–950)
Basophils Absolute: 66 cells/uL (ref 0–200)
Basophils Relative: 0.7 %
Eosinophils Absolute: 113 cells/uL (ref 15–500)
Eosinophils Relative: 1.2 %
HCT: 36.7 % (ref 35.0–45.0)
Hemoglobin: 12.1 g/dL (ref 11.7–15.5)
Lymphs Abs: 2604 cells/uL (ref 850–3900)
MCH: 31.4 pg (ref 27.0–33.0)
MCHC: 33 g/dL (ref 32.0–36.0)
MCV: 95.3 fL (ref 80.0–100.0)
MPV: 11 fL (ref 7.5–12.5)
Monocytes Relative: 6.9 %
Neutro Abs: 5969 cells/uL (ref 1500–7800)
Neutrophils Relative %: 63.5 %
Platelets: 248 10*3/uL (ref 140–400)
RBC: 3.85 10*6/uL (ref 3.80–5.10)
RDW: 12.8 % (ref 11.0–15.0)
Total Lymphocyte: 27.7 %
WBC: 9.4 10*3/uL (ref 3.8–10.8)

## 2019-12-19 LAB — HEPATITIS C ANTIBODY
Hepatitis C Ab: NONREACTIVE
SIGNAL TO CUT-OFF: 0 (ref <1.00)

## 2019-12-19 LAB — LIPID PANEL
Cholesterol: 203 mg/dL — ABNORMAL HIGH (ref <200)
HDL: 91 mg/dL (ref >=50)
LDL Cholesterol (Calc): 93 mg/dL (calc)
Non-HDL Cholesterol (Calc): 112 mg/dL (calc) (ref <130)
Total CHOL/HDL Ratio: 2.2 (calc) (ref <5.0)
Triglycerides: 95 mg/dL (ref <150)

## 2019-12-19 LAB — TSH: TSH: 3.99 mIU/L (ref 0.40–4.50)

## 2019-12-19 NOTE — Telephone Encounter (Addendum)
Received fax from [facility] stating that the Lidocaine Patch was DENIED. Did not meet the requirements for covering this medication. Placed Prior Authorization determination fax in [facility] folder to review.

## 2019-12-19 NOTE — Telephone Encounter (Signed)
Received Prior Authorization Request from pharmacy for Lidocaine patch.  Initiated Prior Auth through [facility].  Went into determination.  KEY [id number] PA Case #:[id number] Rx#: [id number]

## 2019-12-20 ENCOUNTER — Other Ambulatory Visit: Payer: Self-pay

## 2019-12-20 DIAGNOSIS — E785 Hyperlipidemia, unspecified: Secondary | ICD-10-CM

## 2019-12-20 MED ORDER — ROSUVASTATIN CALCIUM 20 MG PO TABS: 20.0000 mg | ORAL_TABLET | Freq: Every day | ORAL | 1 refills | Status: DC

## 2020-01-12 ENCOUNTER — Other Ambulatory Visit: Payer: Self-pay | Admitting: Nurse Practitioner

## 2020-01-27 ENCOUNTER — Other Ambulatory Visit: Payer: Self-pay | Admitting: Physician Assistant

## 2020-02-01 ENCOUNTER — Other Ambulatory Visit: Payer: Self-pay | Admitting: Cardiology

## 2020-02-12 ENCOUNTER — Ambulatory Visit: Payer: Medicare HMO | Admitting: Cardiology

## 2020-02-12 ENCOUNTER — Telehealth: Payer: Self-pay | Admitting: Cardiovascular Disease

## 2020-02-12 NOTE — Telephone Encounter (Signed)
OK to DC plavix and start baby asa.

## 2020-02-12 NOTE — Telephone Encounter (Signed)
Patient had cath/stent 09/24/2018  She wants to know if she is to continue plavix  eliquis - PAF  Will route to MD, covering nurse & primary nurse

## 2020-02-12 NOTE — Telephone Encounter (Signed)
Spoke with patient and reviewed med change made by MD - stop plavix, start ASA 81mg  daily  Advised to continue eliquis as prescribed - anticoagulant for PAF  She voiced understanding

## 2020-02-12 NOTE — Telephone Encounter (Signed)
Pt c/o medication issue:  1. Name of Medication: clopidogrel (PLAVIX) 75 MG tablet ELIQUIS 5 MG TABS tablet  2. How are you currently taking this medication (dosage and times per day)? As prescribed  3. Are you having a reaction (difficulty breathing--STAT)? No  4. What is your medication issue? Patient has concerns as to if she should stay on the medications listed above, Dr. Gwenlyn Found told her she should stay on the PLAVIX for a year and its been over a year. She says that when she goes to the pharmacy they ask her why is she on PLAVIX and ELIQUIS, patient wants to know if Dr. Gwenlyn Found is going to take her off both meds. Patient is requesting a call back on [phone].

## 2020-02-14 ENCOUNTER — Other Ambulatory Visit: Payer: Self-pay | Admitting: Cardiology

## 2020-03-17 ENCOUNTER — Ambulatory Visit: Payer: Medicare HMO | Admitting: Cardiology

## 2020-03-18 ENCOUNTER — Other Ambulatory Visit: Payer: Self-pay | Admitting: Physician Assistant

## 2020-03-19 ENCOUNTER — Ambulatory Visit: Payer: Medicare HMO | Admitting: Cardiology

## 2020-03-24 ENCOUNTER — Ambulatory Visit: Payer: Medicare HMO | Admitting: Cardiology

## 2020-03-24 ENCOUNTER — Encounter: Payer: Self-pay | Admitting: Cardiology

## 2020-03-24 ENCOUNTER — Other Ambulatory Visit: Payer: Self-pay

## 2020-03-24 VITALS — BP 112/86 | HR 70 | Ht 67.0 in | Wt 201.8 lb

## 2020-03-24 DIAGNOSIS — Z9861 Coronary angioplasty status: Secondary | ICD-10-CM | POA: Diagnosis not present

## 2020-03-24 DIAGNOSIS — R002 Palpitations: Secondary | ICD-10-CM

## 2020-03-24 DIAGNOSIS — I480 Paroxysmal atrial fibrillation: Secondary | ICD-10-CM

## 2020-03-24 DIAGNOSIS — I2510 Atherosclerotic heart disease of native coronary artery without angina pectoris: Secondary | ICD-10-CM | POA: Diagnosis not present

## 2020-03-24 NOTE — Assessment & Plan Note (Signed)
PACs

## 2020-03-24 NOTE — Patient Instructions (Signed)
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
## 2020-04-23 ENCOUNTER — Other Ambulatory Visit: Payer: Self-pay | Admitting: Nurse Practitioner

## 2020-04-23 ENCOUNTER — Other Ambulatory Visit: Payer: Self-pay | Admitting: *Deleted

## 2020-04-23 DIAGNOSIS — E785 Hyperlipidemia, unspecified: Secondary | ICD-10-CM

## 2020-04-23 MED ORDER — ROSUVASTATIN CALCIUM 20 MG PO TABS: 20.0000 mg | ORAL_TABLET | Freq: Every day | ORAL | 1 refills | Status: DC

## 2020-04-23 NOTE — Telephone Encounter (Signed)
Pharmacy requested refill for patient for Crestor 10mg . Rx was refused because the medication was increased to 20mg  12/19/2019    Lauree Chandler, NP  12/19/2019 9:23 AM EDT      Thyroid at goal, kidney function, electrolytes, liver function stable. Blood count stable.  Cholesterol is stil not at goal (ldl should be less than 70) would recommend increasing crestor to 20 mg daily at this time Keep follow up as scheduled

## 2020-04-24 ENCOUNTER — Other Ambulatory Visit: Payer: Self-pay | Admitting: Physician Assistant

## 2020-04-24 ENCOUNTER — Other Ambulatory Visit: Payer: Self-pay | Admitting: Cardiovascular Disease

## 2020-04-24 NOTE — Telephone Encounter (Signed)
Prescription refill request for Eliquis received. Indication:atrial fibrillation Last office visit:1/22 Lurena Joiner Scr:1.1  10/21 Age: 80 Weight:91.5 kg  Prescription refilled

## 2020-05-22 ENCOUNTER — Telehealth: Payer: Self-pay | Admitting: Cardiovascular Disease

## 2020-05-22 MED ORDER — NITROGLYCERIN 0.4 MG SL SUBL: 0.4000 mg | SUBLINGUAL_TABLET | SUBLINGUAL | 4 refills | Status: DC | PRN

## 2020-05-22 NOTE — Telephone Encounter (Signed)
*  STAT* If patient is at the pharmacy, call can be transferred to refill team.   1. Which medications need to be refilled? (please list name of each medication and dose if known)   nitroGLYCERIN (NITROSTAT) 0.4 MG SL tablet   2. Which pharmacy/location (including street and city if local pharmacy) is medication to be sent to? [id number] - [place], [place] - [address] AT [place]  3. Do they need a 30 day or 90 day supply? 90  Patient called to say that her pills has expired, so she will like a new prescription for the pill. Patients states she dont take them everyday its just when needed.

## 2020-05-22 NOTE — Telephone Encounter (Signed)
Refill sent to the pharmacy electronically.  

## 2020-05-23 ENCOUNTER — Other Ambulatory Visit: Payer: Self-pay | Admitting: Nurse Practitioner

## 2020-05-23 DIAGNOSIS — E039 Hypothyroidism, unspecified: Secondary | ICD-10-CM

## 2020-05-26 ENCOUNTER — Other Ambulatory Visit: Payer: Self-pay | Admitting: Nurse Practitioner

## 2020-07-25 ENCOUNTER — Other Ambulatory Visit: Payer: Self-pay | Admitting: Nurse Practitioner

## 2020-07-28 NOTE — Telephone Encounter (Signed)
Please call pt and have her set up appt for further refills. Ok to refill #30 without further refills until appt

## 2020-07-29 ENCOUNTER — Other Ambulatory Visit: Payer: Self-pay

## 2020-07-29 ENCOUNTER — Ambulatory Visit: Payer: Medicare HMO | Admitting: Family

## 2020-07-29 ENCOUNTER — Encounter: Payer: Self-pay | Admitting: Family

## 2020-07-29 DIAGNOSIS — M5416 Radiculopathy, lumbar region: Secondary | ICD-10-CM | POA: Diagnosis not present
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
## 2020-08-03 ENCOUNTER — Telehealth: Payer: Self-pay | Admitting: Family

## 2020-08-03 ENCOUNTER — Other Ambulatory Visit: Payer: Self-pay | Admitting: Physician Assistant

## 2020-08-03 NOTE — Telephone Encounter (Signed)
Patient called. She would like prednisone called in for her pain. Her call back number is [phone]

## 2020-08-03 NOTE — Telephone Encounter (Signed)
This pt is pending sch of ESI with FN please see message below and advise.

## 2020-08-04 MED ORDER — PREDNISONE 10 MG PO TABS: 10.0000 mg | ORAL_TABLET | ORAL | 0 refills | Status: DC | PRN

## 2020-08-10 ENCOUNTER — Ambulatory Visit: Payer: Self-pay

## 2020-08-10 ENCOUNTER — Encounter: Payer: Self-pay | Admitting: Physical Medicine and Rehabilitation

## 2020-08-10 ENCOUNTER — Ambulatory Visit (INDEPENDENT_AMBULATORY_CARE_PROVIDER_SITE_OTHER): Payer: Medicare HMO | Admitting: Physical Medicine and Rehabilitation

## 2020-08-10 ENCOUNTER — Other Ambulatory Visit: Payer: Self-pay

## 2020-08-10 VITALS — BP 118/78 | HR 56

## 2020-08-10 DIAGNOSIS — M5416 Radiculopathy, lumbar region: Secondary | ICD-10-CM | POA: Diagnosis not present

## 2020-08-10 MED ORDER — METHYLPREDNISOLONE ACETATE 80 MG/ML IJ SUSP
80.0000 mg | Freq: Once | INTRAMUSCULAR | Status: AC
  Administered 2020-08-10: 80 mg

## 2020-08-10 NOTE — Progress Notes (Signed)
Pt state lower back pain that travels her right buttocks and down her right leg. Pt state any movement makes the pain worse. Pt state she takes pain meds and injections to help ease her pain. Pt has hx of inj on 11/20/19 pt state It helped.  Numeric Pain Rating Scale and Functional Assessment Average Pain 7   In the last MONTH (on 0-10 scale) has pain interfered with the following?  1. General activity like being  able to carry out your everyday physical activities such as walking, climbing stairs, carrying groceries, or moving a chair?  Rating(10)   +Driver, +BT, -Dye Allergies.

## 2020-08-10 NOTE — Patient Instructions (Signed)

## 2020-08-10 NOTE — Progress Notes (Signed)
Tamara Larson - 80 y.o. female MRN [id number]  Date of birth: March 08, 1940  Office Visit Note: Visit Date: 08/10/2020 PCP: Lauree Chandler, NP Referred by: Lauree Chandler, NP  Subjective: Chief Complaint  Patient presents with   Lower Back - Pain   Right Leg - Pain   HPI:  Tamara Larson is a 80 y.o. female who comes in today at the request of Dondra Prader, FN-P for planned Right L5-S1 Lumbar Transforaminal epidural steroid injection with fluoroscopic guidance.  The patient has failed conservative care including home exercise, medications, time and activity modification.  This injection will be diagnostic and hopefully therapeutic.  Please see requesting physician notes for further details and justification. MRI reviewed with images and spine model.  MRI reviewed in the note below.     ROS Otherwise per HPI.  Assessment & Plan: Visit Diagnoses:    ICD-10-CM   1. Lumbar radiculopathy  M54.16 XR C-ARM NO REPORT    Epidural Steroid injection    methylPREDNISolone acetate (DEPO-MEDROL) injection 80 mg      Plan: No additional findings.   Meds & Orders:  Meds ordered this encounter  Medications   methylPREDNISolone acetate (DEPO-MEDROL) injection 80 mg    Orders Placed This Encounter  Procedures   XR C-ARM NO REPORT   Epidural Steroid injection    Follow-up: No follow-ups on file.   Procedures: No procedures performed      Clinical History: Lumbosacral radiculopathy. Right hip pain. Back surgery 2005   EXAM: MRI LUMBAR SPINE WITHOUT CONTRAST   TECHNIQUE: Multiplanar, multisequence MR imaging of the lumbar spine was performed. No intravenous contrast was administered.   COMPARISON:  Lumbar radiographs 08/23/2017. No prior MRI for comparison.   FINDINGS: Segmentation:  Normal   Alignment:  Mild scoliosis.  Normal sagittal alignment   Vertebrae:  Negative for fracture or mass   Conus medullaris and cauda equina: Conus extends to the L1-2  level. Conus and cauda equina appear normal.   Paraspinal and other soft tissues: Bilateral renal cysts. No retroperitoneal mass or adenopathy.   Disc levels:   T11-12: Marked right foraminal encroachment due to asymmetric facet hypertrophy and disc bulging   T12-L1: Marked right foraminal encroachment due to asymmetric disc degeneration and spurring on the right as well as bilateral facet hypertrophy.   L1-2: Moderate spinal stenosis. Marked disc degeneration and spondylosis. Moderate to severe facet degeneration bilaterally. Marked foraminal stenosis bilaterally.   L2-3: Mild spinal stenosis. Moderate disc degeneration and disc bulging, left greater than right. Moderate facet hypertrophy bilaterally. Marked left foraminal encroachment due to spurring   L3-4: Decompressive laminectomy. Spinal canal adequate in size. Moderate to advanced disc degeneration with diffuse endplate spurring. Moderate left foraminal encroachment.   L4-5: Posterior decompression. Mild spinal stenosis. Marked disc degeneration and spondylosis. Disc bulging and diffuse endplate spurring. Moderate facet hypertrophy. Marked foraminal encroachment bilaterally.   L5-S1 mild disc degeneration. Severe facet hypertrophy bilaterally with moderate subarticular stenosis bilaterally.   IMPRESSION: Advanced degenerative change throughout the lumbar spine. Extensive disc degeneration and spondylosis. Extensive facet degeneration. Multilevel spinal and foraminal stenosis throughout the lumbar spine as described above.     Electronically Signed   By: Franchot Gallo M.D.   On: 01/04/2018 06:49     Objective:  VS:  HT:    WT:   BMI:     BP:118/78  HR:(!) 56bpm  TEMP: ( )  RESP:  Physical Exam Vitals and nursing note reviewed.  Constitutional:  General: She is not in acute distress.    Appearance: Normal appearance. She is not ill-appearing.  HENT:     Head: Normocephalic and atraumatic.      Right Ear: External ear normal.     Left Ear: External ear normal.  Eyes:     Extraocular Movements: Extraocular movements intact.  Cardiovascular:     Rate and Rhythm: Normal rate.     Pulses: Normal pulses.  Pulmonary:     Effort: Pulmonary effort is normal. No respiratory distress.  Abdominal:     General: There is no distension.     Palpations: Abdomen is soft.  Musculoskeletal:        General: Tenderness present.     Cervical back: Neck supple.     Right lower leg: No edema.     Left lower leg: No edema.     Comments: Patient has good distal strength with no pain over the greater trochanters.  No clonus or focal weakness.  Skin:    Findings: No erythema, lesion or rash.  Neurological:     General: No focal deficit present.     Mental Status: She is alert and oriented to person, place, and time.     Sensory: No sensory deficit.     Motor: No weakness or abnormal muscle tone.     Coordination: Coordination normal.  Psychiatric:        Mood and Affect: Mood normal.        Behavior: Behavior normal.     Imaging: XR C-ARM NO REPORT  Result Date: 08/10/2020 Please see Notes tab for imaging impression.

## 2020-08-10 NOTE — Procedures (Signed)
Lumbosacral Transforaminal Epidural Steroid Injection - Sub-Pedicular Approach with Fluoroscopic Guidance  Patient: Tamara Larson      Date of Birth: 09-Jan-1941 MRN: [id number] PCP: Lauree Chandler, NP      Visit Date: 08/10/2020   Universal Protocol:    Date/Time: 08/10/2020  Consent Given By: the patient  Position: PRONE  Additional Comments: Vital signs were monitored before and after the procedure. Patient was prepped and draped in the usual sterile fashion. The correct patient, procedure, and site was verified.   Injection Procedure Details:   Procedure diagnoses: Lumbar radiculopathy [M54.16]    Meds Administered:  Meds ordered this encounter  Medications   methylPREDNISolone acetate (DEPO-MEDROL) injection 80 mg    Laterality: Right  Location/Site:  L5-S1  Needle:5.0 in., 22 ga.  Short bevel or Quincke spinal needle  Needle Placement: Transforaminal  Findings:    -Comments: Excellent flow of contrast along the nerve, nerve root and into the epidural space.  Procedure Details: After squaring off the end-plates to get a true AP view, the C-arm was positioned so that an oblique view of the foramen as noted above was visualized. The target area is just inferior to the "nose of the scotty dog" or sub pedicular. The soft tissues overlying this structure were infiltrated with 2-3 ml. of 1% Lidocaine without Epinephrine.  The spinal needle was inserted toward the target using a "trajectory" view along the fluoroscope beam.  Under AP and lateral visualization, the needle was advanced so it did not puncture dura and was located close the 6 O'Clock position of the pedical in AP tracterory. Biplanar projections were used to confirm position. Aspiration was confirmed to be negative for CSF and/or blood. A 1-2 ml. volume of Isovue-250 was injected and flow of contrast was noted at each level. Radiographs were obtained for documentation purposes.   After attaining the  desired flow of contrast documented above, a 0.5 to 1.0 ml test dose of 0.25% Marcaine was injected into each respective transforaminal space.  The patient was observed for 90 seconds post injection.  After no sensory deficits were reported, and normal lower extremity motor function was noted,   the above injectate was administered so that equal amounts of the injectate were placed at each foramen (level) into the transforaminal epidural space.   Additional Comments:  The patient tolerated the procedure well Dressing: 2 x 2 sterile gauze and Band-Aid    Post-procedure details: Patient was observed during the procedure. Post-procedure instructions were reviewed.  Patient left the clinic in stable condition.

## 2020-08-12 ENCOUNTER — Other Ambulatory Visit: Payer: Self-pay | Admitting: Cardiology

## 2020-08-19 ENCOUNTER — Other Ambulatory Visit: Payer: Self-pay

## 2020-08-19 ENCOUNTER — Encounter: Payer: Self-pay | Admitting: Cardiovascular Disease

## 2020-08-19 ENCOUNTER — Ambulatory Visit: Payer: Medicare HMO | Admitting: Cardiovascular Disease

## 2020-08-19 ENCOUNTER — Other Ambulatory Visit: Payer: Self-pay | Admitting: Nurse Practitioner

## 2020-08-19 VITALS — BP 118/78 | HR 92 | Ht 67.0 in | Wt 195.4 lb

## 2020-08-19 DIAGNOSIS — E785 Hyperlipidemia, unspecified: Secondary | ICD-10-CM | POA: Diagnosis not present

## 2020-08-19 DIAGNOSIS — I480 Paroxysmal atrial fibrillation: Secondary | ICD-10-CM

## 2020-08-19 DIAGNOSIS — I2510 Atherosclerotic heart disease of native coronary artery without angina pectoris: Secondary | ICD-10-CM | POA: Diagnosis not present

## 2020-08-19 DIAGNOSIS — Z9861 Coronary angioplasty status: Secondary | ICD-10-CM

## 2020-08-19 DIAGNOSIS — I10 Essential (primary) hypertension: Secondary | ICD-10-CM

## 2020-08-19 LAB — LIPID PANEL
Chol/HDL Ratio: 2.1 ratio (ref 0.0–4.4)
Cholesterol, Total: 206 mg/dL — ABNORMAL HIGH (ref 100–199)
HDL: 99 mg/dL (ref >39)
LDL Chol Calc (NIH): 95 mg/dL (ref 0–99)
Triglycerides: 69 mg/dL (ref 0–149)
VLDL Cholesterol Cal: 12 mg/dL (ref 5–40)

## 2020-08-19 LAB — HEPATIC FUNCTION PANEL
ALT: 18 IU/L (ref 0–32)
AST: 12 IU/L (ref 0–40)
Albumin: 4.5 g/dL (ref 3.7–4.7)
Alkaline Phosphatase: 92 IU/L (ref 44–121)
Bilirubin Total: 0.8 mg/dL (ref 0.0–1.2)
Bilirubin, Direct: 0.24 mg/dL (ref 0.00–0.40)
Total Protein: 6.7 g/dL (ref 6.0–8.5)

## 2020-08-19 NOTE — Assessment & Plan Note (Signed)
History of CAD status post cardiac catheterization which I performed 09/24/2018 via right radial approach revealing high-grade proximal LAD and circumflex disease which I stented using drug-eluting stents.  She did have a 50% mid RCA lesion which was treated medically.  She denies chest pain or shortness of breath.  A Myoview stress test performed 02/06/2019 was nonischemic.

## 2020-08-19 NOTE — Progress Notes (Signed)
Repeat    08/19/2020 Tamara Larson   Aug 28, 1940  [id number]  Primary Physician Lauree Chandler, NP Primary Cardiologist: Lorretta Harp MD Lupe Carney, [place]  HPI:  Tamara Larson is a 80 y.o.  moderately overweight widowed African-American female mother of one child away in 2018 from cancer, grandmother of 3 grandchildren and great grandmother to 3 great-grandchildren referred by Dr. Amalia Hailey for peripheral vascular evaluation because of absent pedal pulses on exam.  I last saw her in the office 08/16/2019.  She has a history of treated hypertension and hyperlipidemia. She was apparently seen for ingrown toenails and apparently feel pulses were not able to be felt although she denies claudication. She had Dopplers that showed normal ABIs. He she does get some mild swelling in her legs on occasion which improved with compression stockings  She was recently seen in the emergency room for atypical chest pain thought to be related to reflux and was treated with a antacid which has improved her symptoms.  She also had a 2D echocardiogram performed 07/28/2017 which was essentially normal except for some calcified mitral leaflets without evidence of stenosis or regurgitation.  She was scheduled to have a coronary CTA but this could not be done because of her heart rate.  She therefore was set up to have an outpatient cath which I performed on 09/24/2018 via the right radial approach revealing high-grade proximal LAD and mid circumflex stenosis which I stented using drug-eluting stents.  She had a 50% mid RCA lesion which was treated medically.  She was on triple therapy for a month after which she discontinued aspirin.  Should be noted that she had an event monitor performed 09/06/2018 that did show PAF for which she was placed on Eliquis oral anticoagulation and low-dose plate beta-blockade.   She was admitted to the hospital 11/18/2018 for 1 day because of a witnessed syncopal episode at home.  On  presentation to the hospital she was found to be in A. fib with RVR, essentially converting to sinus rhythm.  Work-up in the hospitalization otherwise was unrevealing.  She denies chest pain or shortness of breath.  She was found to be hyperthyroid with a TSH measured at 0.258.  Her Synthroid dose was down titrated.  It is possible that this was contributing to her atrial fibrillation.   Since I saw her in the office 1 year ago, she has done well.  She denies chest pain or shortness of breath.   Current Meds  Medication Sig   acetaminophen (TYLENOL) 650 MG CR tablet Take 650 mg by mouth every 8 (eight) hours as needed for pain.   amLODipine (NORVASC) 10 MG tablet Take 1 tablet (10 mg total) by mouth daily. NEEDS APPT FOR ADDITIONAL REFILLS   aspirin EC 81 MG tablet Take 81 mg by mouth daily. Swallow whole.   ELIQUIS 5 MG TABS tablet TAKE 1 TABLET BY MOUTH TWICE A DAY   levothyroxine (SYNTHROID) 75 MCG tablet TAKE 1 TABLET (75 MCG TOTAL) BY MOUTH DAILY BEFORE BREAKFAST.   lidocaine (LIDODERM) 5 % Place 1 patch onto the skin daily. Remove & Discard patch within 12 hours or as directed by MD   losartan (COZAAR) 100 MG tablet TAKE 1 TABLET BY MOUTH EVERY DAY   metoprolol tartrate (LOPRESSOR) 25 MG tablet TAKE 0.5 TABLETS 2 TIMES DAILY. YOU CAN TAKE A EXTRA 12.5MG  ONCE A DAY AS NEEDED FOR PALPITATIONS   Multiple Vitamin (MULTIVITAMIN WITH MINERALS) TABS tablet Take 1 tablet  by mouth daily. Centrum Silver   nitroGLYCERIN (NITROSTAT) 0.4 MG SL tablet Place 1 tablet (0.4 mg total) under the tongue every 5 (five) minutes as needed for chest pain.   pantoprazole (PROTONIX) 40 MG tablet Take 40 mg by mouth as needed.   rosuvastatin (CRESTOR) 20 MG tablet Take 1 tablet (20 mg total) by mouth daily. VOID all refills for 10 mg tablet     Allergies  Allergen Reactions   Shellfish Allergy Swelling    Social History   Socioeconomic History   Marital status: Widowed    Spouse name: Not on file   Number  of children: Not on file   Years of education: Not on file   Highest education level: Not on file  Occupational History   Not on file  Tobacco Use   Smoking status: Former    Packs/day: 0.00    Pack years: 0.00    Types: Cigarettes   Smokeless tobacco: Never   Tobacco comments:    Quit about age 5   Vaping Use   Vaping Use: Never used  Substance and Sexual Activity   Alcohol use: No    Alcohol/week: 0.0 standard drinks   Drug use: No   Sexual activity: Never    Birth control/protection: Post-menopausal, Surgical    Comment: 1st intercourse 80 yo-Fewer than 5 partners  Other Topics Concern   Not on file  Social History Narrative   Do you drink/eat things with caffeine? Yes, coffee   Was married x 26 years, widow   Lives in a one stories house, one person, no pets   Past/Current profession- Sales executive   Patient exercises daily    Social Determinants of Health   Financial Resource Strain: Not on file  Food Insecurity: Not on file  Transportation Needs: Not on file  Physical Activity: Not on file  Stress: Not on file  Social Connections: Not on file  Intimate Partner Violence: Not on file     Review of Systems: General: negative for chills, fever, night sweats or weight changes.  Cardiovascular: negative for chest pain, dyspnea on exertion, edema, orthopnea, palpitations, paroxysmal nocturnal dyspnea or shortness of breath Dermatological: negative for rash Respiratory: negative for cough or wheezing Urologic: negative for hematuria Abdominal: negative for nausea, vomiting, diarrhea, bright red blood per rectum, melena, or hematemesis Neurologic: negative for visual changes, syncope, or dizziness All other systems reviewed and are otherwise negative except as noted above.    Blood pressure 118/78, pulse 92, height 5\' 7"  (1.702 m), weight 195 lb 6.4 oz (88.6 kg).  General appearance: alert and no distress Neck: no adenopathy, no carotid bruit, no JVD,  supple, symmetrical, trachea midline, and thyroid not enlarged, symmetric, no tenderness/mass/nodules Lungs: clear to auscultation bilaterally Heart: irregularly irregular rhythm Extremities: extremities normal, atraumatic, no cyanosis or edema Pulses: 2+ and symmetric Skin: Skin color, texture, turgor normal. No rashes or lesions Neurologic: Grossly normal  EKG not performed today  ASSESSMENT AND PLAN:   Dyslipidemia, goal LDL below 70 History of dyslipidemia on rosuvastatin 20 mg a day with lipid profile performed 12/18/2019 revealing total cholesterol 203, LDL of 93 and HDL of 91.  Her PCP did increase her Crestor from 10 to 20 mg a day.  We will recheck a lipid liver profile this morning.  Essential hypertension History of essential hypertension blood pressure measured today 118/78.  She is on amlodipine, losartan and metoprolol.  CAD S/P PCI History of CAD status post cardiac catheterization which I  performed 09/24/2018 via right radial approach revealing high-grade proximal LAD and circumflex disease which I stented using drug-eluting stents.  She did have a 50% mid RCA lesion which was treated medically.  She denies chest pain or shortness of breath.  A Myoview stress test performed 02/06/2019 was nonischemic.  Paroxysmal atrial fibrillation (HCC) History of paroxysmal A. fib rate controlled on Eliquis oral anticoagulation.  She does appear to be in A. fib in A. fib today on exam although an EKG was not performed.  Her last EKG 03/24/2020 revealed sinus rhythm.     Lorretta Harp MD FACP,FACC,FAHA, [facility] 08/19/2020 9:31 AM

## 2020-08-19 NOTE — Patient Instructions (Signed)
Medication Instructions:  The current medical regimen is effective;  continue present plan and medications.  *If you need a refill on your cardiac medications before your next appointment, please call your pharmacy*   Lab Work: LIPID/LIVER today  If you have labs (blood work) drawn today and your tests are completely normal, you will receive your results only by: . MyChart Message (if you have MyChart) OR . A paper copy in the mail If you have any lab test that is abnormal or we need to change your treatment, we will call you to review the results.   Follow-Up: At [facility], you and your health needs are our priority.  As part of our continuing mission to provide you with exceptional heart care, we have created designated Provider Care Teams.  These Care Teams include your primary Cardiologist (physician) and Advanced Practice Providers (APPs -  Physician Assistants and Nurse Practitioners) who all work together to provide you with the care you need, when you need it.  We recommend signing up for the patient portal called "MyChart".  Sign up information is provided on this After Visit Summary.  MyChart is used to connect with patients for Virtual Visits (Telemedicine).  Patients are able to view lab/test results, encounter notes, upcoming appointments, etc.  Non-urgent messages can be sent to your provider as well.   To learn more about what you can do with MyChart, go to [email or web address].    Your next appointment:   12 month(s)  The format for your next appointment:   In Person  Provider:   Jonathan Berry, MD      

## 2020-08-19 NOTE — Assessment & Plan Note (Signed)
History of dyslipidemia on rosuvastatin 20 mg a day with lipid profile performed 12/18/2019 revealing total cholesterol 203, LDL of 93 and HDL of 91.  Her PCP did increase her Crestor from 10 to 20 mg a day.  We will recheck a lipid liver profile this morning.

## 2020-08-19 NOTE — Assessment & Plan Note (Signed)
History of paroxysmal A. fib rate controlled on Eliquis oral anticoagulation.  She does appear to be in A. fib in A. fib today on exam although an EKG was not performed.  Her last EKG 03/24/2020 revealed sinus rhythm.

## 2020-08-19 NOTE — Assessment & Plan Note (Signed)
History of essential hypertension blood pressure measured today 118/78.  She is on amlodipine, losartan and metoprolol.

## 2020-08-21 ENCOUNTER — Ambulatory Visit: Payer: Medicare HMO | Admitting: Nurse Practitioner

## 2020-08-24 ENCOUNTER — Ambulatory Visit (INDEPENDENT_AMBULATORY_CARE_PROVIDER_SITE_OTHER): Payer: Medicare HMO | Admitting: Nurse Practitioner

## 2020-08-24 ENCOUNTER — Other Ambulatory Visit: Payer: Self-pay

## 2020-08-24 ENCOUNTER — Encounter: Payer: Self-pay | Admitting: Nurse Practitioner

## 2020-08-24 ENCOUNTER — Telehealth: Payer: Self-pay

## 2020-08-24 VITALS — BP 130/82 | HR 85 | Temp 96.9°F | Ht 67.0 in | Wt 196.2 lb

## 2020-08-24 DIAGNOSIS — I2510 Atherosclerotic heart disease of native coronary artery without angina pectoris: Secondary | ICD-10-CM

## 2020-08-24 DIAGNOSIS — E785 Hyperlipidemia, unspecified: Secondary | ICD-10-CM

## 2020-08-24 DIAGNOSIS — I480 Paroxysmal atrial fibrillation: Secondary | ICD-10-CM

## 2020-08-24 DIAGNOSIS — E034 Atrophy of thyroid (acquired): Secondary | ICD-10-CM | POA: Diagnosis not present

## 2020-08-24 DIAGNOSIS — M5416 Radiculopathy, lumbar region: Secondary | ICD-10-CM

## 2020-08-24 DIAGNOSIS — K219 Gastro-esophageal reflux disease without esophagitis: Secondary | ICD-10-CM

## 2020-08-24 DIAGNOSIS — Z9861 Coronary angioplasty status: Secondary | ICD-10-CM

## 2020-08-24 DIAGNOSIS — I10 Essential (primary) hypertension: Secondary | ICD-10-CM

## 2020-08-24 DIAGNOSIS — E6609 Other obesity due to excess calories: Secondary | ICD-10-CM

## 2020-08-24 DIAGNOSIS — Z6830 Body mass index (BMI) 30.0-30.9, adult: Secondary | ICD-10-CM

## 2020-08-24 MED ORDER — ROSUVASTATIN CALCIUM 40 MG PO TABS: 40.0000 mg | ORAL_TABLET | Freq: Every day | ORAL | 1 refills | Status: DC
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
## 2020-08-24 NOTE — Telephone Encounter (Signed)
Pt called and would like a call regarding another apt. Pt states that something just isn't right.

## 2020-08-24 NOTE — Patient Instructions (Signed)
To get Tdap and shingrix vaccine at your local pharmacy

## 2020-08-24 NOTE — Telephone Encounter (Signed)
Pt state back pain started last Thursday that keeps her up at night. Pt state the pain still on her right side and down her leg and the pain feels like its going up her back. Pt last inj was on 08/10/20 Right L4-L5 TF. Please advise.

## 2020-08-25 ENCOUNTER — Other Ambulatory Visit: Payer: Self-pay

## 2020-08-25 DIAGNOSIS — I2510 Atherosclerotic heart disease of native coronary artery without angina pectoris: Secondary | ICD-10-CM

## 2020-08-25 DIAGNOSIS — E785 Hyperlipidemia, unspecified: Secondary | ICD-10-CM

## 2020-08-25 LAB — CBC WITH DIFFERENTIAL/PLATELET
Absolute Monocytes: 770 cells/uL (ref 200–950)
Basophils Absolute: 55 cells/uL (ref 0–200)
Basophils Relative: 0.5 %
Eosinophils Absolute: 66 cells/uL (ref 15–500)
Eosinophils Relative: 0.6 %
HCT: 36 % (ref 35.0–45.0)
Hemoglobin: 11.4 g/dL — ABNORMAL LOW (ref 11.7–15.5)
Lymphs Abs: 2486 cells/uL (ref 850–3900)
MCH: 29.8 pg (ref 27.0–33.0)
MCHC: 31.7 g/dL — ABNORMAL LOW (ref 32.0–36.0)
MCV: 94.2 fL (ref 80.0–100.0)
MPV: 10.6 fL (ref 7.5–12.5)
Monocytes Relative: 7 %
Neutro Abs: 7623 cells/uL (ref 1500–7800)
Neutrophils Relative %: 69.3 %
Platelets: 273 10*3/uL (ref 140–400)
RBC: 3.82 10*6/uL (ref 3.80–5.10)
RDW: 14.2 % (ref 11.0–15.0)
Total Lymphocyte: 22.6 %
WBC: 11 10*3/uL — ABNORMAL HIGH (ref 3.8–10.8)

## 2020-08-25 LAB — BASIC METABOLIC PANEL WITH GFR
BUN/Creatinine Ratio: 15 (calc) (ref 6–22)
BUN: 20 mg/dL (ref 7–25)
CO2: 26 mmol/L (ref 20–32)
Calcium: 9.1 mg/dL (ref 8.6–10.4)
Chloride: 106 mmol/L (ref 98–110)
Creat: 1.36 mg/dL — ABNORMAL HIGH (ref 0.60–0.93)
GFR, Est African American: 43 mL/min/{1.73_m2} — ABNORMAL LOW (ref >=60)
GFR, Est Non African American: 37 mL/min/{1.73_m2} — ABNORMAL LOW (ref >=60)
Glucose, Bld: 79 mg/dL (ref 65–99)
Potassium: 3.4 mmol/L — ABNORMAL LOW (ref 3.5–5.3)
Sodium: 141 mmol/L (ref 135–146)

## 2020-08-30 ENCOUNTER — Other Ambulatory Visit: Payer: Self-pay | Admitting: Family

## 2020-09-10 IMAGING — MR MR LUMBAR SPINE W/O CM
5 series · 44 of 48 positions shown · non-contrast
Comparison: Lumbar radiographs 08/23/2017. No prior MRI for
comparison.

CLINICAL DATA: Lumbosacral radiculopathy. Right hip pain. Back
surgery [id number]

EXAM:
MRI LUMBAR SPINE WITHOUT CONTRAST
TECHNIQUE: Multiplanar, multisequence MR imaging of the lumbar spine was
performed. No intravenous contrast was administered.

[Series 5: tirm sag · sagittal · 4.0mm · 0.55mm/px · 6 of 16 slices shown]
[im 1/16]
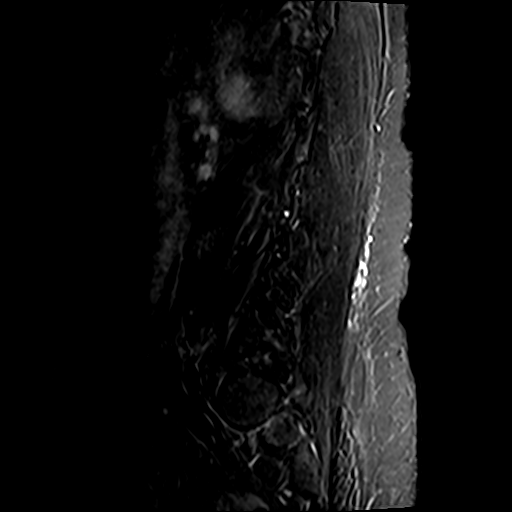
[im 4/16]
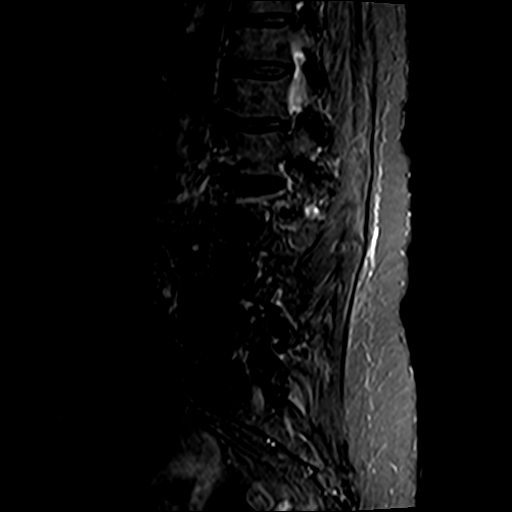
[im 7/16]
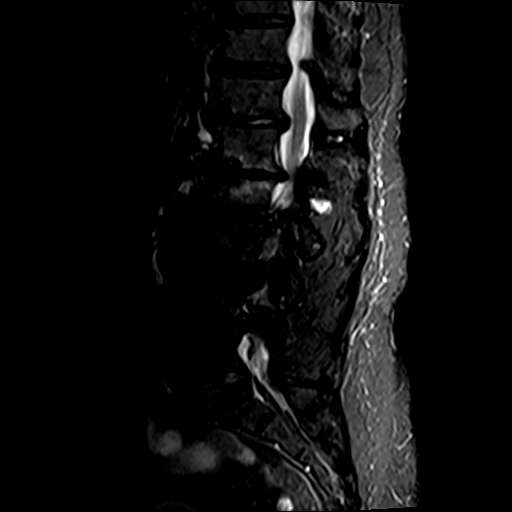
[im 10/16]
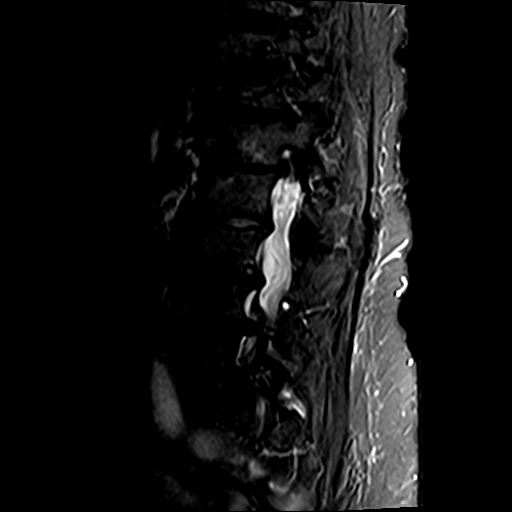
[im 13/16]
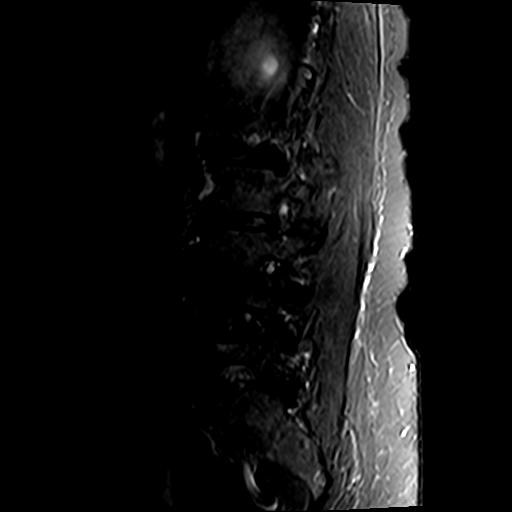
[im 16/16]
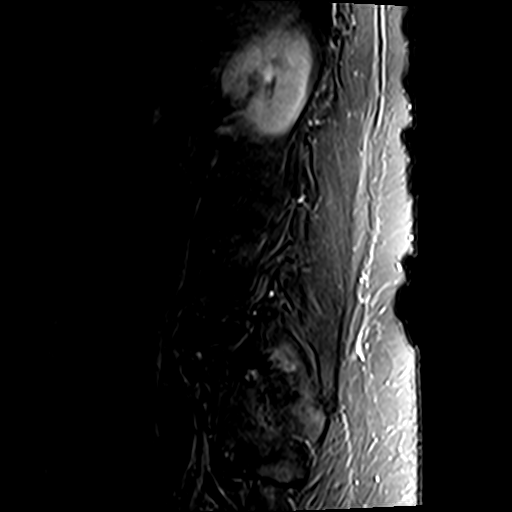

[Series 6: T2 · sagittal · 4.0mm · 0.88mm/px · 5 of 16 slices shown (1 of 2)]
[im 1/16]
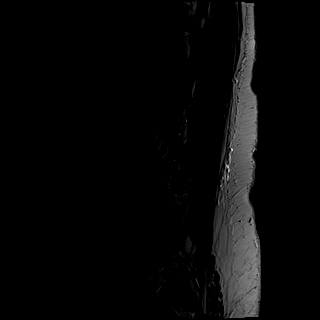
[im 4/16]
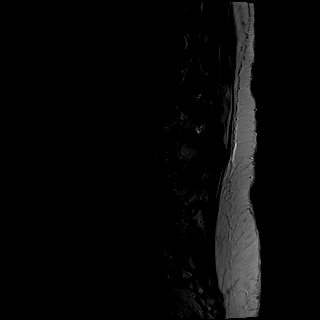
[im 8/16]
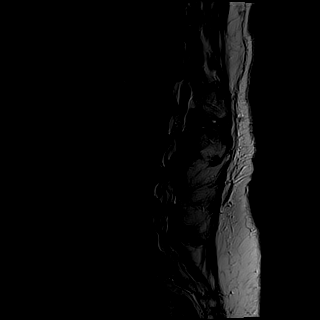
[im 12/16]
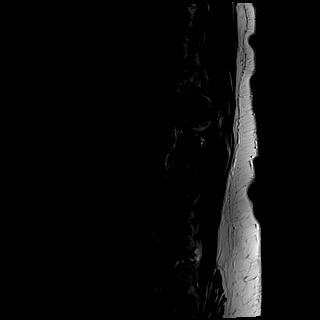
[im 16/16]
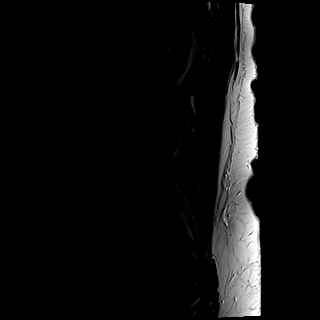

[Series 7: T1 · sagittal · 4.0mm · 0.88mm/px · 5 of 16 slices shown (1 of 2)]
[im 1/16]
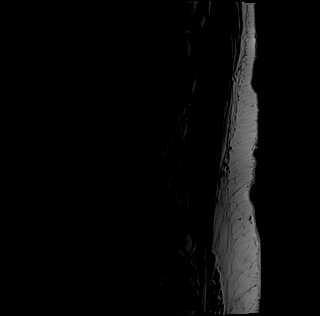
[im 4/16]
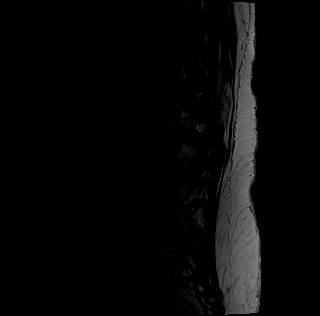
[im 8/16]
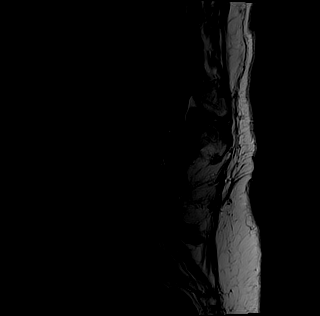
[im 12/16]
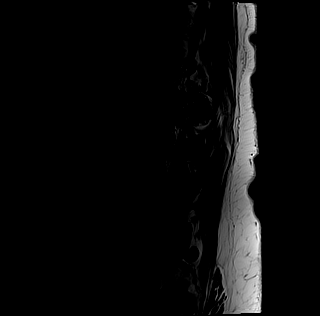
[im 16/16]
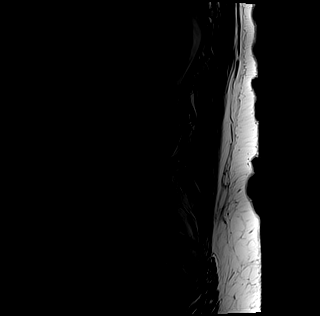

[Series 8: T1 · axial · 4.0mm · 0.78mm/px · z∈[-20,+226]mm · 12 of 49 slices shown (2 of 2)]
[im 1/49]
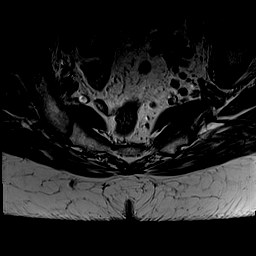
[im 4/49]
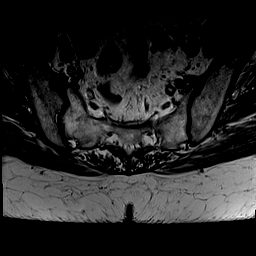
[im 7/49]
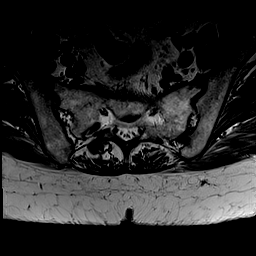
[im 10/49]
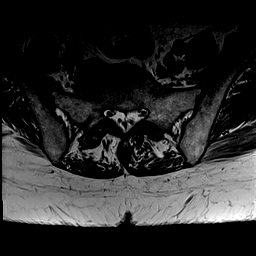
[im 13/49]
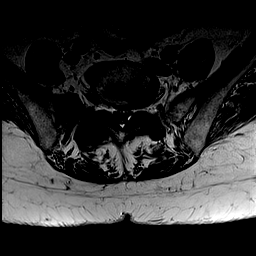
[im 17/49]
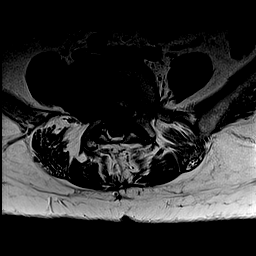
[im 23/49]
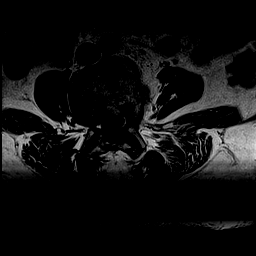
[im 26/49]
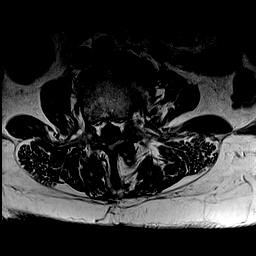
[im 29/49]
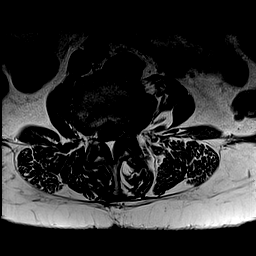
[im 36/49]
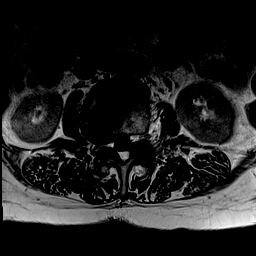
[im 42/49]
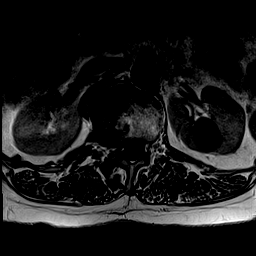
[im 49/49]
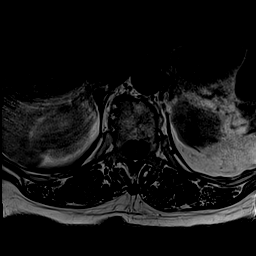

[Series 9: T2 · axial · 4.0mm · 0.78mm/px · z∈[-20,+226]mm · 16 of 49 slices shown (2 of 2)]
[im 1/49]
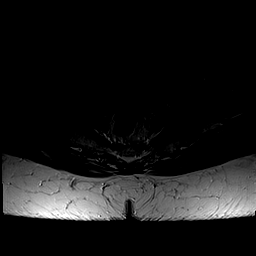
[im 4/49]
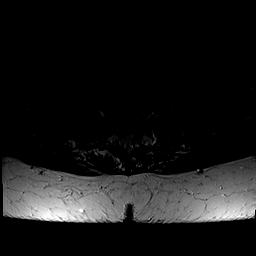
[im 7/49]
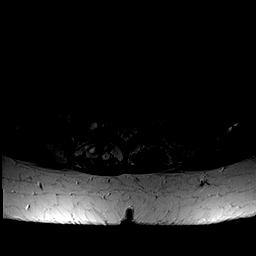
[im 10/49]
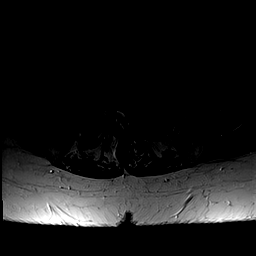
[im 13/49]
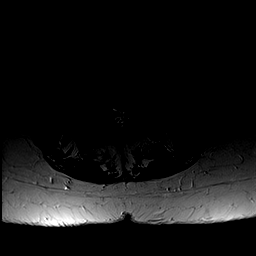
[im 17/49]
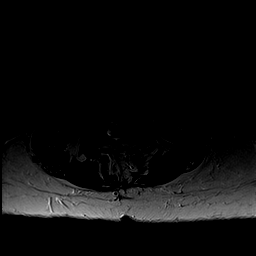
[im 20/49]
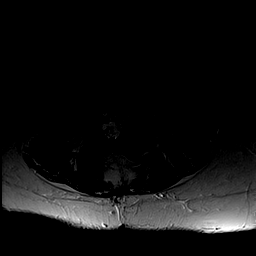
[im 23/49]
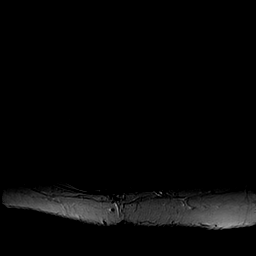
[im 26/49]
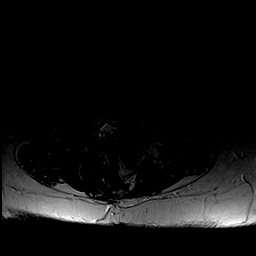
[im 29/49]
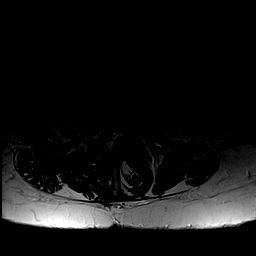
[im 33/49]
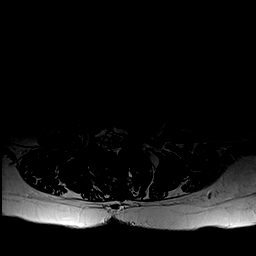
[im 36/49]
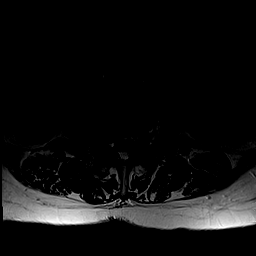
[im 39/49]
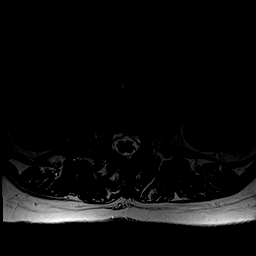
[im 42/49]
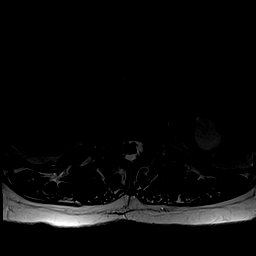
[im 45/49]
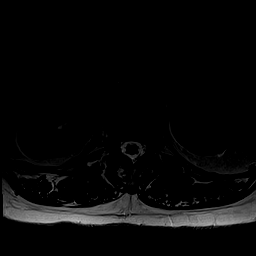
[im 49/49]
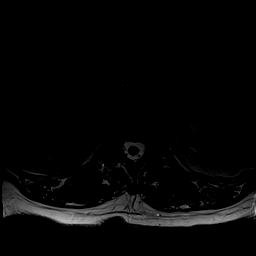

[44 of 48 positions shown; findings below may reference images not displayed]

FINDINGS: Segmentation:  Normal

Alignment:  Mild scoliosis.  Normal sagittal alignment

Vertebrae:  Negative for fracture or mass

Conus medullaris and cauda equina: Conus extends to the L1-2 level.
Conus and cauda equina appear normal.

Paraspinal and other soft tissues: Bilateral renal cysts. No
retroperitoneal mass or adenopathy.

Disc levels:

T11-12: Marked right foraminal encroachment due to asymmetric facet
hypertrophy and disc bulging

T12-L1: Marked right foraminal encroachment due to asymmetric disc
degeneration and spurring on the right as well as bilateral facet
hypertrophy.

L1-2: Moderate spinal stenosis. Marked disc degeneration and
spondylosis. Moderate to severe facet degeneration bilaterally.
Marked foraminal stenosis bilaterally.

L2-3: Mild spinal stenosis. Moderate disc degeneration and disc
bulging, left greater than right. Moderate facet hypertrophy
bilaterally. Marked left foraminal encroachment due to spurring

L3-4: Decompressive laminectomy. Spinal canal adequate in size.
Moderate to advanced disc degeneration with diffuse endplate
spurring. Moderate left foraminal encroachment.

L4-5: Posterior decompression. Mild spinal stenosis. Marked disc
degeneration and spondylosis. Disc bulging and diffuse endplate
spurring. Moderate facet hypertrophy. Marked foraminal encroachment
bilaterally.

L5-S1 mild disc degeneration. Severe facet hypertrophy bilaterally
with moderate subarticular stenosis bilaterally.
IMPRESSION: Advanced degenerative change throughout the lumbar spine. Extensive
disc degeneration and spondylosis. Extensive facet degeneration.
Multilevel spinal and foraminal stenosis throughout the lumbar spine
as described above.

## 2020-09-14 ENCOUNTER — Encounter: Payer: Self-pay | Admitting: Physical Medicine and Rehabilitation

## 2020-09-14 ENCOUNTER — Ambulatory Visit: Payer: Self-pay

## 2020-09-14 ENCOUNTER — Other Ambulatory Visit: Payer: Self-pay

## 2020-09-14 ENCOUNTER — Ambulatory Visit (INDEPENDENT_AMBULATORY_CARE_PROVIDER_SITE_OTHER): Payer: Medicare HMO | Admitting: Physical Medicine and Rehabilitation

## 2020-09-14 VITALS — BP 101/71 | HR 106

## 2020-09-14 DIAGNOSIS — M5416 Radiculopathy, lumbar region: Secondary | ICD-10-CM | POA: Diagnosis not present

## 2020-09-14 DIAGNOSIS — M961 Postlaminectomy syndrome, not elsewhere classified: Secondary | ICD-10-CM

## 2020-09-14 DIAGNOSIS — G894 Chronic pain syndrome: Secondary | ICD-10-CM

## 2020-09-14 DIAGNOSIS — M48061 Spinal stenosis, lumbar region without neurogenic claudication: Secondary | ICD-10-CM

## 2020-09-14 DIAGNOSIS — M5412 Radiculopathy, cervical region: Secondary | ICD-10-CM

## 2020-09-14 MED ORDER — BETAMETHASONE SOD PHOS & ACET 6 (3-3) MG/ML IJ SUSP
12.0000 mg | Freq: Once | INTRAMUSCULAR | Status: AC
  Administered 2020-09-14: 12 mg

## 2020-09-14 NOTE — Patient Instructions (Signed)

## 2020-09-14 NOTE — Progress Notes (Signed)
Tamara Larson - 80 y.o. female MRN [id number]  Date of birth: 1940-08-17  Office Visit Note: Visit Date: 09/14/2020 PCP: Lauree Chandler, NP Referred by: Lauree Chandler, NP  Subjective: Chief Complaint  Patient presents with   Lower Back - Pain   Right Hip - Pain   HPI:  Tamara Larson is a 80 y.o. female who comes in today for planned repeat Right L5-S1  Lumbar Transforaminal epidural steroid injection with fluoroscopic guidance.  The patient has failed conservative care including home exercise, medications, time and activity modification.  This injection will be diagnostic and hopefully therapeutic.  Please see requesting physician notes for further details and justification. Patient received more than 50% pain relief from prior injection.  Prior L5 injection before the last 1 was excellent and did last quite a while.  The last injection did help significantly but this did not last as long.  I do think repeating this is appropriate.  Her symptoms are more L5 distribution with posterior lateral referral down to the ankle.  MRI findings from 2019 shows severe foraminal stenosis at L4 also lateral recess narrowing that could impact L5 particularly on the right.  She has significant facet arthropathy of the lower levels.  She has a history of prior lumbar decompressive surgery.  Patient would ultimately be a candidate for spinal cord stimulator trial.  This was not discussed today.  Referring: Dondra Prader, FNP   ROS Otherwise per HPI.  Assessment & Plan: Visit Diagnoses:    ICD-10-CM   1. Lumbar radiculopathy  M54.16 XR C-ARM NO REPORT    Epidural Steroid injection    betamethasone acetate-betamethasone sodium phosphate (CELESTONE) injection 12 mg    2. Cervical radiculopathy  M54.12     3. Foraminal stenosis of lumbar region  M48.061     4. Post laminectomy syndrome  M96.1     5. Chronic pain syndrome  G89.4       Plan: No additional findings.   Meds & Orders:  Meds  ordered this encounter  Medications   betamethasone acetate-betamethasone sodium phosphate (CELESTONE) injection 12 mg    Orders Placed This Encounter  Procedures   XR C-ARM NO REPORT   Epidural Steroid injection    Follow-up: Return if symptoms worsen or fail to improve.   Procedures: No procedures performed  Lumbosacral Transforaminal Epidural Steroid Injection - Sub-Pedicular Approach with Fluoroscopic Guidance  Patient: Tamara Larson      Date of Birth: 05/16/1940 MRN: [id number] PCP: Lauree Chandler, NP      Visit Date: 09/14/2020   Universal Protocol:    Date/Time: 09/14/2020  Consent Given By: the patient  Position: PRONE  Additional Comments: Vital signs were monitored before and after the procedure. Patient was prepped and draped in the usual sterile fashion. The correct patient, procedure, and site was verified.   Injection Procedure Details:   Procedure diagnoses: Lumbar radiculopathy [M54.16]    Meds Administered:  Meds ordered this encounter  Medications   betamethasone acetate-betamethasone sodium phosphate (CELESTONE) injection 12 mg    Laterality: Right  Location/Site:  L5-S1  Needle:5.0 in., 22 ga.  Short bevel or Quincke spinal needle  Needle Placement: Transforaminal  Findings:    -Comments: Excellent flow of contrast along the nerve, nerve root and into the epidural space.  Procedure Details: After squaring off the end-plates to get a true AP view, the C-arm was positioned so that an oblique view of the foramen as noted above was  visualized. The target area is just inferior to the "nose of the scotty dog" or sub pedicular. The soft tissues overlying this structure were infiltrated with 2-3 ml. of 1% Lidocaine without Epinephrine.  The spinal needle was inserted toward the target using a "trajectory" view along the fluoroscope beam.  Under AP and lateral visualization, the needle was advanced so it did not puncture dura and was located  close the 6 O'Clock position of the pedical in AP tracterory. Biplanar projections were used to confirm position. Aspiration was confirmed to be negative for CSF and/or blood. A 1-2 ml. volume of Isovue-250 was injected and flow of contrast was noted at each level. Radiographs were obtained for documentation purposes.   After attaining the desired flow of contrast documented above, a 0.5 to 1.0 ml test dose of 0.25% Marcaine was injected into each respective transforaminal space.  The patient was observed for 90 seconds post injection.  After no sensory deficits were reported, and normal lower extremity motor function was noted,   the above injectate was administered so that equal amounts of the injectate were placed at each foramen (level) into the transforaminal epidural space.   Additional Comments:  The patient tolerated the procedure well Dressing: 2 x 2 sterile gauze and Band-Aid    Post-procedure details: Patient was observed during the procedure. Post-procedure instructions were reviewed.  Patient left the clinic in stable condition.    Clinical History: Lumbosacral radiculopathy. Right hip pain. Back surgery 2005   EXAM: MRI LUMBAR SPINE WITHOUT CONTRAST   TECHNIQUE: Multiplanar, multisequence MR imaging of the lumbar spine was performed. No intravenous contrast was administered.   COMPARISON:  Lumbar radiographs 08/23/2017. No prior MRI for comparison.   FINDINGS: Segmentation:  Normal   Alignment:  Mild scoliosis.  Normal sagittal alignment   Vertebrae:  Negative for fracture or mass   Conus medullaris and cauda equina: Conus extends to the L1-2 level. Conus and cauda equina appear normal.   Paraspinal and other soft tissues: Bilateral renal cysts. No retroperitoneal mass or adenopathy.   Disc levels:   T11-12: Marked right foraminal encroachment due to asymmetric facet hypertrophy and disc bulging   T12-L1: Marked right foraminal encroachment due to  asymmetric disc degeneration and spurring on the right as well as bilateral facet hypertrophy.   L1-2: Moderate spinal stenosis. Marked disc degeneration and spondylosis. Moderate to severe facet degeneration bilaterally. Marked foraminal stenosis bilaterally.   L2-3: Mild spinal stenosis. Moderate disc degeneration and disc bulging, left greater than right. Moderate facet hypertrophy bilaterally. Marked left foraminal encroachment due to spurring   L3-4: Decompressive laminectomy. Spinal canal adequate in size. Moderate to advanced disc degeneration with diffuse endplate spurring. Moderate left foraminal encroachment.   L4-5: Posterior decompression. Mild spinal stenosis. Marked disc degeneration and spondylosis. Disc bulging and diffuse endplate spurring. Moderate facet hypertrophy. Marked foraminal encroachment bilaterally.   L5-S1 mild disc degeneration. Severe facet hypertrophy bilaterally with moderate subarticular stenosis bilaterally.   IMPRESSION: Advanced degenerative change throughout the lumbar spine. Extensive disc degeneration and spondylosis. Extensive facet degeneration. Multilevel spinal and foraminal stenosis throughout the lumbar spine as described above.     Electronically Signed   By: Franchot Gallo M.D.   On: 01/04/2018 06:49     Objective:  VS:  HT:    WT:   BMI:     BP:101/71  HR:(!) 106bpm  TEMP: ( )  RESP:  Physical Exam Vitals and nursing note reviewed.  Constitutional:      General:  She is not in acute distress.    Appearance: Normal appearance. She is not ill-appearing.  HENT:     Head: Normocephalic and atraumatic.     Right Ear: External ear normal.     Left Ear: External ear normal.  Eyes:     Extraocular Movements: Extraocular movements intact.  Cardiovascular:     Rate and Rhythm: Normal rate.     Pulses: Normal pulses.  Pulmonary:     Effort: Pulmonary effort is normal. No respiratory distress.  Abdominal:     General:  There is no distension.     Palpations: Abdomen is soft.  Musculoskeletal:        General: Tenderness present.     Cervical back: Neck supple.     Right lower leg: No edema.     Left lower leg: No edema.     Comments: Patient has good distal strength with no pain over the greater trochanters.  No clonus or focal weakness.  Skin:    Findings: No erythema, lesion or rash.  Neurological:     General: No focal deficit present.     Mental Status: She is alert and oriented to person, place, and time.     Sensory: No sensory deficit.     Motor: No weakness or abnormal muscle tone.     Coordination: Coordination normal.  Psychiatric:        Mood and Affect: Mood normal.        Behavior: Behavior normal.     Imaging: XR C-ARM NO REPORT  Result Date: 09/14/2020 Please see Notes tab for imaging impression.

## 2020-09-14 NOTE — Progress Notes (Signed)
Pt state lower back pain that travels to her right hip. Pt state sitting then having to take that first step makes the pain worse. Pt state she takes over the counter pain meds. Pt has hx of inj on 08/10/20 pt state it helped for two weeks.  Numeric Pain Rating Scale and Functional Assessment Average Pain 6   In the last MONTH (on 0-10 scale) has pain interfered with the following?  1. General activity like being  able to carry out your everyday physical activities such as walking, climbing stairs, carrying groceries, or moving a chair?  Rating(10)   +Driver, +BT, -Dye Allergies.

## 2020-09-15 ENCOUNTER — Other Ambulatory Visit: Payer: Self-pay | Admitting: Family

## 2020-09-15 NOTE — Procedures (Signed)
Lumbosacral Transforaminal Epidural Steroid Injection - Sub-Pedicular Approach with Fluoroscopic Guidance  Patient: Tamara Larson      Date of Birth: 1940/08/05 MRN: [id number] PCP: Lauree Chandler, NP      Visit Date: 09/14/2020   Universal Protocol:    Date/Time: 09/14/2020  Consent Given By: the patient  Position: PRONE  Additional Comments: Vital signs were monitored before and after the procedure. Patient was prepped and draped in the usual sterile fashion. The correct patient, procedure, and site was verified.   Injection Procedure Details:   Procedure diagnoses: Lumbar radiculopathy [M54.16]    Meds Administered:  Meds ordered this encounter  Medications   betamethasone acetate-betamethasone sodium phosphate (CELESTONE) injection 12 mg    Laterality: Right  Location/Site:  L5-S1  Needle:5.0 in., 22 ga.  Short bevel or Quincke spinal needle  Needle Placement: Transforaminal  Findings:    -Comments: Excellent flow of contrast along the nerve, nerve root and into the epidural space.  Procedure Details: After squaring off the end-plates to get a true AP view, the C-arm was positioned so that an oblique view of the foramen as noted above was visualized. The target area is just inferior to the "nose of the scotty dog" or sub pedicular. The soft tissues overlying this structure were infiltrated with 2-3 ml. of 1% Lidocaine without Epinephrine.  The spinal needle was inserted toward the target using a "trajectory" view along the fluoroscope beam.  Under AP and lateral visualization, the needle was advanced so it did not puncture dura and was located close the 6 O'Clock position of the pedical in AP tracterory. Biplanar projections were used to confirm position. Aspiration was confirmed to be negative for CSF and/or blood. A 1-2 ml. volume of Isovue-250 was injected and flow of contrast was noted at each level. Radiographs were obtained for documentation purposes.    After attaining the desired flow of contrast documented above, a 0.5 to 1.0 ml test dose of 0.25% Marcaine was injected into each respective transforaminal space.  The patient was observed for 90 seconds post injection.  After no sensory deficits were reported, and normal lower extremity motor function was noted,   the above injectate was administered so that equal amounts of the injectate were placed at each foramen (level) into the transforaminal epidural space.   Additional Comments:  The patient tolerated the procedure well Dressing: 2 x 2 sterile gauze and Band-Aid    Post-procedure details: Patient was observed during the procedure. Post-procedure instructions were reviewed.  Patient left the clinic in stable condition.

## 2020-10-19 ENCOUNTER — Other Ambulatory Visit: Payer: Self-pay | Admitting: Nurse Practitioner

## 2020-10-19 ENCOUNTER — Other Ambulatory Visit: Payer: Self-pay | Admitting: Cardiovascular Disease

## 2020-10-19 DIAGNOSIS — Z1231 Encounter for screening mammogram for malignant neoplasm of breast: Secondary | ICD-10-CM

## 2020-10-19 NOTE — Telephone Encounter (Signed)
Prescription refill request for Eliquis received. Indication:AFIB Last office visit:BERRY 08/19/20 Scr:1.36 08/24/20 Age: 20F Weight:89KG

## 2020-10-20 ENCOUNTER — Telehealth: Payer: Self-pay | Admitting: Cardiovascular Disease

## 2020-10-20 NOTE — Telephone Encounter (Signed)
Spoke with patient who stated that when she went to pick up her medication they told her it was $460. She stated she has never had to pay that. I spoke with person at pharmacy and they stated she can get at 30 supply of Eliquis for $91. Patient called her insurance and they verified it will be $91 for the rest of the year. Pt agreed with plan to pickup medication tomorrow and can pay the $91. Told to call our office is she experiences any other issues and to not go with out her medication. She agreed and not additional questions at this time.

## 2020-10-20 NOTE — Telephone Encounter (Signed)
Pt c/o medication issue:  1. Name of Medication: apixaban (ELIQUIS) 5 MG TABS tablet  2. How are you currently taking this medication (dosage and times per day)? 1 tablet twice a day  3. Are you having a reaction (difficulty breathing--STAT)? no  4. What is your medication issue? Patient states she went to pick up her prescription and it was over $400. She would like to know if she needs to stay on the medication and if so are there are samples.

## 2020-11-12 ENCOUNTER — Other Ambulatory Visit: Payer: Self-pay

## 2020-11-12 ENCOUNTER — Encounter: Payer: Self-pay | Admitting: Nurse Practitioner

## 2020-11-12 ENCOUNTER — Ambulatory Visit (INDEPENDENT_AMBULATORY_CARE_PROVIDER_SITE_OTHER): Payer: Medicare HMO | Admitting: Nurse Practitioner

## 2020-11-12 DIAGNOSIS — Z0000 Encounter for general adult medical examination without abnormal findings: Secondary | ICD-10-CM | POA: Diagnosis not present

## 2020-11-12 DIAGNOSIS — E2839 Other primary ovarian failure: Secondary | ICD-10-CM
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
## 2020-11-12 NOTE — Patient Instructions (Addendum)
Tamara Larson , Thank you for taking time to come for your Medicare Wellness Visit. I appreciate your ongoing commitment to your health goals. Please review the following plan we discussed and let me know if I can assist you in the future.   Screening recommendations/referrals: Colonoscopy aged out Mammogram -scheduled Bone Density ordered today- call and schedule Recommended yearly ophthalmology/optometry visit for glaucoma screening and checkup Recommended yearly dental visit for hygiene and checkup  Vaccinations: Influenza vaccine DUE at this time Pneumococcal vaccine- up to date Tdap vaccine RECOMMENDED- to get at local pharmacy Shingles vaccine RECOMMENDED- to get at local pharmacy    Advanced directives: on file.   Conditions/risks identified: hypertension, hyperlipidemia, advanced age.   Next appointment: 1 year for AWV   Preventive Care 48 Years and Older, Female Preventive care refers to lifestyle choices and visits with your health care provider that can promote health and wellness. What does preventive care include? A yearly physical exam. This is also called an annual well check. Dental exams once or twice a year. Routine eye exams. Ask your health care provider how often you should have your eyes checked. Personal lifestyle choices, including: Daily care of your teeth and gums. Regular physical activity. Eating a healthy diet. Avoiding tobacco and drug use. Limiting alcohol use. Practicing safe sex. Taking low-dose aspirin every day. Taking vitamin and mineral supplements as recommended by your health care provider. What happens during an annual well check? The services and screenings done by your health care provider during your annual well check will depend on your age, overall health, lifestyle risk factors, and family history of disease. Counseling  Your health care provider may ask you questions about your: Alcohol use. Tobacco use. Drug use. Emotional  well-being. Home and relationship well-being. Sexual activity. Eating habits. History of falls. Memory and ability to understand (cognition). Work and work Statistician. Reproductive health. Screening  You may have the following tests or measurements: Height, weight, and BMI. Blood pressure. Lipid and cholesterol levels. These may be checked every 5 years, or more frequently if you are over 99 years old. Skin check. Lung cancer screening. You may have this screening every year starting at age 42 if you have a 30-pack-year history of smoking and currently smoke or have quit within the past 15 years. Fecal occult blood test (FOBT) of the stool. You may have this test every year starting at age 28. Flexible sigmoidoscopy or colonoscopy. You may have a sigmoidoscopy every 5 years or a colonoscopy every 10 years starting at age 28. Hepatitis C blood test. Hepatitis B blood test. Sexually transmitted disease (STD) testing. Diabetes screening. This is done by checking your blood sugar (glucose) after you have not eaten for a while (fasting). You may have this done every 1-3 years. Bone density scan. This is done to screen for osteoporosis. You may have this done starting at age 22. Mammogram. This may be done every 1-2 years. Talk to your health care provider about how often you should have regular mammograms. Talk with your health care provider about your test results, treatment options, and if necessary, the need for more tests. Vaccines  Your health care provider may recommend certain vaccines, such as: Influenza vaccine. This is recommended every year. Tetanus, diphtheria, and acellular pertussis (Tdap, Td) vaccine. You may need a Td booster every 10 years. Zoster vaccine. You may need this after age 13. Pneumococcal 13-valent conjugate (PCV13) vaccine. One dose is recommended after age 23. Pneumococcal polysaccharide (PPSV23) vaccine. One  dose is recommended after age 47. Talk to your  health care provider about which screenings and vaccines you need and how often you need them. This information is not intended to replace advice given to you by your health care provider. Make sure you discuss any questions you have with your health care provider. Document Released: 03/13/2015 Document Revised: 11/04/2015 Document Reviewed: 12/16/2014 Elsevier Interactive Patient Education  2017 [place] Prevention in the Home Falls can cause injuries. They can happen to people of all ages. There are many things you can do to make your home safe and to help prevent falls. What can I do on the outside of my home? Regularly fix the edges of walkways and driveways and fix any cracks. Remove anything that might make you trip as you walk through a door, such as a raised step or threshold. Trim any bushes or trees on the path to your home. Use bright outdoor lighting. Clear any walking paths of anything that might make someone trip, such as rocks or tools. Regularly check to see if handrails are loose or broken. Make sure that both sides of any steps have handrails. Any raised decks and porches should have guardrails on the edges. Have any leaves, snow, or ice cleared regularly. Use sand or salt on walking paths during winter. Clean up any spills in your garage right away. This includes oil or grease spills. What can I do in the bathroom? Use night lights. Install grab bars by the toilet and in the tub and shower. Do not use towel bars as grab bars. Use non-skid mats or decals in the tub or shower. If you need to sit down in the shower, use a plastic, non-slip stool. Keep the floor dry. Clean up any water that spills on the floor as soon as it happens. Remove soap buildup in the tub or shower regularly. Attach bath mats securely with double-sided non-slip rug tape. Do not have throw rugs and other things on the floor that can make you trip. What can I do in the bedroom? Use night  lights. Make sure that you have a light by your bed that is easy to reach. Do not use any sheets or blankets that are too big for your bed. They should not hang down onto the floor. Have a firm chair that has side arms. You can use this for support while you get dressed. Do not have throw rugs and other things on the floor that can make you trip. What can I do in the kitchen? Clean up any spills right away. Avoid walking on wet floors. Keep items that you use a lot in easy-to-reach places. If you need to reach something above you, use a strong step stool that has a grab bar. Keep electrical cords out of the way. Do not use floor polish or wax that makes floors slippery. If you must use wax, use non-skid floor wax. Do not have throw rugs and other things on the floor that can make you trip. What can I do with my stairs? Do not leave any items on the stairs. Make sure that there are handrails on both sides of the stairs and use them. Fix handrails that are broken or loose. Make sure that handrails are as long as the stairways. Check any carpeting to make sure that it is firmly attached to the stairs. Fix any carpet that is loose or worn. Avoid having throw rugs at the top or bottom of the  stairs. If you do have throw rugs, attach them to the floor with carpet tape. Make sure that you have a light switch at the top of the stairs and the bottom of the stairs. If you do not have them, ask someone to add them for you. What else can I do to help prevent falls? Wear shoes that: Do not have high heels. Have rubber bottoms. Are comfortable and fit you well. Are closed at the toe. Do not wear sandals. If you use a stepladder: Make sure that it is fully opened. Do not climb a closed stepladder. Make sure that both sides of the stepladder are locked into place. Ask someone to hold it for you, if possible. Clearly mark and make sure that you can see: Any grab bars or handrails. First and last  steps. Where the edge of each step is. Use tools that help you move around (mobility aids) if they are needed. These include: Canes. Walkers. Scooters. Crutches. Turn on the lights when you go into a dark area. Replace any light bulbs as soon as they burn out. Set up your furniture so you have a clear path. Avoid moving your furniture around. If any of your floors are uneven, fix them. If there are any pets around you, be aware of where they are. Review your medicines with your doctor. Some medicines can make you feel dizzy. This can increase your chance of falling. Ask your doctor what other things that you can do to help prevent falls. This information is not intended to replace advice given to you by your health care provider. Make sure you discuss any questions you have with your health care provider. Document Released: 12/11/2008 Document Revised: 07/23/2015 Document Reviewed: 03/21/2014 Elsevier Interactive Patient Education  2017 [employer].  =

## 2020-11-12 NOTE — Progress Notes (Signed)
This service is provided via telemedicine  No vital signs collected/recorded due to the encounter was a telemedicine visit.   Location of patient (ex: home, work):  Home  Patient consents to a telephone visit:  Yes, see encounter dated 11/12/2020  Location of the provider (ex: office, home):  [place]  Name of any referring provider:  N/A  Names of all persons participating in the telemedicine service and their role in the encounter:  Sherrie Mustache, Nurse Practitioner, Carroll Kinds, CMA, and patient.   Time spent on call:  11 minutes with medical assistant

## 2020-11-17 ENCOUNTER — Other Ambulatory Visit: Payer: Self-pay | Admitting: Nurse Practitioner

## 2020-11-17 DIAGNOSIS — E039 Hypothyroidism, unspecified: Secondary | ICD-10-CM

## 2020-11-18 ENCOUNTER — Other Ambulatory Visit: Payer: Self-pay

## 2020-11-18 ENCOUNTER — Encounter: Payer: Self-pay | Admitting: Family

## 2020-11-18 ENCOUNTER — Ambulatory Visit (INDEPENDENT_AMBULATORY_CARE_PROVIDER_SITE_OTHER): Payer: Medicare HMO | Admitting: Family

## 2020-11-18 VITALS — BP 120/90 | HR 80 | Temp 96.6°F | Ht 67.0 in | Wt 198.2 lb

## 2020-11-18 DIAGNOSIS — R399 Unspecified symptoms and signs involving the genitourinary system: Secondary | ICD-10-CM

## 2020-11-18 LAB — POCT URINALYSIS DIPSTICK
Bilirubin, UA: NEGATIVE
Glucose, UA: NEGATIVE
Ketones, UA: NEGATIVE
Nitrite, UA: NEGATIVE
Protein, UA: POSITIVE — AB
Spec Grav, UA: <=1.005 — AB (ref 1.010–1.025)
Urobilinogen, UA: 0.2 E.U./dL
pH, UA: 7.5 (ref 5.0–8.0)

## 2020-11-18 MED ORDER — SACCHAROMYCES BOULARDII 250 MG PO CAPS: 250.0000 mg | ORAL_CAPSULE | Freq: Two times a day (BID) | ORAL | 0 refills | Status: AC

## 2020-11-18 MED ORDER — CIPROFLOXACIN HCL 500 MG PO TABS: 500.0000 mg | ORAL_TABLET | Freq: Two times a day (BID) | ORAL | 0 refills | Status: AC
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
## 2020-11-18 NOTE — Patient Instructions (Signed)
Start on Cipro 500 mg tablet one by mouth twice daily x 7 days Take along with probiotics Florastor 250 mg capsule one by mouth twice daily x 10 days to prevent antibiotics associated diarrhea   Urinary Tract Infection, Adult A urinary tract infection (UTI) is an infection of any part of the urinary tract. The urinary tract includes the kidneys, ureters, bladder, and urethra. These organs make, store, and get rid of urine in the body. An upper UTI affects the ureters and kidneys. A lower UTI affects the bladder and urethra. What are the causes? Most urinary tract infections are caused by bacteria in your genital area around your urethra, where urine leaves your body. These bacteria grow and cause inflammation of your urinary tract. What increases the risk? You are more likely to develop this condition if: You have a urinary catheter that stays in place. You are not able to control when you urinate or have a bowel movement (incontinence). You are female and you: Use a spermicide or diaphragm for birth control. Have low estrogen levels. Are pregnant. You have certain genes that increase your risk. You are sexually active. You take antibiotic medicines. You have a condition that causes your flow of urine to slow down, such as: An enlarged prostate, if you are female. Blockage in your urethra. A kidney stone. A nerve condition that affects your bladder control (neurogenic bladder). Not getting enough to drink, or not urinating often. You have certain medical conditions, such as: Diabetes. A weak disease-fighting system (immunesystem). Sickle cell disease. Gout. Spinal cord injury. What are the signs or symptoms? Symptoms of this condition include: Needing to urinate right away (urgency). Frequent urination. This may include small amounts of urine each time you urinate. Pain or burning with urination. Blood in the urine. Urine that smells bad or unusual. Trouble urinating. Cloudy  urine. Vaginal discharge, if you are female. Pain in the abdomen or the lower back. You may also have: Vomiting or a decreased appetite. Confusion. Irritability or tiredness. A fever or chills. Diarrhea. The first symptom in older adults may be confusion. In some cases, they may not have any symptoms until the infection has worsened. How is this diagnosed? This condition is diagnosed based on your medical history and a physical exam. You may also have other tests, including: Urine tests. Blood tests. Tests for STIs (sexually transmitted infections). If you have had more than one UTI, a cystoscopy or imaging studies may be done to determine the cause of the infections. How is this treated? Treatment for this condition includes: Antibiotic medicine. Over-the-counter medicines to treat discomfort. Drinking enough water to stay hydrated. If you have frequent infections or have other conditions such as a kidney stone, you may need to see a health care provider who specializes in the urinary tract (urologist). In rare cases, urinary tract infections can cause sepsis. Sepsis is a life-threatening condition that occurs when the body responds to an infection. Sepsis is treated in the hospital with IV antibiotics, fluids, and other medicines. Follow these instructions at home: Medicines Take over-the-counter and prescription medicines only as told by your health care provider. If you were prescribed an antibiotic medicine, take it as told by your health care provider. Do not stop using the antibiotic even if you start to feel better. General instructions Make sure you: Empty your bladder often and completely. Do not hold urine for long periods of time. Empty your bladder after sex. Wipe from front to back after urinating or having  a bowel movement if you are female. Use each tissue only one time when you wipe. Drink enough fluid to keep your urine pale yellow. Keep all follow-up visits. This  is important. Contact a health care provider if: Your symptoms do not get better after 1-2 days. Your symptoms go away and then return. Get help right away if: You have severe pain in your back or your lower abdomen. You have a fever or chills. You have nausea or vomiting. Summary A urinary tract infection (UTI) is an infection of any part of the urinary tract, which includes the kidneys, ureters, bladder, and urethra. Most urinary tract infections are caused by bacteria in your genital area. Treatment for this condition often includes antibiotic medicines. If you were prescribed an antibiotic medicine, take it as told by your health care provider. Do not stop using the antibiotic even if you start to feel better. Keep all follow-up visits. This is important. This information is not intended to replace advice given to you by your health care provider. Make sure you discuss any questions you have with your health care provider. Document Revised: 09/27/2019 Document Reviewed: 09/27/2019 Elsevier Patient Education  [place].

## 2020-11-19 ENCOUNTER — Telehealth: Payer: Self-pay

## 2020-11-19 NOTE — Telephone Encounter (Signed)
Late entry  Ms. Conry,you are scheduled for a virtual visit with your provider today.    Just as we do with appointments in the office, we must obtain your consent to participate.  Your consent will be active for this visit and any virtual visit you may have with one of our providers in the next 365 days.    If you have a MyChart account, I can also send a copy of this consent to you electronically.  All virtual visits are billed to your insurance company just like a traditional visit in the office.  As this is a virtual visit, video technology does not allow for your provider to perform a traditional examination.  This may limit your provider's ability to fully assess your condition.  If your provider identifies any concerns that need to be evaluated in person or the need to arrange testing such as labs, EKG, etc, we will make arrangements to do so.    Although advances in technology are sophisticated, we cannot ensure that it will always work on either your end or our end.  If the connection with a video visit is poor, we may have to switch to a telephone visit.  With either a video or telephone visit, we are not always able to ensure that we have a secure connection.   I need to obtain your verbal consent now.   Are you willing to proceed with your visit today?   MIREYA MEDITZ has provided verbal consent on 11/19/2020 for a virtual visit (video or telephone).   Carroll Kinds, [facility] 11/12/2020  3:45 PM

## 2020-11-20 LAB — URINE CULTURE
MICRO NUMBER:: 12407140
Result:: NO GROWTH
SPECIMEN QUALITY:: ADEQUATE

## 2020-11-25 ENCOUNTER — Telehealth: Payer: Self-pay

## 2020-11-25 LAB — HEPATIC FUNCTION PANEL
ALT: 12 IU/L (ref 0–32)
AST: 13 IU/L (ref 0–40)
Albumin: 4.3 g/dL (ref 3.7–4.7)
Alkaline Phosphatase: 94 IU/L (ref 44–121)
Bilirubin Total: 0.7 mg/dL (ref 0.0–1.2)
Bilirubin, Direct: 0.19 mg/dL (ref 0.00–0.40)
Total Protein: 6.6 g/dL (ref 6.0–8.5)

## 2020-11-25 LAB — LIPID PANEL
Chol/HDL Ratio: 2.3 ratio (ref 0.0–4.4)
Cholesterol, Total: 180 mg/dL (ref 100–199)
HDL: 78 mg/dL (ref >39)
LDL Chol Calc (NIH): 82 mg/dL (ref 0–99)
Triglycerides: 112 mg/dL (ref 0–149)
VLDL Cholesterol Cal: 20 mg/dL (ref 5–40)

## 2020-11-25 NOTE — Telephone Encounter (Signed)
Pt called and would like to get gel injections again with Dr. Sharol Given.  Her last injection was Synvisc One in 01/22/18

## 2020-11-26 NOTE — Telephone Encounter (Signed)
Noted  

## 2020-11-30 ENCOUNTER — Telehealth: Payer: Self-pay | Admitting: Radiology

## 2020-11-30 ENCOUNTER — Other Ambulatory Visit: Payer: Self-pay

## 2020-11-30 ENCOUNTER — Ambulatory Visit: Payer: Medicare HMO | Admitting: Orthopedic Surgery

## 2020-11-30 ENCOUNTER — Encounter: Payer: Self-pay | Admitting: Orthopedic Surgery

## 2020-11-30 ENCOUNTER — Ambulatory Visit
Admission: RE | Admit: 2020-11-30 | Discharge: 2020-11-30 | Disposition: A | Discharge status: Home / Self Care | Payer: Medicare HMO | Source: Ambulatory Visit | Attending: Nurse Practitioner | Admitting: Nurse Practitioner

## 2020-11-30 DIAGNOSIS — M1711 Unilateral primary osteoarthritis, right knee: Secondary | ICD-10-CM

## 2020-11-30 DIAGNOSIS — M25561 Pain in right knee: Secondary | ICD-10-CM

## 2020-11-30 DIAGNOSIS — G8929 Other chronic pain: Secondary | ICD-10-CM | POA: Diagnosis not present

## 2020-11-30 DIAGNOSIS — Z1231 Encounter for screening mammogram for malignant neoplasm of breast: Secondary | ICD-10-CM

## 2020-11-30 MED ORDER — METHYLPREDNISOLONE ACETATE 40 MG/ML IJ SUSP
40.0000 mg | INTRAMUSCULAR | Status: AC | PRN
Start: 2020-11-30 — End: 2020-11-30
  Administered 2020-11-30: 40 mg via INTRA_ARTICULAR

## 2020-11-30 MED ORDER — LIDOCAINE HCL (PF) 1 % IJ SOLN
5.0000 mL | INTRAMUSCULAR | Status: AC | PRN
Start: 2020-11-30 — End: 2020-11-30
  Administered 2020-11-30: 5 mL
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
## 2020-11-30 NOTE — Telephone Encounter (Signed)
Please obtain authorization for right knee gel injection.-duda Thanks.

## 2020-12-02 NOTE — Telephone Encounter (Signed)
Noted  

## 2020-12-11 ENCOUNTER — Telehealth: Payer: Self-pay

## 2020-12-11 ENCOUNTER — Other Ambulatory Visit: Payer: Self-pay | Admitting: Nurse Practitioner

## 2020-12-11 DIAGNOSIS — E785 Hyperlipidemia, unspecified: Secondary | ICD-10-CM

## 2020-12-11 NOTE — Telephone Encounter (Signed)
Left message for pt to call back  °

## 2020-12-11 NOTE — Telephone Encounter (Signed)
Patient is returning call.  °

## 2020-12-11 NOTE — Addendum Note (Signed)
Addended by: Beatrix Fetters on: 12/11/2020 04:41 PM   Modules accepted: Orders

## 2020-12-11 NOTE — Telephone Encounter (Addendum)
Spoke with pt regarding cholesterol levels. Per Dr. Gwenlyn Found would like pt's LDL to be below 70. It looks like pt's PCP increased her rosuvastatin to 40mg  daily at the beginning of October. Will get labs in 3 months to follow up on medication change. Pt verbalizes understanding. Lab orders placed.

## 2020-12-11 NOTE — Telephone Encounter (Signed)
-----   Message from Lorretta Harp, MD sent at 11/26/2020  9:16 AM EDT ----- Not at goal for secondary prevention on rosuvastatin 20 mg a day with LDL of 80.  Increase rosuvastatin to 40 mg a day and recheck FLP in 3 months

## 2021-01-08 ENCOUNTER — Telehealth: Payer: Self-pay

## 2021-01-08 NOTE — Telephone Encounter (Signed)
Please advise 

## 2021-01-08 NOTE — Telephone Encounter (Signed)
Pt called to check the status of her gel injection

## 2021-01-12 ENCOUNTER — Telehealth: Payer: Self-pay

## 2021-01-12 NOTE — Telephone Encounter (Signed)
Talked with patient and advised her that PA is required and once approved, I will give her a call to schedule.  Patient voiced that she understands.

## 2021-01-12 NOTE — Telephone Encounter (Signed)
VOB has been submitted for SYnviscOne, right knee. BV pending.

## 2021-01-15 ENCOUNTER — Telehealth: Payer: Self-pay

## 2021-01-15 NOTE — Telephone Encounter (Signed)
PA required for SynviscOne, right knee. Faxed completed PA form to [place] at [phone]. PA Pending

## 2021-01-18 ENCOUNTER — Telehealth: Payer: Self-pay

## 2021-01-18 NOTE — Telephone Encounter (Signed)
Called and left a Vm for patient to CB to schedule for gel injection with Dr. Sharol Given or Junie Panning.  Approved for SynviscOne, right knee. [place] Patient will be responsible for 20% OOP. Co-pay of $25.00 PA Approval# [id number] Valid 01/15/2021- 04/17/2021

## 2021-01-27 ENCOUNTER — Ambulatory Visit (INDEPENDENT_AMBULATORY_CARE_PROVIDER_SITE_OTHER): Payer: Medicare HMO | Admitting: Family

## 2021-01-27 ENCOUNTER — Encounter: Payer: Self-pay | Admitting: Family

## 2021-01-27 DIAGNOSIS — M1711 Unilateral primary osteoarthritis, right knee: Secondary | ICD-10-CM | POA: Diagnosis not present

## 2021-01-27 MED ORDER — HYLAN G-F 20 48 MG/6ML IX SOSY
48.0000 mg | PREFILLED_SYRINGE | INTRA_ARTICULAR | Status: AC | PRN
  Administered 2021-01-27: 48 mg via INTRA_ARTICULAR

## 2021-01-27 MED ORDER — LIDOCAINE HCL 1 % IJ SOLN
1.0000 mL | INTRAMUSCULAR | Status: AC | PRN
  Administered 2021-01-27: 1 mL
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
## 2021-02-03 ENCOUNTER — Other Ambulatory Visit: Payer: Self-pay | Admitting: Cardiovascular Disease

## 2021-02-08 ENCOUNTER — Other Ambulatory Visit: Payer: Self-pay | Admitting: Family

## 2021-02-08 ENCOUNTER — Other Ambulatory Visit: Payer: Self-pay | Admitting: Nurse Practitioner

## 2021-02-08 DIAGNOSIS — E785 Hyperlipidemia, unspecified: Secondary | ICD-10-CM

## 2021-02-09 ENCOUNTER — Other Ambulatory Visit: Payer: Self-pay | Admitting: Cardiovascular Disease

## 2021-02-24 ENCOUNTER — Ambulatory Visit: Payer: Medicare HMO | Admitting: Nurse Practitioner

## 2021-02-26 ENCOUNTER — Other Ambulatory Visit: Payer: Self-pay

## 2021-02-26 ENCOUNTER — Ambulatory Visit (INDEPENDENT_AMBULATORY_CARE_PROVIDER_SITE_OTHER): Payer: Medicare HMO | Admitting: Nurse Practitioner

## 2021-02-26 ENCOUNTER — Encounter: Payer: Self-pay | Admitting: Nurse Practitioner

## 2021-02-26 VITALS — BP 132/84 | HR 85 | Temp 96.6°F | Ht 67.0 in | Wt 197.0 lb

## 2021-02-26 DIAGNOSIS — E785 Hyperlipidemia, unspecified: Secondary | ICD-10-CM | POA: Diagnosis not present

## 2021-02-26 DIAGNOSIS — E876 Hypokalemia: Secondary | ICD-10-CM | POA: Diagnosis not present

## 2021-02-26 DIAGNOSIS — I2510 Atherosclerotic heart disease of native coronary artery without angina pectoris: Secondary | ICD-10-CM | POA: Diagnosis not present

## 2021-02-26 DIAGNOSIS — I10 Essential (primary) hypertension: Secondary | ICD-10-CM

## 2021-02-26 DIAGNOSIS — Z9861 Coronary angioplasty status: Secondary | ICD-10-CM

## 2021-02-26 DIAGNOSIS — E6609 Other obesity due to excess calories: Secondary | ICD-10-CM

## 2021-02-26 DIAGNOSIS — I480 Paroxysmal atrial fibrillation: Secondary | ICD-10-CM

## 2021-02-26 DIAGNOSIS — M170 Bilateral primary osteoarthritis of knee: Secondary | ICD-10-CM

## 2021-02-26 DIAGNOSIS — E034 Atrophy of thyroid (acquired): Secondary | ICD-10-CM | POA: Diagnosis not present

## 2021-02-26 DIAGNOSIS — K219 Gastro-esophageal reflux disease without esophagitis: Secondary | ICD-10-CM

## 2021-02-26 DIAGNOSIS — M5416 Radiculopathy, lumbar region: Secondary | ICD-10-CM

## 2021-02-26 DIAGNOSIS — Z6830 Body mass index (BMI) 30.0-30.9, adult: Secondary | ICD-10-CM

## 2021-02-26 NOTE — Patient Instructions (Signed)
Shingles vaccine at local pharmacy TDAP vaccine at local pharmacy
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
## 2021-02-27 LAB — CBC WITH DIFFERENTIAL/PLATELET
Absolute Monocytes: 859 cells/uL (ref 200–950)
Basophils Absolute: 79 cells/uL (ref 0–200)
Basophils Relative: 0.7 %
Eosinophils Absolute: 181 cells/uL (ref 15–500)
Eosinophils Relative: 1.6 %
HCT: 35.5 % (ref 35.0–45.0)
Hemoglobin: 11.1 g/dL — ABNORMAL LOW (ref 11.7–15.5)
Lymphs Abs: 3096 cells/uL (ref 850–3900)
MCH: 29.3 pg (ref 27.0–33.0)
MCHC: 31.3 g/dL — ABNORMAL LOW (ref 32.0–36.0)
MCV: 93.7 fL (ref 80.0–100.0)
MPV: 10.8 fL (ref 7.5–12.5)
Monocytes Relative: 7.6 %
Neutro Abs: 7085 cells/uL (ref 1500–7800)
Neutrophils Relative %: 62.7 %
Platelets: 247 10*3/uL (ref 140–400)
RBC: 3.79 10*6/uL — ABNORMAL LOW (ref 3.80–5.10)
RDW: 14.4 % (ref 11.0–15.0)
Total Lymphocyte: 27.4 %
WBC: 11.3 10*3/uL — ABNORMAL HIGH (ref 3.8–10.8)

## 2021-02-27 LAB — COMPLETE METABOLIC PANEL WITH GFR
AG Ratio: 1.6 (calc) (ref 1.0–2.5)
ALT: 13 U/L (ref 6–29)
AST: 13 U/L (ref 10–35)
Albumin: 4.1 g/dL (ref 3.6–5.1)
Alkaline phosphatase (APISO): 77 U/L (ref 37–153)
BUN/Creatinine Ratio: 13 (calc) (ref 6–22)
BUN: 24 mg/dL (ref 7–25)
CO2: 25 mmol/L (ref 20–32)
Calcium: 9.4 mg/dL (ref 8.6–10.4)
Chloride: 108 mmol/L (ref 98–110)
Creat: 1.79 mg/dL — ABNORMAL HIGH (ref 0.60–1.00)
Globulin: 2.5 g/dL (calc) (ref 1.9–3.7)
Glucose, Bld: 102 mg/dL — ABNORMAL HIGH (ref 65–99)
Potassium: 3.1 mmol/L — ABNORMAL LOW (ref 3.5–5.3)
Sodium: 143 mmol/L (ref 135–146)
Total Bilirubin: 0.8 mg/dL (ref 0.2–1.2)
Total Protein: 6.6 g/dL (ref 6.1–8.1)
eGFR: 28 mL/min/{1.73_m2} — ABNORMAL LOW (ref >=60)

## 2021-02-27 LAB — TSH: TSH: 2.95 mIU/L (ref 0.40–4.50)

## 2021-03-02 ENCOUNTER — Telehealth: Payer: Self-pay

## 2021-03-02 DIAGNOSIS — E876 Hypokalemia: Secondary | ICD-10-CM

## 2021-03-02 MED ORDER — POTASSIUM CHLORIDE CRYS ER 20 MEQ PO TBCR: EXTENDED_RELEASE_TABLET | ORAL | 0 refills | Status: DC

## 2021-03-02 NOTE — Telephone Encounter (Signed)
Incoming call received from patient stating she was returning someone's call. Patient states she is already on potassium and it should show on her current medication list. Patient states Dr.Berry (cardiologist) recommended Potassium OTC 1 month ago.  Patient is taking Potassium Gluconate 550 mg once daily x 1 month. Patient questions is you would still like for her to take potassium that you recommended.  Send response to [facility] Clinical Pool

## 2021-03-02 NOTE — Telephone Encounter (Signed)
Called and spoke with patient. Understands instructions. Rx sent to pharmacy and lab scheduled for 03/10/2021. Order placed.

## 2021-03-02 NOTE — Telephone Encounter (Signed)
Yes once daily after that Lab work to confirm potassium level stable.

## 2021-03-02 NOTE — Telephone Encounter (Addendum)
Please confirm directions "2 tablets the first day and then daily after that." One daily after that? To Joelene Millin, NP  ----- Message from Lauree Chandler, NP sent at 02/27/2021 11:10 AM EST ----- Thyroid level normal range. Kidney function has worsened. Encourage proper hydration and to avoid NSAIDS (Aleve, Advil, Motrin, Ibuprofen).  Potassium level is low. Recommend to start prescription pharmacy potassium 20 meq by mouth daily. Recommended to take 2 tablets the first day and then daily after that. To have her follow up bmp in office in 1 week after starting medication to make sure potassium has improved.

## 2021-03-02 NOTE — Telephone Encounter (Signed)
Yes to stop OTC potassium supplement and start potassium medication

## 2021-03-02 NOTE — Telephone Encounter (Signed)
Called and LMOM to return call.  

## 2021-03-03 ENCOUNTER — Other Ambulatory Visit: Payer: Self-pay

## 2021-03-03 DIAGNOSIS — E785 Hyperlipidemia, unspecified: Secondary | ICD-10-CM

## 2021-03-08 ENCOUNTER — Other Ambulatory Visit: Payer: Self-pay | Admitting: Family

## 2021-03-08 NOTE — Telephone Encounter (Signed)
Has had several refills please advise.

## 2021-03-10 ENCOUNTER — Other Ambulatory Visit: Payer: Self-pay

## 2021-03-10 ENCOUNTER — Other Ambulatory Visit: Payer: Medicare HMO

## 2021-03-10 DIAGNOSIS — E876 Hypokalemia: Secondary | ICD-10-CM

## 2021-03-10 LAB — HEPATIC FUNCTION PANEL
ALT: 14 IU/L (ref 0–32)
AST: 16 IU/L (ref 0–40)
Albumin: 4.4 g/dL (ref 3.7–4.7)
Alkaline Phosphatase: 100 IU/L (ref 44–121)
Bilirubin Total: 0.8 mg/dL (ref 0.0–1.2)
Bilirubin, Direct: 0.22 mg/dL (ref 0.00–0.40)
Total Protein: 6.7 g/dL (ref 6.0–8.5)

## 2021-03-10 LAB — BASIC METABOLIC PANEL WITH GFR
BUN/Creatinine Ratio: 11 (calc) (ref 6–22)
BUN: 16 mg/dL (ref 7–25)
CO2: 26 mmol/L (ref 20–32)
Calcium: 9.6 mg/dL (ref 8.6–10.4)
Chloride: 108 mmol/L (ref 98–110)
Creat: 1.42 mg/dL — ABNORMAL HIGH (ref 0.60–1.00)
Glucose, Bld: 111 mg/dL — ABNORMAL HIGH (ref 65–99)
Potassium: 3.7 mmol/L (ref 3.5–5.3)
Sodium: 142 mmol/L (ref 135–146)
eGFR: 38 mL/min/{1.73_m2} — ABNORMAL LOW (ref >=60)

## 2021-03-10 LAB — LIPID PANEL
Chol/HDL Ratio: 2.3 ratio (ref 0.0–4.4)
Cholesterol, Total: 175 mg/dL (ref 100–199)
HDL: 76 mg/dL (ref >39)
LDL Chol Calc (NIH): 79 mg/dL (ref 0–99)
Triglycerides: 114 mg/dL (ref 0–149)
VLDL Cholesterol Cal: 20 mg/dL (ref 5–40)

## 2021-03-11 ENCOUNTER — Encounter: Payer: Self-pay | Admitting: Cardiovascular Disease

## 2021-03-24 ENCOUNTER — Other Ambulatory Visit: Payer: Self-pay | Admitting: Nurse Practitioner

## 2021-03-25 ENCOUNTER — Other Ambulatory Visit: Payer: Self-pay | Admitting: Cardiovascular Disease

## 2021-03-25 NOTE — Telephone Encounter (Signed)
Prescription refill request for Eliquis received. Indication:Afib Last office visit:6/22 Scr:1.4 Age: 81 Weight:89.4 kg  Prescription refilled

## 2021-04-27 ENCOUNTER — Ambulatory Visit
Admission: RE | Admit: 2021-04-27 | Discharge: 2021-04-27 | Disposition: A | Discharge status: Home / Self Care | Payer: Medicare HMO | Source: Ambulatory Visit | Attending: Nurse Practitioner | Admitting: Nurse Practitioner

## 2021-04-27 DIAGNOSIS — E2839 Other primary ovarian failure: Secondary | ICD-10-CM

## 2021-05-18 ENCOUNTER — Other Ambulatory Visit: Payer: Self-pay | Admitting: Nurse Practitioner

## 2021-05-18 DIAGNOSIS — E039 Hypothyroidism, unspecified: Secondary | ICD-10-CM

## 2021-06-21 ENCOUNTER — Other Ambulatory Visit: Payer: Self-pay | Admitting: Nurse Practitioner

## 2021-08-06 ENCOUNTER — Other Ambulatory Visit: Payer: Self-pay | Admitting: Nurse Practitioner

## 2021-08-06 ENCOUNTER — Other Ambulatory Visit: Payer: Self-pay | Admitting: Family

## 2021-08-06 DIAGNOSIS — E785 Hyperlipidemia, unspecified: Secondary | ICD-10-CM

## 2021-09-03 ENCOUNTER — Ambulatory Visit (INDEPENDENT_AMBULATORY_CARE_PROVIDER_SITE_OTHER): Payer: Medicare HMO | Admitting: Nurse Practitioner

## 2021-09-03 ENCOUNTER — Encounter: Payer: Self-pay | Admitting: Nurse Practitioner

## 2021-09-03 VITALS — BP 122/78 | HR 80 | Temp 96.8°F | Ht 67.0 in | Wt 195.0 lb

## 2021-09-03 DIAGNOSIS — I10 Essential (primary) hypertension: Secondary | ICD-10-CM

## 2021-09-03 DIAGNOSIS — E034 Atrophy of thyroid (acquired): Secondary | ICD-10-CM

## 2021-09-03 DIAGNOSIS — I2510 Atherosclerotic heart disease of native coronary artery without angina pectoris: Secondary | ICD-10-CM

## 2021-09-03 DIAGNOSIS — Z9861 Coronary angioplasty status: Secondary | ICD-10-CM

## 2021-09-03 DIAGNOSIS — K219 Gastro-esophageal reflux disease without esophagitis: Secondary | ICD-10-CM

## 2021-09-03 DIAGNOSIS — D6869 Other thrombophilia: Secondary | ICD-10-CM | POA: Diagnosis not present

## 2021-09-03 DIAGNOSIS — E785 Hyperlipidemia, unspecified: Secondary | ICD-10-CM

## 2021-09-03 DIAGNOSIS — I480 Paroxysmal atrial fibrillation: Secondary | ICD-10-CM | POA: Diagnosis not present

## 2021-09-03 DIAGNOSIS — M5416 Radiculopathy, lumbar region: Secondary | ICD-10-CM
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
## 2021-09-04 LAB — COMPLETE METABOLIC PANEL WITH GFR
AG Ratio: 1.7 (calc) (ref 1.0–2.5)
ALT: 16 U/L (ref 6–29)
AST: 12 U/L (ref 10–35)
Albumin: 3.8 g/dL (ref 3.6–5.1)
Alkaline phosphatase (APISO): 74 U/L (ref 37–153)
BUN/Creatinine Ratio: 12 (calc) (ref 6–22)
BUN: 22 mg/dL (ref 7–25)
CO2: 24 mmol/L (ref 20–32)
Calcium: 9.2 mg/dL (ref 8.6–10.4)
Chloride: 111 mmol/L — ABNORMAL HIGH (ref 98–110)
Creat: 1.89 mg/dL — ABNORMAL HIGH (ref 0.60–1.00)
Globulin: 2.3 g/dL (calc) (ref 1.9–3.7)
Glucose, Bld: 107 mg/dL (ref 65–139)
Potassium: 3.6 mmol/L (ref 3.5–5.3)
Sodium: 142 mmol/L (ref 135–146)
Total Bilirubin: 0.8 mg/dL (ref 0.2–1.2)
Total Protein: 6.1 g/dL (ref 6.1–8.1)
eGFR: 27 mL/min/{1.73_m2} — ABNORMAL LOW (ref >=60)

## 2021-09-04 LAB — CBC WITH DIFFERENTIAL/PLATELET
Absolute Monocytes: 739 cells/uL (ref 200–950)
Basophils Absolute: 66 cells/uL (ref 0–200)
Basophils Relative: 0.8 %
Eosinophils Absolute: 158 cells/uL (ref 15–500)
Eosinophils Relative: 1.9 %
HCT: 35.8 % (ref 35.0–45.0)
Hemoglobin: 11.6 g/dL — ABNORMAL LOW (ref 11.7–15.5)
Lymphs Abs: 2283 cells/uL (ref 850–3900)
MCH: 30.1 pg (ref 27.0–33.0)
MCHC: 32.4 g/dL (ref 32.0–36.0)
MCV: 93 fL (ref 80.0–100.0)
MPV: 10.9 fL (ref 7.5–12.5)
Monocytes Relative: 8.9 %
Neutro Abs: 5055 cells/uL (ref 1500–7800)
Neutrophils Relative %: 60.9 %
Platelets: 252 10*3/uL (ref 140–400)
RBC: 3.85 10*6/uL (ref 3.80–5.10)
RDW: 14.2 % (ref 11.0–15.0)
Total Lymphocyte: 27.5 %
WBC: 8.3 10*3/uL (ref 3.8–10.8)

## 2021-09-08 ENCOUNTER — Encounter: Payer: Self-pay | Admitting: Family

## 2021-09-08 ENCOUNTER — Ambulatory Visit: Payer: Medicare HMO | Admitting: Family

## 2021-09-08 DIAGNOSIS — M1711 Unilateral primary osteoarthritis, right knee: Secondary | ICD-10-CM | POA: Diagnosis not present

## 2021-09-08 MED ORDER — LIDOCAINE HCL 1 % IJ SOLN
5.0000 mL | INTRAMUSCULAR | Status: AC | PRN
  Administered 2021-09-08: 5 mL

## 2021-09-08 MED ORDER — METHYLPREDNISOLONE ACETATE 40 MG/ML IJ SUSP
40.0000 mg | INTRAMUSCULAR | Status: AC | PRN
  Administered 2021-09-08: 40 mg via INTRA_ARTICULAR
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
## 2021-09-21 ENCOUNTER — Other Ambulatory Visit: Payer: Self-pay | Admitting: Family

## 2021-09-21 ENCOUNTER — Other Ambulatory Visit: Payer: Self-pay | Admitting: Cardiovascular Disease

## 2021-09-21 DIAGNOSIS — I480 Paroxysmal atrial fibrillation: Secondary | ICD-10-CM

## 2021-09-22 NOTE — Telephone Encounter (Signed)
Prescription refill request for Eliquis received. Indication: a fib Last office visit:overdue Age: 81 Weight: 88kg

## 2021-10-22 ENCOUNTER — Other Ambulatory Visit: Payer: Self-pay | Admitting: Nurse Practitioner

## 2021-10-22 DIAGNOSIS — Z1231 Encounter for screening mammogram for malignant neoplasm of breast: Secondary | ICD-10-CM

## 2021-10-25 ENCOUNTER — Telehealth: Payer: Self-pay | Admitting: Cardiovascular Disease

## 2021-10-25 ENCOUNTER — Other Ambulatory Visit: Payer: Self-pay | Admitting: Cardiovascular Disease

## 2021-10-25 DIAGNOSIS — I480 Paroxysmal atrial fibrillation: Secondary | ICD-10-CM

## 2021-10-25 NOTE — Telephone Encounter (Signed)
Duplicate eliquis refill request. Sent from previous encounter.

## 2021-10-25 NOTE — Telephone Encounter (Addendum)
Eliquis '5mg'$  refill request received. Patient is 81 years old, weight-88.5kg, Crea-1.89 on 09/03/2021, Diagnosis-Afib, and last seen by Dr. Gwenlyn Found on 08/19/2020-PT NEEDS AN APPT. Dose is appropriate based on dosing criteria.    PT NEEDS AN APPT; WILL SEND MSG TO SCHEDULERS. Pt has an appt on 12/17/2021 with Dr. Gwenlyn Found

## 2021-10-25 NOTE — Telephone Encounter (Signed)
*  STAT* If patient is at the pharmacy, call can be transferred to refill team.   1. Which medications need to be refilled? (please list name of each medication and dose if known)   apixaban (ELIQUIS) 5 MG TABS tablet  2. Which pharmacy/location (including street and city if local pharmacy) is medication to be sent to?  [id number]- [place], [place] - [address] AT [address]. Do they need a 30 day or 90 day supply?   30 day  Patient stated she will run out of this medication on Wednesday.   Patient has appointment scheduled on 12/17/21.

## 2021-11-11 ENCOUNTER — Other Ambulatory Visit: Payer: Self-pay | Admitting: Nurse Practitioner

## 2021-11-11 DIAGNOSIS — E039 Hypothyroidism, unspecified: Secondary | ICD-10-CM

## 2021-11-25 ENCOUNTER — Other Ambulatory Visit: Payer: Self-pay | Admitting: Family

## 2021-12-01 ENCOUNTER — Ambulatory Visit: Payer: Medicare HMO

## 2021-12-03 ENCOUNTER — Ambulatory Visit
Admission: RE | Admit: 2021-12-03 | Discharge: 2021-12-03 | Disposition: A | Discharge status: Home / Self Care | Payer: Medicare HMO | Source: Ambulatory Visit | Attending: Nurse Practitioner | Admitting: Nurse Practitioner

## 2021-12-03 DIAGNOSIS — Z1231 Encounter for screening mammogram for malignant neoplasm of breast: Secondary | ICD-10-CM

## 2021-12-17 ENCOUNTER — Ambulatory Visit: Payer: Medicare HMO | Attending: Cardiovascular Disease | Admitting: Cardiovascular Disease

## 2021-12-17 ENCOUNTER — Encounter: Payer: Self-pay | Admitting: Cardiovascular Disease

## 2021-12-17 DIAGNOSIS — E785 Hyperlipidemia, unspecified: Secondary | ICD-10-CM

## 2021-12-17 DIAGNOSIS — Z9861 Coronary angioplasty status: Secondary | ICD-10-CM

## 2021-12-17 DIAGNOSIS — I480 Paroxysmal atrial fibrillation: Secondary | ICD-10-CM

## 2021-12-17 DIAGNOSIS — I2510 Atherosclerotic heart disease of native coronary artery without angina pectoris: Secondary | ICD-10-CM | POA: Diagnosis not present

## 2021-12-17 DIAGNOSIS — I10 Essential (primary) hypertension: Secondary | ICD-10-CM | POA: Diagnosis not present

## 2021-12-17 MED ORDER — METOPROLOL TARTRATE 25 MG PO TABS: 25.0000 mg | ORAL_TABLET | Freq: Two times a day (BID) | ORAL | 3 refills | Status: DC

## 2021-12-17 NOTE — Assessment & Plan Note (Signed)
History of essential hypertension blood pressure measured today at 118/70.  She is on amlodipine, losartan and metoprolol.

## 2021-12-17 NOTE — Progress Notes (Signed)
12/17/2021 Tamara Larson   [birth year]  [id number]  Primary Physician Lauree Chandler, NP Primary Cardiologist: Lorretta Harp MD [place] Car[place], [place]  HPI:  Tamara Larson is a 81 y.o.  moderately overweight widowed African-American female mother of one child away in 2018 from cancer, grandmother of 3 grandchildren and great grandmother to 3 great-grandchildren referred by Dr. Amalia Hailey for peripheral vascular evaluation because of absent pedal pulses on exam.  I last saw her in the office 08/19/2020.  She has a history of treated hypertension and hyperlipidemia. She was apparently seen for ingrown toenails and apparently feel pulses were not able to be felt although she denies claudication. She had Dopplers that showed normal ABIs. He she does get some mild swelling in her legs on occasion which improved with compression stockings   She was recently seen in the emergency room for atypical chest pain thought to be related to reflux and was treated with a antacid which has improved her symptoms.  She also had a 2D echocardiogram performed 07/28/2017 which was essentially normal except for some calcified mitral leaflets without evidence of stenosis or regurgitation.  She was scheduled to have a coronary CTA but this could not be done because of her heart rate.  She therefore was set up to have an outpatient cath which I performed on 09/24/2018 via the right radial approach revealing high-grade proximal LAD and mid circumflex stenosis which I stented using drug-eluting stents.  She had a 50% mid RCA lesion which was treated medically.  She was on triple therapy for a month after which she discontinued aspirin.  Should be noted that she had an event monitor performed 09/06/2018 that did show PAF for which she was placed on Eliquis oral anticoagulation and low-dose plate beta-blockade.   She was admitted to the hospital 11/18/2018 for 1 day because of a witnessed syncopal episode at home.  On  presentation to the hospital she was found to be in A. fib with RVR, essentially converting to sinus rhythm.  Work-up in the hospitalization otherwise was unrevealing.  She denies chest pain or shortness of breath.  She was found to be hyperthyroid with a TSH measured at 0.258.  Her Synthroid dose was down titrated.  It is possible that this was contributing to her atrial fibrillation.   Since I saw her in the office over a year ago she continues to do well.  She denies chest pain or shortness of breath.   Current Meds  Medication Sig   acetaminophen (TYLENOL) 650 MG CR tablet Take 650 mg by mouth every 8 (eight) hours as needed for pain.   amLODipine (NORVASC) 10 MG tablet TAKE 1 TABLET (10 MG TOTAL) BY MOUTH DAILY. NEEDS APPT FOR ADDITIONAL REFILLS   apixaban (ELIQUIS) 5 MG TABS tablet TAKE 1 TABLET BY MOUTH TWICE A DAY   aspirin EC 81 MG tablet Take 81 mg by mouth daily. Swallow whole.   levothyroxine (SYNTHROID) 75 MCG tablet TAKE 1 TABLET BY MOUTH EVERY DAY BEFORE BREAKFAST   losartan (COZAAR) 100 MG tablet TAKE 1 TABLET BY MOUTH EVERY DAY   metoprolol tartrate (LOPRESSOR) 25 MG tablet TAKE 0.5 TABLETS 2 TIMES DAILY. YOU CAN TAKE A EXTRA 12.'5MG'$  ONCE A DAY AS NEEDED FOR PALPITATIONS   Multiple Vitamin (MULTIVITAMIN WITH MINERALS) TABS tablet Take 1 tablet by mouth daily. Centrum Silver   nitroGLYCERIN (NITROSTAT) 0.4 MG SL tablet Place 1 tablet (0.4 mg total) under the tongue every 5 (  five) minutes as needed for chest pain.   pantoprazole (PROTONIX) 40 MG tablet TAKE 1 TABLET BY MOUTH EVERY DAY   potassium chloride SA (KLOR-CON M20) 20 MEQ tablet Take 1 tablet (20 mEq total) by mouth daily. TAKE TWO TABLETS THE FIRST DAY AND THEN ONE TABLET BY MOUTH DAILY.   rosuvastatin (CRESTOR) 40 MG tablet TAKE 1 TABLET BY MOUTH EVERY DAY     Allergies  Allergen Reactions   Shellfish Allergy Swelling    Social History   Socioeconomic History   Marital status: Widowed    Spouse name: Not on file    Number of children: Not on file   Years of education: Not on file   Highest education level: Not on file  Occupational History   Not on file  Tobacco Use   Smoking status: Former    Packs/day: 0.00    Types: Cigarettes   Smokeless tobacco: Never   Tobacco comments:    Quit about age 55   Vaping Use   Vaping Use: Never used  Substance and Sexual Activity   Alcohol use: No    Alcohol/week: 0.0 standard drinks of alcohol   Drug use: No   Sexual activity: Never    Birth control/protection: Post-menopausal, Surgical    Comment: 1st intercourse 81 yo-Fewer than 5 partners  Other Topics Concern   Not on file  Social History Narrative   Do you drink/eat things with caffeine? Yes, coffee   Was married x 26 years, widow   Lives in a one stories house, one person, no pets   Past/Current profession- Sales executive   Patient exercises daily    Social Determinants of Health   Financial Resource Strain: Not on file  Food Insecurity: Not on file  Transportation Needs: Not on file  Physical Activity: Not on file  Stress: Not on file  Social Connections: Not on file  Intimate Partner Violence: Not on file     Review of Systems: General: negative for chills, fever, night sweats or weight changes.  Cardiovascular: negative for chest pain, dyspnea on exertion, edema, orthopnea, palpitations, paroxysmal nocturnal dyspnea or shortness of breath Dermatological: negative for rash Respiratory: negative for cough or wheezing Urologic: negative for hematuria Abdominal: negative for nausea, vomiting, diarrhea, bright red blood per rectum, melena, or hematemesis Neurologic: negative for visual changes, syncope, or dizziness All other systems reviewed and are otherwise negative except as noted above.    Blood pressure 118/70, pulse (!) 102, height '5\' 7"'$  (1.702 m), weight 195 lb 12.8 oz (88.8 kg), SpO2 97 %.  General appearance: alert and no distress Neck: no adenopathy, no carotid  bruit, no JVD, supple, symmetrical, trachea midline, and thyroid not enlarged, symmetric, no tenderness/mass/nodules Lungs: clear to auscultation bilaterally Heart: irregularly irregular rhythm Extremities: extremities normal, atraumatic, no cyanosis or edema Pulses: 2+ and symmetric Skin: Skin color, texture, turgor normal. No rashes or lesions Neurologic: Grossly normal  EKG atrial fibrillation with a ventricular sponsor 102.  I personally reviewed this EKG.  ASSESSMENT AND PLAN:   Dyslipidemia, goal LDL below 70 History of dyslipidemia on high-dose Crestor with lipid profile performed 03/10/2021 revealing a total cholesterol of 175, LDL 79 and HDL of 76.  Essential hypertension History of essential hypertension blood pressure measured today at 118/70.  She is on amlodipine, losartan and metoprolol.  CAD S/P PCI History of CAD status post outpatient radial diagnostic cath which I performed 09/24/2018 revealing a high-grade proximal LAD and mid circumflex stenosis both of which  I stented with drug-eluting stents.  She did have a 50% mid RCA lesion which I treated medically.  She denies chest pain.  She remains on aspirin.  Paroxysmal atrial fibrillation (HCC) History of PAF on Eliquis oral antral heart elation.  She is in A-fib today with a heart rate of 102.  She is on metoprolol 12.5 mg p.o. twice daily.  I am going to increase this to 25 mg p.o. p.o. twice daily for rate control.     Lorretta Harp MD FACP,FACC,FAHA, [facility] 12/17/2021 11:24 AM

## 2021-12-17 NOTE — Assessment & Plan Note (Signed)
History of CAD status post outpatient radial diagnostic cath which I performed 09/24/2018 revealing a high-grade proximal LAD and mid circumflex stenosis both of which I stented with drug-eluting stents.  She did have a 50% mid RCA lesion which I treated medically.  She denies chest pain.  She remains on aspirin.

## 2021-12-17 NOTE — Assessment & Plan Note (Signed)
History of PAF on Eliquis oral antral heart elation.  She is in A-fib today with a heart rate of 102.  She is on metoprolol 12.5 mg p.o. twice daily.  I am going to increase this to 25 mg p.o. p.o. twice daily for rate control.

## 2021-12-17 NOTE — Patient Instructions (Signed)
Medication Instructions:  Your physician has recommended you make the following change in your medication:   -Increase metoprolol tartrate (lopressor) to '25mg'$  twice daily.   *If you need a refill on your cardiac medications before your next appointment, please call your pharmacy*   Follow-Up: At [facility], you and your health needs are our priority.  As part of our continuing mission to provide you with exceptional heart care, we have created designated Provider Care Teams.  These Care Teams include your primary Cardiologist (physician) and Advanced Practice Providers (APPs -  Physician Assistants and Nurse Practitioners) who all work together to provide you with the care you need, when you need it.  We recommend signing up for the patient portal called "MyChart".  Sign up information is provided on this After Visit Summary.  MyChart is used to connect with patients for Virtual Visits (Telemedicine).  Patients are able to view lab/test results, encounter notes, upcoming appointments, etc.  Non-urgent messages can be sent to your provider as well.   To learn more about what you can do with MyChart, go to [email or web address].    Your next appointment:   6 month(s)  The format for your next appointment:   In Person  Provider:   Fabian Sharp, PA-C, Sande Rives, PA-C, Jory Sims, DNP, ANP, Almyra Deforest, PA-C, or Diona Browner, NP      Then, Quay Burow, MD will plan to see you again in 12 month(s). Other

## 2021-12-17 NOTE — Assessment & Plan Note (Signed)
History of dyslipidemia on high-dose Crestor with lipid profile performed 03/10/2021 revealing a total cholesterol of 175, LDL 79 and HDL of 76.

## 2021-12-24 ENCOUNTER — Encounter: Payer: Self-pay | Admitting: Nurse Practitioner

## 2021-12-24 ENCOUNTER — Ambulatory Visit (INDEPENDENT_AMBULATORY_CARE_PROVIDER_SITE_OTHER): Payer: Medicare HMO | Admitting: Nurse Practitioner

## 2021-12-24 ENCOUNTER — Other Ambulatory Visit: Payer: Self-pay

## 2021-12-24 DIAGNOSIS — Z0000 Encounter for general adult medical examination without abnormal findings: Secondary | ICD-10-CM

## 2021-12-24 NOTE — Progress Notes (Signed)
This service is provided via telemedicine  No vital signs collected/recorded due to the encounter was a telemedicine visit.   Location of patient (ex: home, work):  home  Patient consents to a telephone visit:  yes  Location of the provider (ex: office, home):  home  Name of any referring provider:  Lauree Chandler, NP   Names of all persons participating in the telemedicine service and their role in the encounter:  Sanjuana Kava CMA and Bernerd Pho  Time spent on call:  9 minus

## 2021-12-24 NOTE — Patient Instructions (Signed)
Tamara Larson , Thank you for taking time to come for your Medicare Wellness Visit. I appreciate your ongoing commitment to your health goals. Please review the following plan we discussed and let me know if I can assist you in the future.   Screening recommendations/referrals: Colonoscopy aged out Mammogram aged out Bone Density up to date Recommended yearly ophthalmology/optometry visit for glaucoma screening and checkup Recommended yearly dental visit for hygiene and checkup  Vaccinations: Influenza vaccine- due annually in September/October Pneumococcal vaccine up to date Tdap vaccine DUE- recommend to get at your local pharmacy  Shingles vaccine DUE- recommend to get at your local pharmacy    Advanced directives: on file.   Conditions/risks identified: advanced age, obesity  Next appointment: yearly    Preventive Care 14 Years and Older, Female Preventive care refers to lifestyle choices and visits with your health care provider that can promote health and wellness. What does preventive care include? A yearly physical exam. This is also called an annual well check. Dental exams once or twice a year. Routine eye exams. Ask your health care provider how often you should have your eyes checked. Personal lifestyle choices, including: Daily care of your teeth and gums. Regular physical activity. Eating a healthy diet. Avoiding tobacco and drug use. Limiting alcohol use. Practicing safe sex. Taking low-dose aspirin every day. Taking vitamin and mineral supplements as recommended by your health care provider. What happens during an annual well check? The services and screenings done by your health care provider during your annual well check will depend on your age, overall health, lifestyle risk factors, and family history of disease. Counseling  Your health care provider may ask you questions about your: Alcohol use. Tobacco use. Drug use. Emotional well-being. Home and  relationship well-being. Sexual activity. Eating habits. History of falls. Memory and ability to understand (cognition). Work and work Statistician. Reproductive health. Screening  You may have the following tests or measurements: Height, weight, and BMI. Blood pressure. Lipid and cholesterol levels. These may be checked every 5 years, or more frequently if you are over 33 years old. Skin check. Lung cancer screening. You may have this screening every year starting at age 47 if you have a 30-pack-year history of smoking and currently smoke or have quit within the past 15 years. Fecal occult blood test (FOBT) of the stool. You may have this test every year starting at age 32. Flexible sigmoidoscopy or colonoscopy. You may have a sigmoidoscopy every 5 years or a colonoscopy every 10 years starting at age 4. Hepatitis C blood test. Hepatitis B blood test. Sexually transmitted disease (STD) testing. Diabetes screening. This is done by checking your blood sugar (glucose) after you have not eaten for a while (fasting). You may have this done every 1-3 years. Bone density scan. This is done to screen for osteoporosis. You may have this done starting at age 8. Mammogram. This may be done every 1-2 years. Talk to your health care provider about how often you should have regular mammograms. Talk with your health care provider about your test results, treatment options, and if necessary, the need for more tests. Vaccines  Your health care provider may recommend certain vaccines, such as: Influenza vaccine. This is recommended every year. Tetanus, diphtheria, and acellular pertussis (Tdap, Td) vaccine. You may need a Td booster every 10 years. Zoster vaccine. You may need this after age 6. Pneumococcal 13-valent conjugate (PCV13) vaccine. One dose is recommended after age 42. Pneumococcal polysaccharide (PPSV23) vaccine. One  dose is recommended after age 61. Talk to your health care provider  about which screenings and vaccines you need and how often you need them. This information is not intended to replace advice given to you by your health care provider. Make sure you discuss any questions you have with your health care provider. Document Released: 03/13/2015 Document Revised: 11/04/2015 Document Reviewed: 12/16/2014 Elsevier Interactive Patient Education  2017 [employer] Prevention in the Home Falls can cause injuries. They can happen to people of all ages. There are many things you can do to make your home safe and to help prevent falls. What can I do on the outside of my home? Regularly fix the edges of walkways and driveways and fix any cracks. Remove anything that might make you trip as you walk through a door, such as a raised step or threshold. Trim any bushes or trees on the path to your home. Use bright outdoor lighting. Clear any walking paths of anything that might make someone trip, such as rocks or tools. Regularly check to see if handrails are loose or broken. Make sure that both sides of any steps have handrails. Any raised decks and porches should have guardrails on the edges. Have any leaves, snow, or ice cleared regularly. Use sand or salt on walking paths during winter. Clean up any spills in your garage right away. This includes oil or grease spills. What can I do in the bathroom? Use night lights. Install grab bars by the toilet and in the tub and shower. Do not use towel bars as grab bars. Use non-skid mats or decals in the tub or shower. If you need to sit down in the shower, use a plastic, non-slip stool. Keep the floor dry. Clean up any water that spills on the floor as soon as it happens. Remove soap buildup in the tub or shower regularly. Attach bath mats securely with double-sided non-slip rug tape. Do not have throw rugs and other things on the floor that can make you trip. What can I do in the bedroom? Use night lights. Make sure  that you have a light by your bed that is easy to reach. Do not use any sheets or blankets that are too big for your bed. They should not hang down onto the floor. Have a firm chair that has side arms. You can use this for support while you get dressed. Do not have throw rugs and other things on the floor that can make you trip. What can I do in the kitchen? Clean up any spills right away. Avoid walking on wet floors. Keep items that you use a lot in easy-to-reach places. If you need to reach something above you, use a strong step stool that has a grab bar. Keep electrical cords out of the way. Do not use floor polish or wax that makes floors slippery. If you must use wax, use non-skid floor wax. Do not have throw rugs and other things on the floor that can make you trip. What can I do with my stairs? Do not leave any items on the stairs. Make sure that there are handrails on both sides of the stairs and use them. Fix handrails that are broken or loose. Make sure that handrails are as long as the stairways. Check any carpeting to make sure that it is firmly attached to the stairs. Fix any carpet that is loose or worn. Avoid having throw rugs at the top or bottom of the  stairs. If you do have throw rugs, attach them to the floor with carpet tape. Make sure that you have a light switch at the top of the stairs and the bottom of the stairs. If you do not have them, ask someone to add them for you. What else can I do to help prevent falls? Wear shoes that: Do not have high heels. Have rubber bottoms. Are comfortable and fit you well. Are closed at the toe. Do not wear sandals. If you use a stepladder: Make sure that it is fully opened. Do not climb a closed stepladder. Make sure that both sides of the stepladder are locked into place. Ask someone to hold it for you, if possible. Clearly mark and make sure that you can see: Any grab bars or handrails. First and last steps. Where the edge of  each step is. Use tools that help you move around (mobility aids) if they are needed. These include: Canes. Walkers. Scooters. Crutches. Turn on the lights when you go into a dark area. Replace any light bulbs as soon as they burn out. Set up your furniture so you have a clear path. Avoid moving your furniture around. If any of your floors are uneven, fix them. If there are any pets around you, be aware of where they are. Review your medicines with your doctor. Some medicines can make you feel dizzy. This can increase your chance of falling. Ask your doctor what other things that you can do to help prevent falls. This information is not intended to replace advice given to you by your health care provider. Make sure you discuss any questions you have with your health care provider. Document Released: 12/11/2008 Document Revised: 07/23/2015 Document Reviewed: 03/21/2014 Elsevier Interactive Patient Education  2017 [employer].
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
## 2022-01-12 ENCOUNTER — Telehealth: Payer: Self-pay | Admitting: *Deleted

## 2022-01-12 NOTE — Telephone Encounter (Signed)
Patient hit her shoulder on a door knob as she was standing up.She asked if we give cortisone shots here.  If not what can she do to ease the pain? Please advise.  I told her I would ask and get back to her. She called her orthopedic he can't see her till 11/21.  Patient's # [phone]   Thanks,  Thaily Hackworth S/CMA

## 2022-01-12 NOTE — Telephone Encounter (Signed)
Dr Sabra Heck may do cortisone shot but you would have to verify with him.

## 2022-01-13 ENCOUNTER — Ambulatory Visit: Payer: Medicare HMO | Admitting: Orthopedic Surgery

## 2022-01-13 ENCOUNTER — Ambulatory Visit (INDEPENDENT_AMBULATORY_CARE_PROVIDER_SITE_OTHER): Payer: Medicare HMO

## 2022-01-13 DIAGNOSIS — M25512 Pain in left shoulder: Secondary | ICD-10-CM | POA: Diagnosis not present

## 2022-01-31 ENCOUNTER — Encounter: Payer: Self-pay | Admitting: Orthopedic Surgery

## 2022-01-31 DIAGNOSIS — M25512 Pain in left shoulder: Secondary | ICD-10-CM | POA: Diagnosis not present

## 2022-01-31 MED ORDER — LIDOCAINE HCL 1 % IJ SOLN
5.0000 mL | INTRAMUSCULAR | Status: AC | PRN
  Administered 2022-01-31: 5 mL

## 2022-01-31 MED ORDER — METHYLPREDNISOLONE ACETATE 40 MG/ML IJ SUSP
40.0000 mg | INTRAMUSCULAR | Status: AC | PRN
  Administered 2022-01-31: 40 mg via INTRA_ARTICULAR
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
## 2022-02-02 ENCOUNTER — Other Ambulatory Visit: Payer: Self-pay | Admitting: Medical

## 2022-02-06 ENCOUNTER — Other Ambulatory Visit: Payer: Self-pay | Admitting: Nurse Practitioner

## 2022-02-06 ENCOUNTER — Other Ambulatory Visit: Payer: Self-pay | Admitting: Medical

## 2022-02-06 DIAGNOSIS — E785 Hyperlipidemia, unspecified: Secondary | ICD-10-CM

## 2022-02-06 DIAGNOSIS — E039 Hypothyroidism, unspecified: Secondary | ICD-10-CM

## 2022-02-10 ENCOUNTER — Ambulatory Visit (INDEPENDENT_AMBULATORY_CARE_PROVIDER_SITE_OTHER): Payer: Medicare HMO | Admitting: Orthopedic Surgery

## 2022-02-10 ENCOUNTER — Encounter: Payer: Self-pay | Admitting: Orthopedic Surgery

## 2022-02-10 DIAGNOSIS — M75112 Incomplete rotator cuff tear or rupture of left shoulder, not specified as traumatic: Secondary | ICD-10-CM

## 2022-02-10 DIAGNOSIS — S46112D Strain of muscle, fascia and tendon of long head of biceps, left arm, subsequent encounter: Secondary | ICD-10-CM | POA: Diagnosis not present
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
## 2022-02-28 ENCOUNTER — Other Ambulatory Visit: Payer: Self-pay | Admitting: Medical

## 2022-02-28 ENCOUNTER — Other Ambulatory Visit: Payer: Self-pay | Admitting: Family

## 2022-03-04 ENCOUNTER — Encounter: Payer: Self-pay | Admitting: Nurse Practitioner

## 2022-03-07 ENCOUNTER — Encounter: Payer: Self-pay | Admitting: Nurse Practitioner

## 2022-03-07 ENCOUNTER — Ambulatory Visit (INDEPENDENT_AMBULATORY_CARE_PROVIDER_SITE_OTHER): Payer: Medicare HMO | Admitting: Nurse Practitioner

## 2022-03-07 VITALS — BP 130/68 | HR 100 | Temp 97.8°F | Ht 67.0 in | Wt 191.0 lb

## 2022-03-07 DIAGNOSIS — D6869 Other thrombophilia: Secondary | ICD-10-CM | POA: Diagnosis not present

## 2022-03-07 DIAGNOSIS — E785 Hyperlipidemia, unspecified: Secondary | ICD-10-CM | POA: Diagnosis not present

## 2022-03-07 DIAGNOSIS — I2510 Atherosclerotic heart disease of native coronary artery without angina pectoris: Secondary | ICD-10-CM

## 2022-03-07 DIAGNOSIS — I480 Paroxysmal atrial fibrillation: Secondary | ICD-10-CM | POA: Diagnosis not present

## 2022-03-07 DIAGNOSIS — E034 Atrophy of thyroid (acquired): Secondary | ICD-10-CM | POA: Diagnosis not present

## 2022-03-07 DIAGNOSIS — I10 Essential (primary) hypertension: Secondary | ICD-10-CM

## 2022-03-07 DIAGNOSIS — E876 Hypokalemia: Secondary | ICD-10-CM

## 2022-03-07 DIAGNOSIS — K219 Gastro-esophageal reflux disease without esophagitis: Secondary | ICD-10-CM

## 2022-03-07 DIAGNOSIS — Z9861 Coronary angioplasty status: Secondary | ICD-10-CM

## 2022-03-07 MED ORDER — LEVOTHYROXINE SODIUM 75 MCG PO TABS: ORAL_TABLET | ORAL | 3 refills | Status: DC

## 2022-03-07 MED ORDER — POTASSIUM CHLORIDE CRYS ER 20 MEQ PO TBCR: 20.0000 meq | EXTENDED_RELEASE_TABLET | Freq: Every day | ORAL | 3 refills | Status: DC

## 2022-03-07 MED ORDER — LOSARTAN POTASSIUM 100 MG PO TABS: 100.0000 mg | ORAL_TABLET | Freq: Every day | ORAL | 3 refills | Status: DC
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
## 2022-03-07 NOTE — Patient Instructions (Signed)
Tdap and shingles at local pharmacy

## 2022-03-08 ENCOUNTER — Other Ambulatory Visit: Payer: Self-pay

## 2022-03-08 DIAGNOSIS — D72829 Elevated white blood cell count, unspecified: Secondary | ICD-10-CM

## 2022-03-08 LAB — CBC WITH DIFFERENTIAL/PLATELET
Absolute Monocytes: 953 cells/uL — ABNORMAL HIGH (ref 200–950)
Basophils Absolute: 51 cells/uL (ref 0–200)
Basophils Relative: 0.4 %
Eosinophils Absolute: 89 cells/uL (ref 15–500)
Eosinophils Relative: 0.7 %
HCT: 32.8 % — ABNORMAL LOW (ref 35.0–45.0)
Hemoglobin: 10.6 g/dL — ABNORMAL LOW (ref 11.7–15.5)
Lymphs Abs: 1829 cells/uL (ref 850–3900)
MCH: 31.1 pg (ref 27.0–33.0)
MCHC: 32.3 g/dL (ref 32.0–36.0)
MCV: 96.2 fL (ref 80.0–100.0)
MPV: 11.6 fL (ref 7.5–12.5)
Monocytes Relative: 7.5 %
Neutro Abs: 9779 cells/uL — ABNORMAL HIGH (ref 1500–7800)
Neutrophils Relative %: 77 %
Platelets: 220 10*3/uL (ref 140–400)
RBC: 3.41 10*6/uL — ABNORMAL LOW (ref 3.80–5.10)
RDW: 13.3 % (ref 11.0–15.0)
Total Lymphocyte: 14.4 %
WBC: 12.7 10*3/uL — ABNORMAL HIGH (ref 3.8–10.8)

## 2022-03-08 LAB — COMPLETE METABOLIC PANEL WITH GFR
AG Ratio: 1.5 (calc) (ref 1.0–2.5)
ALT: 28 U/L (ref 6–29)
AST: 11 U/L (ref 10–35)
Albumin: 3.7 g/dL (ref 3.6–5.1)
Alkaline phosphatase (APISO): 63 U/L (ref 37–153)
BUN/Creatinine Ratio: 11 (calc) (ref 6–22)
BUN: 21 mg/dL (ref 7–25)
CO2: 23 mmol/L (ref 20–32)
Calcium: 9.1 mg/dL (ref 8.6–10.4)
Chloride: 110 mmol/L (ref 98–110)
Creat: 1.83 mg/dL — ABNORMAL HIGH (ref 0.60–0.95)
Globulin: 2.4 g/dL (calc) (ref 1.9–3.7)
Glucose, Bld: 94 mg/dL (ref 65–99)
Potassium: 3.9 mmol/L (ref 3.5–5.3)
Sodium: 144 mmol/L (ref 135–146)
Total Bilirubin: 1.2 mg/dL (ref 0.2–1.2)
Total Protein: 6.1 g/dL (ref 6.1–8.1)
eGFR: 28 mL/min/{1.73_m2} — ABNORMAL LOW (ref >=60)

## 2022-03-08 LAB — LIPID PANEL
Cholesterol: 149 mg/dL (ref <200)
HDL: 73 mg/dL (ref >=50)
LDL Cholesterol (Calc): 58 mg/dL (calc)
Non-HDL Cholesterol (Calc): 76 mg/dL (calc) (ref <130)
Total CHOL/HDL Ratio: 2 (calc) (ref <5.0)
Triglycerides: 98 mg/dL (ref <150)

## 2022-03-08 LAB — TSH: TSH: 2.55 mIU/L (ref 0.40–4.50)

## 2022-03-13 ENCOUNTER — Ambulatory Visit
Admission: RE | Admit: 2022-03-13 | Discharge: 2022-03-13 | Disposition: A | Discharge status: Home / Self Care | Payer: Medicare HMO | Source: Ambulatory Visit | Attending: Orthopedic Surgery | Admitting: Orthopedic Surgery

## 2022-03-13 DIAGNOSIS — M75112 Incomplete rotator cuff tear or rupture of left shoulder, not specified as traumatic: Secondary | ICD-10-CM

## 2022-03-13 DIAGNOSIS — S46112D Strain of muscle, fascia and tendon of long head of biceps, left arm, subsequent encounter: Secondary | ICD-10-CM

## 2022-03-16 ENCOUNTER — Other Ambulatory Visit: Payer: Medicare HMO

## 2022-03-16 LAB — CBC
HCT: 31.7 % — ABNORMAL LOW (ref 35.0–45.0)
Hemoglobin: 10.2 g/dL — ABNORMAL LOW (ref 11.7–15.5)
MCH: 30.8 pg (ref 27.0–33.0)
MCHC: 32.2 g/dL (ref 32.0–36.0)
MCV: 95.8 fL (ref 80.0–100.0)
MPV: 10.3 fL (ref 7.5–12.5)
Platelets: 347 10*3/uL (ref 140–400)
RBC: 3.31 10*6/uL — ABNORMAL LOW (ref 3.80–5.10)
RDW: 13.4 % (ref 11.0–15.0)
WBC: 9 10*3/uL (ref 3.8–10.8)

## 2022-03-17 ENCOUNTER — Other Ambulatory Visit: Payer: Self-pay | Admitting: Nurse Practitioner

## 2022-03-29 ENCOUNTER — Ambulatory Visit: Payer: Medicare HMO | Admitting: Orthopedic Surgery

## 2022-03-29 DIAGNOSIS — M75112 Incomplete rotator cuff tear or rupture of left shoulder, not specified as traumatic: Secondary | ICD-10-CM

## 2022-03-29 DIAGNOSIS — M1711 Unilateral primary osteoarthritis, right knee: Secondary | ICD-10-CM | POA: Diagnosis not present

## 2022-03-30 ENCOUNTER — Other Ambulatory Visit: Payer: Self-pay | Admitting: Cardiovascular Disease

## 2022-03-31 ENCOUNTER — Encounter: Payer: Self-pay | Admitting: Orthopedic Surgery

## 2022-03-31 DIAGNOSIS — M1711 Unilateral primary osteoarthritis, right knee: Secondary | ICD-10-CM | POA: Diagnosis not present

## 2022-03-31 MED ORDER — LIDOCAINE HCL (PF) 1 % IJ SOLN
5.0000 mL | INTRAMUSCULAR | Status: AC | PRN
  Administered 2022-03-31: 5 mL

## 2022-03-31 MED ORDER — METHYLPREDNISOLONE ACETATE 40 MG/ML IJ SUSP
40.0000 mg | INTRAMUSCULAR | Status: AC | PRN
  Administered 2022-03-31: 40 mg via INTRA_ARTICULAR
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
## 2022-04-26 ENCOUNTER — Ambulatory Visit: Payer: Medicare HMO | Admitting: Orthopedic Surgery

## 2022-04-30 ENCOUNTER — Other Ambulatory Visit: Payer: Self-pay | Admitting: Nurse Practitioner

## 2022-04-30 ENCOUNTER — Other Ambulatory Visit: Payer: Self-pay | Admitting: Orthopedic Surgery

## 2022-04-30 ENCOUNTER — Other Ambulatory Visit: Payer: Medicare HMO | Admitting: Cardiovascular Disease

## 2022-04-30 DIAGNOSIS — I480 Paroxysmal atrial fibrillation: Secondary | ICD-10-CM

## 2022-04-30 DIAGNOSIS — E785 Hyperlipidemia, unspecified: Secondary | ICD-10-CM

## 2022-05-02 NOTE — Telephone Encounter (Signed)
Prescription refill request for Eliquis received. Indication: PAF Last office visit: 12/17/21  Adora Fridge MD Scr: 1.83 on 03/07/22 Age: 82 Weight: 88.8kg  Based on above findings Eliquis dose should be reduced to 2.'5mg'$  twice daily  Sent message to PharmD for advise on dose reduction.

## 2022-05-02 NOTE — Telephone Encounter (Signed)
Agree with dose reduction to Eliquis 2.'5mg'$  BID for afib indication based on age of 38 and SCr > 1.5

## 2022-05-02 NOTE — Telephone Encounter (Signed)
Tried to contact pt.  No answer.  LM on cell phone and house phone about dose reduction.  Also ask pharmacist to make pt aware.

## 2022-06-02 ENCOUNTER — Other Ambulatory Visit: Payer: Self-pay | Admitting: Family

## 2022-07-18 ENCOUNTER — Ambulatory Visit (INDEPENDENT_AMBULATORY_CARE_PROVIDER_SITE_OTHER): Payer: Medicare HMO | Admitting: Nurse Practitioner

## 2022-07-18 ENCOUNTER — Encounter: Payer: Self-pay | Admitting: Nurse Practitioner

## 2022-07-18 VITALS — BP 134/78 | HR 82 | Temp 97.8°F | Ht 67.0 in | Wt 187.0 lb

## 2022-07-18 DIAGNOSIS — K1379 Other lesions of oral mucosa: Secondary | ICD-10-CM

## 2022-07-18 DIAGNOSIS — W19XXXA Unspecified fall, initial encounter: Secondary | ICD-10-CM | POA: Diagnosis not present

## 2022-07-18 DIAGNOSIS — T148XXA Other injury of unspecified body region, initial encounter: Secondary | ICD-10-CM | POA: Diagnosis not present

## 2022-07-18 MED ORDER — AMOXICILLIN-POT CLAVULANATE 875-125 MG PO TABS: 1.0000 | ORAL_TABLET | Freq: Two times a day (BID) | ORAL | 0 refills | Status: DC

## 2022-07-18 NOTE — Patient Instructions (Signed)
To use warm compress to left check- three times daily  You will take antibiotic twice daily for 10 days.  Take with food and use probiotic/yogurt for gut health  Call if symptoms worsen, redness, fever, warmth occur
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
## 2022-07-19 ENCOUNTER — Ambulatory Visit: Payer: Medicare HMO | Attending: Physician Assistant | Admitting: Physician Assistant

## 2022-07-19 ENCOUNTER — Encounter: Payer: Self-pay | Admitting: Physician Assistant

## 2022-07-19 VITALS — BP 132/78 | HR 125 | Ht 67.0 in | Wt 185.8 lb

## 2022-07-19 DIAGNOSIS — I4819 Other persistent atrial fibrillation: Secondary | ICD-10-CM | POA: Diagnosis not present

## 2022-07-19 DIAGNOSIS — I10 Essential (primary) hypertension: Secondary | ICD-10-CM | POA: Diagnosis not present

## 2022-07-19 DIAGNOSIS — E785 Hyperlipidemia, unspecified: Secondary | ICD-10-CM

## 2022-07-19 MED ORDER — METOPROLOL TARTRATE 50 MG PO TABS: 50.0000 mg | ORAL_TABLET | Freq: Two times a day (BID) | ORAL | 3 refills | Status: DC

## 2022-07-19 MED ORDER — AMLODIPINE BESYLATE 5 MG PO TABS: 5.0000 mg | ORAL_TABLET | Freq: Every day | ORAL | 3 refills | Status: DC
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
## 2022-07-19 NOTE — Patient Instructions (Addendum)
Medication Instructions:   DECREASE Amlodipine to 5 mg daily INCREASE Metoprolol Tartrate (Lopressor) to 50 mg 2 times a day.  *If you need a refill on your cardiac medications before your next appointment, please call your pharmacy*  Lab Work: NONE ordered at this time of appointment   If you have labs (blood work) drawn today and your tests are completely normal, you will receive your results only by: MyChart Message (if you have MyChart) OR A paper copy in the mail If you have any lab test that is abnormal or we need to change your treatment, we will call you to review the results.  Testing/Procedures: Your physician has requested that you have an echocardiogram. Echocardiography is a painless test that uses sound waves to create images of your heart. It provides your doctor with information about the size and shape of your heart and how well your heart's chambers and valves are working. This procedure takes approximately one hour. There are no restrictions for this procedure. Please do NOT wear cologne, perfume, aftershave, or lotions (deodorant is allowed). Please arrive 15 minutes prior to your appointment time.  Follow-Up: At [facility], you and your health needs are our priority.  As part of our continuing mission to provide you with exceptional heart care, we have created designated Provider Care Teams.  These Care Teams include your primary Cardiologist (physician) and Advanced Practice Providers (APPs -  Physician Assistants and Nurse Practitioners) who all work together to provide you with the care you need, when you need it.  We recommend signing up for the patient portal called "MyChart".  Sign up information is provided on this After Visit Summary.  MyChart is used to connect with patients for Virtual Visits (Telemedicine).  Patients are able to view lab/test results, encounter notes, upcoming appointments, etc.  Non-urgent messages can be sent to your provider as  well.   To learn more about what you can do with MyChart, go to [email or web address].    Your next appointment:   6 week(s)  Provider:   Azalee Course, PA-C or APP        Other Instructions You have been referred to Afib clinic

## 2022-07-21 ENCOUNTER — Encounter: Payer: Self-pay | Admitting: Physician Assistant

## 2022-08-02 ENCOUNTER — Ambulatory Visit (HOSPITAL_COMMUNITY)
Admission: RE | Admit: 2022-08-02 | Discharge: 2022-08-02 | Disposition: A | Discharge status: Home / Self Care | Payer: Medicare HMO | Source: Ambulatory Visit | Attending: Physician Assistant | Admitting: Physician Assistant

## 2022-08-02 VITALS — BP 128/80 | HR 97 | Ht 67.0 in | Wt 186.0 lb

## 2022-08-02 DIAGNOSIS — I10 Essential (primary) hypertension: Secondary | ICD-10-CM | POA: Diagnosis not present

## 2022-08-02 DIAGNOSIS — Z79899 Other long term (current) drug therapy: Secondary | ICD-10-CM | POA: Insufficient documentation

## 2022-08-02 DIAGNOSIS — E039 Hypothyroidism, unspecified: Secondary | ICD-10-CM | POA: Insufficient documentation

## 2022-08-02 DIAGNOSIS — D6869 Other thrombophilia: Secondary | ICD-10-CM | POA: Insufficient documentation

## 2022-08-02 DIAGNOSIS — I4821 Permanent atrial fibrillation: Secondary | ICD-10-CM

## 2022-08-02 DIAGNOSIS — E785 Hyperlipidemia, unspecified: Secondary | ICD-10-CM | POA: Insufficient documentation

## 2022-08-02 DIAGNOSIS — I2510 Atherosclerotic heart disease of native coronary artery without angina pectoris: Secondary | ICD-10-CM | POA: Insufficient documentation

## 2022-08-02 DIAGNOSIS — I480 Paroxysmal atrial fibrillation: Secondary | ICD-10-CM | POA: Diagnosis not present

## 2022-08-02 DIAGNOSIS — I4811 Longstanding persistent atrial fibrillation: Secondary | ICD-10-CM | POA: Diagnosis present

## 2022-08-02 LAB — CBC
HCT: 33.9 % — ABNORMAL LOW (ref 36.0–46.0)
Hemoglobin: 10.3 g/dL — ABNORMAL LOW (ref 12.0–15.0)
MCH: 29.2 pg (ref 26.0–34.0)
MCHC: 30.4 g/dL (ref 30.0–36.0)
MCV: 96 fL (ref 80.0–100.0)
Platelets: 289 10*3/uL (ref 150–400)
RBC: 3.53 MIL/uL — ABNORMAL LOW (ref 3.87–5.11)
RDW: 17.3 % — ABNORMAL HIGH (ref 11.5–15.5)
WBC: 8.3 10*3/uL (ref 4.0–10.5)
nRBC: 0 % (ref 0.0–0.2)

## 2022-08-02 MED ORDER — METOPROLOL TARTRATE 50 MG PO TABS: 75.0000 mg | ORAL_TABLET | Freq: Two times a day (BID) | ORAL | 3 refills | Status: DC

## 2022-08-02 NOTE — Patient Instructions (Signed)
Increase metoprolol to 75mg twice a day 
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
## 2022-08-23 ENCOUNTER — Ambulatory Visit (HOSPITAL_COMMUNITY): Payer: Medicare HMO | Attending: Cardiovascular Disease

## 2022-08-23 DIAGNOSIS — I081 Rheumatic disorders of both mitral and tricuspid valves: Secondary | ICD-10-CM

## 2022-08-23 DIAGNOSIS — I4891 Unspecified atrial fibrillation: Secondary | ICD-10-CM

## 2022-08-23 DIAGNOSIS — I4819 Other persistent atrial fibrillation: Secondary | ICD-10-CM | POA: Insufficient documentation

## 2022-08-23 DIAGNOSIS — I517 Cardiomegaly: Secondary | ICD-10-CM

## 2022-08-23 LAB — ECHOCARDIOGRAM COMPLETE
MV M vel: 4.86 m/s
MV Peak grad: 94.5 mmHg
S' Lateral: 2.8 cm

## 2022-08-30 ENCOUNTER — Encounter: Payer: Self-pay | Admitting: Physician Assistant

## 2022-08-30 ENCOUNTER — Ambulatory Visit: Payer: Medicare HMO | Attending: Physician Assistant | Admitting: Physician Assistant

## 2022-08-30 ENCOUNTER — Other Ambulatory Visit: Payer: Self-pay | Admitting: Family

## 2022-08-30 VITALS — BP 130/84 | HR 86 | Ht 67.0 in | Wt 186.2 lb

## 2022-08-30 DIAGNOSIS — N183 Chronic kidney disease, stage 3 unspecified: Secondary | ICD-10-CM

## 2022-08-30 DIAGNOSIS — I480 Paroxysmal atrial fibrillation: Secondary | ICD-10-CM

## 2022-08-30 DIAGNOSIS — I10 Essential (primary) hypertension: Secondary | ICD-10-CM

## 2022-08-30 DIAGNOSIS — I4819 Other persistent atrial fibrillation: Secondary | ICD-10-CM | POA: Diagnosis not present

## 2022-08-30 DIAGNOSIS — M7989 Other specified soft tissue disorders: Secondary | ICD-10-CM

## 2022-08-30 DIAGNOSIS — I2510 Atherosclerotic heart disease of native coronary artery without angina pectoris: Secondary | ICD-10-CM

## 2022-08-30 MED ORDER — FUROSEMIDE 20 MG PO TABS: 20.0000 mg | ORAL_TABLET | Freq: Every day | ORAL | 3 refills | Status: DC

## 2022-08-30 NOTE — Progress Notes (Unsigned)
Cardiology Office Note:  .   Date:  08/30/2022  ID:  Tamara Larson, DOB 03-15-1940, MRN [id number] PCP: Sharon Seller, NP  [facility] Providers Cardiologist:  Nanetta Batty, MD { Click to update primary MD,subspecialty MD or APP then REFRESH:1}   History of Present Illness: .   Tamara Larson is a 82 y.o. female with a hx of hyperlipidemia, hypertension, and hypothyroidism.  Patient was previously referred to Dr. Allyson Sabal for evaluation of pulseless lower extremity.  She denied any claudication symptoms.  ABI was normal on 08/04/2016.  Echocardiogram obtained on 07/28/2017 showed EF 60 to 65%, grade 2 DD, mild MR. Per patient, she had a cardiac catheterization in the 1990s at [facility], I do not have the record.  I saw the patient in March 2022 for chest pain and palpitation.  I initially arranged a coronary CT, however coronary CT was unable to be completed as his heart rate was 120 with concerns of possible atrial fibrillation.  Unfortunately no EKG was recorded.  I recommended a 30-day heart monitor and a Lexiscan Myoview.  Myoview in July 2020 showed a medium sized moderate intensity reversible inferolateral perfusion defect suggestive of ischemia, normal EF.  Subsequent cardiac catheterization performed on 09/24/2018 showed proximal left circumflex and mid LAD lesion that was treated with drug-eluting stents, 50% mid RCA residual.  Heart monitor did show evidence of atrial fibrillation and she was placed on triple therapy including aspirin, Plavix and Eliquis.  She was admitted in September 2020 for syncope.  On presentation, she was in A-fib with RVR and is subsequently converted to sinus rhythm.  She was found to have hyperthyroidism with TSH at 0.258.  Her Synthroid was down titrated.  She was last seen by Dr. Gery Pray in October 2023 at which time she was back in atrial fibrillation, metoprolol tartrate was increased from 12.5 mg twice a day to 25 mg twice a day.   I last saw the  patient in May 2024, she was in atrial fibrillation with RVR at the time with heart rate in the 120s.  Review of EKG showed she was last in sinus rhythm in January 2022 and has been in atrial fibrillation since at least October 2023.  She likely has been in A-fib since.  She had minimal cardiac awareness.  I lowered her amlodipine and increase her Toprol tartrate to 50 mg twice a day for better rate control.  I also recommended echocardiogram.  We discussed either cardioversion versus rate control therapy, we ultimately decided to pursue rate control management.  Echocardiogram performed on 08/23/2022 showed EF 60 to 65%, mild LVH, severely dilated left atrium, mild MR, small patent foramen ovale.  Her metoprolol was further increased to 75 mg twice a day during A-fib clinic visit on 08/02/2022.  Patient presents today for follow-up.  Her heart rate is very well-controlled.  Blood pressure also normal as well.  Overall she has been feeling well.  Her only complaint is she is having some lower extremity edema.  On physical exam, she has 1-2+ pitting edema in bilateral lower extremity.  She says her edema would improve after overnight sleep however does not completely go away.  She has some compression stocking at home which she will start using.  We discussed salt restriction, leg elevation and compression stocking.  Since her swelling does not completely go away after overnight sleep, I decided to start her on a low-dose 20 mg daily of Lasix.  She has  a history of CKD stage III-IV, last creatinine was 1.83 in January 2024.  I will refer her to nephrology service.  She will obtain basic metabolic panel and urine microalbumin to creatinine ratio in 1 week.  She can follow-up with Dr. Allyson Sabal in 63-month.  ROS: ***  Studies Reviewed: .        *** Risk Assessment/Calculations:   {Does this patient have ATRIAL FIBRILLATION?:[phone]}         Physical Exam:   VS:  BP 130/84   Pulse 86   Ht 5\' 7"  (1.702 m)    Wt 186 lb 3.2 oz (84.5 kg)   SpO2 98%   BMI 29.16 kg/m    Wt Readings from Last 3 Encounters:  08/30/22 186 lb 3.2 oz (84.5 kg)  08/02/22 186 lb (84.4 kg)  07/19/22 185 lb 12.8 oz (84.3 kg)    GEN: Well nourished, well developed in no acute distress NECK: No JVD; No carotid bruits CARDIAC: ***RRR, no murmurs, rubs, gallops RESPIRATORY:  Clear to auscultation without rales, wheezing or rhonchi  ABDOMEN: Soft, non-tender, non-distended EXTREMITIES:  No edema; No deformity   ASSESSMENT AND PLAN: .   ***    {Are you ordering a CV Procedure (e.g. stress test, cath, DCCV, TEE, etc)?   Press F2        :[id number]}  Dispo: ***  Signed, Azalee Course, PA

## 2022-08-30 NOTE — Patient Instructions (Addendum)
Medication Instructions:    Start taking Lasix ( furosemide)  20 mg - use for leg swelling   *If you need a refill on your cardiac medications before your next appointment, please call your pharmacy*   Lab Work:in one week ( July 9,2024)  BMP Urine - microalbumin  creat/ratio  If you have labs (blood work) drawn today and your tests are completely normal, you will receive your results only by: MyChart Message (if you have MyChart) OR A paper copy in the mail If you have any lab test that is abnormal or we need to change your treatment, we will call you to review the results.   Testing/Procedures:  Not needed  Follow-Up: At [facility], you and your health needs are our priority.  As part of our continuing mission to provide you with exceptional heart care, we have created designated Provider Care Teams.  These Care Teams include your primary Cardiologist (physician) and Advanced Practice Providers (APPs -  Physician Assistants and Nurse Practitioners) who all work together to provide you with the care you need, when you need it.     Your next appointment:   4 month(s)  The format for your next appointment:   In Person  Provider:   Nanetta Batty, MD   You have been referred to Nephrology --[facility] - on [address]

## 2022-09-08 LAB — MICROALBUMIN / CREATININE URINE RATIO
Creatinine, Urine: 171.9 mg/dL
Microalb/Creat Ratio: 175 mg/g creat — ABNORMAL HIGH (ref 0–29)
Microalbumin, Urine: 301.5 ug/mL

## 2022-09-08 LAB — BASIC METABOLIC PANEL
BUN/Creatinine Ratio: 13 (ref 12–28)
BUN: 26 mg/dL (ref 8–27)
CO2: 20 mmol/L (ref 20–29)
Calcium: 9.4 mg/dL (ref 8.7–10.3)
Chloride: 108 mmol/L — ABNORMAL HIGH (ref 96–106)
Creatinine, Ser: 2.04 mg/dL — ABNORMAL HIGH (ref 0.57–1.00)
Glucose: 188 mg/dL — ABNORMAL HIGH (ref 70–99)
Potassium: 3.6 mmol/L (ref 3.5–5.2)
Sodium: 145 mmol/L — ABNORMAL HIGH (ref 134–144)
eGFR: 24 mL/min/{1.73_m2} — ABNORMAL LOW (ref >59)

## 2022-09-27 ENCOUNTER — Other Ambulatory Visit: Payer: Self-pay | Admitting: Family

## 2022-10-31 ENCOUNTER — Other Ambulatory Visit: Payer: Self-pay | Admitting: Family

## 2022-11-01 ENCOUNTER — Other Ambulatory Visit: Payer: Self-pay | Admitting: Nurse Practitioner

## 2022-11-01 ENCOUNTER — Other Ambulatory Visit: Payer: Self-pay | Admitting: Cardiovascular Disease

## 2022-11-01 DIAGNOSIS — Z1231 Encounter for screening mammogram for malignant neoplasm of breast: Secondary | ICD-10-CM

## 2022-11-01 DIAGNOSIS — I480 Paroxysmal atrial fibrillation: Secondary | ICD-10-CM

## 2022-11-01 NOTE — Telephone Encounter (Signed)
Prescription refill request for Eliquis received. Indication: a fib Last office visit: 08/30/22 Scr: 2.04 epic 09/07/22 Age: 82 Weight: 84kg

## 2022-11-09 LAB — CBC AND DIFFERENTIAL
HCT: 35 — AB (ref 36–46)
Hemoglobin: 11.6 — AB (ref 12.0–16.0)
Platelets: 241 10*3/uL (ref 150–400)
WBC: 9.8

## 2022-11-09 LAB — CBC: RBC: 3.93 (ref 3.87–5.11)

## 2022-11-09 LAB — COMPREHENSIVE METABOLIC PANEL
Albumin: 4 (ref 3.5–5.0)
Calcium: 9.4 (ref 8.7–10.7)
Globulin: 2.1

## 2022-11-09 LAB — IRON,TIBC AND FERRITIN PANEL
%SAT: 13
Ferritin: 37
Iron: 40
TIBC: 303
UIBC: 263

## 2022-11-09 LAB — BASIC METABOLIC PANEL
BUN: 24 — AB (ref 4–21)
CO2: 25 — AB (ref 13–22)
Chloride: 108 (ref 99–108)
Creatinine: 2.1 — AB (ref 0.5–1.1)
Glucose: 109
Potassium: 3.3 mEq/L — AB (ref 3.5–5.1)
Sodium: 142 (ref 137–147)

## 2022-11-09 LAB — HEPATIC FUNCTION PANEL
ALT: 20 U/L (ref 7–35)
AST: 12 — AB (ref 13–35)
Alkaline Phosphatase: 77 (ref 25–125)
Bilirubin, Total: 0.8

## 2022-11-10 ENCOUNTER — Other Ambulatory Visit: Payer: Self-pay | Admitting: Internal Medicine

## 2022-11-10 DIAGNOSIS — I480 Paroxysmal atrial fibrillation: Secondary | ICD-10-CM

## 2022-11-10 DIAGNOSIS — N1832 Chronic kidney disease, stage 3b: Secondary | ICD-10-CM

## 2022-11-10 DIAGNOSIS — E785 Hyperlipidemia, unspecified: Secondary | ICD-10-CM

## 2022-11-10 DIAGNOSIS — R600 Localized edema: Secondary | ICD-10-CM

## 2022-11-10 DIAGNOSIS — E039 Hypothyroidism, unspecified: Secondary | ICD-10-CM

## 2022-11-17 ENCOUNTER — Ambulatory Visit
Admission: RE | Admit: 2022-11-17 | Discharge: 2022-11-17 | Disposition: A | Discharge status: Home / Self Care | Payer: Medicare HMO | Source: Ambulatory Visit | Attending: Internal Medicine | Admitting: Internal Medicine

## 2022-11-17 DIAGNOSIS — N1832 Chronic kidney disease, stage 3b: Secondary | ICD-10-CM

## 2022-11-17 DIAGNOSIS — E785 Hyperlipidemia, unspecified: Secondary | ICD-10-CM

## 2022-11-17 DIAGNOSIS — E039 Hypothyroidism, unspecified: Secondary | ICD-10-CM

## 2022-11-17 DIAGNOSIS — R600 Localized edema: Secondary | ICD-10-CM

## 2022-11-17 DIAGNOSIS — I480 Paroxysmal atrial fibrillation: Secondary | ICD-10-CM

## 2022-12-05 ENCOUNTER — Ambulatory Visit
Admission: RE | Admit: 2022-12-05 | Discharge: 2022-12-05 | Disposition: A | Discharge status: Home / Self Care | Payer: Medicare HMO | Source: Ambulatory Visit | Attending: Nurse Practitioner | Admitting: Nurse Practitioner

## 2022-12-05 DIAGNOSIS — Z1231 Encounter for screening mammogram for malignant neoplasm of breast: Secondary | ICD-10-CM

## 2022-12-08 ENCOUNTER — Emergency Department (HOSPITAL_COMMUNITY): Payer: Medicare HMO

## 2022-12-08 ENCOUNTER — Encounter (HOSPITAL_COMMUNITY): Payer: Self-pay | Admitting: Emergency Medicine

## 2022-12-08 ENCOUNTER — Inpatient Hospital Stay (HOSPITAL_COMMUNITY)
Admission: EM | Admit: 2022-12-08 | Discharge: 2022-12-30 | DRG: 853 | Disposition: E | Payer: Medicare HMO | Attending: Internal Medicine | Admitting: Internal Medicine

## 2022-12-08 ENCOUNTER — Other Ambulatory Visit: Payer: Self-pay

## 2022-12-08 DIAGNOSIS — A419 Sepsis, unspecified organism: Principal | ICD-10-CM | POA: Diagnosis present

## 2022-12-08 DIAGNOSIS — Z91158 Patient's noncompliance with renal dialysis for other reason: Secondary | ICD-10-CM

## 2022-12-08 DIAGNOSIS — E039 Hypothyroidism, unspecified: Secondary | ICD-10-CM | POA: Diagnosis present

## 2022-12-08 DIAGNOSIS — Z87410 Personal history of cervical dysplasia: Secondary | ICD-10-CM

## 2022-12-08 DIAGNOSIS — R6521 Severe sepsis with septic shock: Secondary | ICD-10-CM | POA: Diagnosis present

## 2022-12-08 DIAGNOSIS — Z91013 Allergy to seafood: Secondary | ICD-10-CM

## 2022-12-08 DIAGNOSIS — W19XXXA Unspecified fall, initial encounter: Principal | ICD-10-CM

## 2022-12-08 DIAGNOSIS — H409 Unspecified glaucoma: Secondary | ICD-10-CM | POA: Diagnosis present

## 2022-12-08 DIAGNOSIS — R531 Weakness: Secondary | ICD-10-CM | POA: Diagnosis not present

## 2022-12-08 DIAGNOSIS — J9602 Acute respiratory failure with hypercapnia: Secondary | ICD-10-CM | POA: Diagnosis present

## 2022-12-08 DIAGNOSIS — E8721 Acute metabolic acidosis: Secondary | ICD-10-CM | POA: Diagnosis present

## 2022-12-08 DIAGNOSIS — Z87891 Personal history of nicotine dependence: Secondary | ICD-10-CM

## 2022-12-08 DIAGNOSIS — D689 Coagulation defect, unspecified: Secondary | ICD-10-CM | POA: Diagnosis present

## 2022-12-08 DIAGNOSIS — Z1152 Encounter for screening for COVID-19: Secondary | ICD-10-CM

## 2022-12-08 DIAGNOSIS — Z7901 Long term (current) use of anticoagulants: Secondary | ICD-10-CM

## 2022-12-08 DIAGNOSIS — W1830XA Fall on same level, unspecified, initial encounter: Secondary | ICD-10-CM | POA: Diagnosis present

## 2022-12-08 DIAGNOSIS — Z860100 Personal history of colon polyps, unspecified: Secondary | ICD-10-CM

## 2022-12-08 DIAGNOSIS — N184 Chronic kidney disease, stage 4 (severe): Secondary | ICD-10-CM | POA: Diagnosis present

## 2022-12-08 DIAGNOSIS — M545 Low back pain, unspecified: Secondary | ICD-10-CM | POA: Diagnosis present

## 2022-12-08 DIAGNOSIS — G8929 Other chronic pain: Secondary | ICD-10-CM | POA: Diagnosis present

## 2022-12-08 DIAGNOSIS — R1900 Intra-abdominal and pelvic swelling, mass and lump, unspecified site: Secondary | ICD-10-CM | POA: Diagnosis present

## 2022-12-08 DIAGNOSIS — Z66 Do not resuscitate: Secondary | ICD-10-CM | POA: Diagnosis present

## 2022-12-08 DIAGNOSIS — S02832A Fracture of medial orbital wall, left side, initial encounter for closed fracture: Secondary | ICD-10-CM | POA: Diagnosis present

## 2022-12-08 DIAGNOSIS — I2510 Atherosclerotic heart disease of native coronary artery without angina pectoris: Secondary | ICD-10-CM | POA: Diagnosis present

## 2022-12-08 DIAGNOSIS — H1132 Conjunctival hemorrhage, left eye: Secondary | ICD-10-CM | POA: Diagnosis present

## 2022-12-08 DIAGNOSIS — R578 Other shock: Secondary | ICD-10-CM | POA: Diagnosis present

## 2022-12-08 DIAGNOSIS — Z7952 Long term (current) use of systemic steroids: Secondary | ICD-10-CM

## 2022-12-08 DIAGNOSIS — I9589 Other hypotension: Secondary | ICD-10-CM | POA: Diagnosis present

## 2022-12-08 DIAGNOSIS — D62 Acute posthemorrhagic anemia: Secondary | ICD-10-CM | POA: Diagnosis present

## 2022-12-08 DIAGNOSIS — J9601 Acute respiratory failure with hypoxia: Secondary | ICD-10-CM | POA: Diagnosis present

## 2022-12-08 DIAGNOSIS — Z79899 Other long term (current) drug therapy: Secondary | ICD-10-CM

## 2022-12-08 DIAGNOSIS — S0232XA Fracture of orbital floor, left side, initial encounter for closed fracture: Secondary | ICD-10-CM | POA: Diagnosis present

## 2022-12-08 DIAGNOSIS — Z961 Presence of intraocular lens: Secondary | ICD-10-CM | POA: Diagnosis present

## 2022-12-08 DIAGNOSIS — K661 Hemoperitoneum: Secondary | ICD-10-CM | POA: Diagnosis present

## 2022-12-08 DIAGNOSIS — Z8249 Family history of ischemic heart disease and other diseases of the circulatory system: Secondary | ICD-10-CM

## 2022-12-08 DIAGNOSIS — Z955 Presence of coronary angioplasty implant and graft: Secondary | ICD-10-CM

## 2022-12-08 DIAGNOSIS — E8729 Other acidosis: Secondary | ICD-10-CM | POA: Diagnosis present

## 2022-12-08 DIAGNOSIS — N179 Acute kidney failure, unspecified: Secondary | ICD-10-CM | POA: Diagnosis present

## 2022-12-08 DIAGNOSIS — Z515 Encounter for palliative care: Secondary | ICD-10-CM

## 2022-12-08 DIAGNOSIS — E7800 Pure hypercholesterolemia, unspecified: Secondary | ICD-10-CM | POA: Diagnosis present

## 2022-12-08 DIAGNOSIS — Z79890 Hormone replacement therapy (postmenopausal): Secondary | ICD-10-CM

## 2022-12-08 DIAGNOSIS — I129 Hypertensive chronic kidney disease with stage 1 through stage 4 chronic kidney disease, or unspecified chronic kidney disease: Secondary | ICD-10-CM | POA: Diagnosis present

## 2022-12-08 DIAGNOSIS — S80212A Abrasion, left knee, initial encounter: Secondary | ICD-10-CM | POA: Diagnosis present

## 2022-12-08 DIAGNOSIS — D649 Anemia, unspecified: Secondary | ICD-10-CM

## 2022-12-08 DIAGNOSIS — Y92009 Unspecified place in unspecified non-institutional (private) residence as the place of occurrence of the external cause: Secondary | ICD-10-CM

## 2022-12-08 DIAGNOSIS — J690 Pneumonitis due to inhalation of food and vomit: Secondary | ICD-10-CM | POA: Diagnosis present

## 2022-12-08 DIAGNOSIS — I4819 Other persistent atrial fibrillation: Secondary | ICD-10-CM | POA: Diagnosis present

## 2022-12-08 DIAGNOSIS — Z841 Family history of disorders of kidney and ureter: Secondary | ICD-10-CM

## 2022-12-08 LAB — ETHANOL: Alcohol, Ethyl (B): <10 mg/dL (ref <10)

## 2022-12-08 LAB — I-STAT CHEM 8, ED
BUN: 43 mg/dL — ABNORMAL HIGH (ref 8–23)
Calcium, Ion: 1.14 mmol/L — ABNORMAL LOW (ref 1.15–1.40)
Chloride: 110 mmol/L (ref 98–111)
Creatinine, Ser: 3.2 mg/dL — ABNORMAL HIGH (ref 0.44–1.00)
Glucose, Bld: 190 mg/dL — ABNORMAL HIGH (ref 70–99)
HCT: 21 % — ABNORMAL LOW (ref 36.0–46.0)
Hemoglobin: 7.1 g/dL — ABNORMAL LOW (ref 12.0–15.0)
Potassium: 4.7 mmol/L (ref 3.5–5.1)
Sodium: 138 mmol/L (ref 135–145)
TCO2: 11 mmol/L — ABNORMAL LOW (ref 22–32)

## 2022-12-08 LAB — CBC
HCT: 22.5 % — ABNORMAL LOW (ref 36.0–46.0)
Hemoglobin: 6.7 g/dL — CL (ref 12.0–15.0)
MCH: 29.6 pg (ref 26.0–34.0)
MCHC: 29.8 g/dL — ABNORMAL LOW (ref 30.0–36.0)
MCV: 99.6 fL (ref 80.0–100.0)
Platelets: 160 10*3/uL (ref 150–400)
RBC: 2.26 MIL/uL — ABNORMAL LOW (ref 3.87–5.11)
RDW: 18.3 % — ABNORMAL HIGH (ref 11.5–15.5)
WBC: 20.8 10*3/uL — ABNORMAL HIGH (ref 4.0–10.5)
nRBC: 0 % (ref 0.0–0.2)

## 2022-12-08 LAB — PROTIME-INR
INR: 1.7 — ABNORMAL HIGH (ref 0.8–1.2)
Prothrombin Time: 20.1 s — ABNORMAL HIGH (ref 11.4–15.2)

## 2022-12-08 LAB — POC OCCULT BLOOD, ED: Fecal Occult Bld: NEGATIVE

## 2022-12-08 LAB — I-STAT CG4 LACTIC ACID, ED: Lactic Acid, Venous: >15 mmol/L (ref 0.5–1.9)

## 2022-12-08 LAB — SAMPLE TO BLOOD BANK

## 2022-12-08 MED ORDER — SODIUM CHLORIDE 0.9 % IV BOLUS
1000.0000 mL | Freq: Once | INTRAVENOUS | Status: AC
  Administered 2022-12-08: 1000 mL via INTRAVENOUS

## 2022-12-08 MED ORDER — VANCOMYCIN HCL 2000 MG/400ML IV SOLN
2000.0000 mg | Freq: Once | INTRAVENOUS | Status: AC
  Administered 2022-12-09: 2000 mg via INTRAVENOUS
  Filled 2022-12-08: qty 400

## 2022-12-08 MED ORDER — SODIUM CHLORIDE 0.9 % IV SOLN
2.0000 g | Freq: Once | INTRAVENOUS | Status: AC
  Administered 2022-12-08: 2 g via INTRAVENOUS
  Filled 2022-12-08: qty 12.5

## 2022-12-08 MED ORDER — SODIUM CHLORIDE 0.9% IV SOLUTION: Freq: Once | INTRAVENOUS | Status: AC

## 2022-12-08 MED ORDER — METRONIDAZOLE 500 MG/100ML IV SOLN
500.0000 mg | Freq: Once | INTRAVENOUS | Status: AC
  Administered 2022-12-08: 500 mg via INTRAVENOUS
  Filled 2022-12-08: qty 100

## 2022-12-08 MED ORDER — IOHEXOL 350 MG/ML SOLN
75.0000 mL | Freq: Once | INTRAVENOUS | Status: AC | PRN
  Administered 2022-12-08: 75 mL via INTRAVENOUS

## 2022-12-08 NOTE — ED Triage Notes (Signed)
Pt BIB EMS from home,pt is lethargic, co lower back pain, pt reports surgery on the left eye. Eye has swelling and bruising, pt also with abrasions to the knee, is on Eliquis, EMS reported AFIB with RVR enroute to ED>

## 2022-12-08 NOTE — ED Notes (Signed)
C-Collar applied
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
## 2022-12-08 NOTE — Progress Notes (Signed)
ED Pharmacy Antibiotic Sign Off An antibiotic consult was received from an ED provider for vancomycin and cefepime per pharmacy dosing for sepsis. A chart review was completed to assess appropriateness.   The following one time order(s) were placed:  Cefepime 2g x 1  Vancomycin 2g x 1  Further antibiotic and/or antibiotic pharmacy consults should be ordered by the admitting provider if indicated.   Thank you for allowing pharmacy to be a part of this patient's care.   Marja Kays, [facility]  Clinical Pharmacist 12/29/2022 11:12 PM

## 2022-12-08 NOTE — ED Provider Notes (Signed)
[place] EMERGENCY DEPARTMENT AT [facility] Provider Note   CSN: [id number] Arrival date & time: 2023-01-03  2232     History  Chief Complaint  Patient presents with   Marletta Lor    Tamara Larson is a 82 y.o. female.  Pt is a 82 yo female with pmhx significant for htn, elevated cholesterol, hypothyroidism, and afib (eliquis).  Pt said she has been feeling weak all day.  She did fall and hit her head.  No loc.  Pt c/o face, back, and abd pain.  Pt's bp in the 80s, so a level 1 trauma was called.        Home Medications Prior to Admission medications   Medication Sig Start Date End Date Taking? Authorizing Provider  ELIQUIS 2.5 MG TABS tablet TAKE 1 TABLET BY MOUTH TWICE A DAY 11/01/22  Yes Runell Gess, MD  acetaminophen (TYLENOL) 650 MG CR tablet Take 650 mg by mouth every 8 (eight) hours as needed for pain.    [provider]  amLODipine (NORVASC) 5 MG tablet Take 1 tablet (5 mg total) by mouth daily. 07/19/22   Azalee Course, PA  furosemide (LASIX) 20 MG tablet Take 1 tablet (20 mg total) by mouth daily. 08/30/22 11/28/22  Azalee Course, PA  levothyroxine (SYNTHROID) 75 MCG tablet TAKE 1 TABLET BY MOUTH EVERY DAY BEFORE BREAKFAST 03/07/22   Sharon Seller, NP  losartan (COZAAR) 100 MG tablet Take 1 tablet (100 mg total) by mouth daily. 03/07/22   Sharon Seller, NP  metoprolol tartrate (LOPRESSOR) 50 MG tablet Take 1.5 tablets (75 mg total) by mouth 2 (two) times daily. 08/02/22   Fenton, Clint R, PA  Multiple Vitamin (MULTIVITAMIN WITH MINERALS) TABS tablet Take 1 tablet by mouth daily. Centrum Silver    [provider]  nitroGLYCERIN (NITROSTAT) 0.4 MG SL tablet Place 1 tablet (0.4 mg total) under the tongue every 5 (five) minutes as needed for chest pain. 05/22/20   Runell Gess, MD  pantoprazole (PROTONIX) 40 MG tablet TAKE 1 TABLET BY MOUTH EVERY DAY Patient taking differently: Only takes as needed 03/30/22   Runell Gess, MD  potassium chloride  SA (KLOR-CON M20) 20 MEQ tablet Take 1 tablet (20 mEq total) by mouth daily. TAKE TWO TABLETS THE FIRST DAY AND THEN ONE TABLET BY MOUTH DAILY. Patient taking differently: Take 20 mEq by mouth daily. 03/07/22   Sharon Seller, NP  predniSONE (DELTASONE) 10 MG tablet TAKE 1 TABLET BY MOUTH EVERY DAY AS NEEDED 11/01/22   Adonis Huguenin, NP  rosuvastatin (CRESTOR) 40 MG tablet TAKE 1 TABLET BY MOUTH EVERY DAY 05/02/22   Sharon Seller, NP      Allergies    Shellfish allergy    Review of Systems   Review of Systems  HENT:  Positive for facial swelling.   Eyes:        Subconjunctival hemorrhage; eomi; eyelid contusion to left  All other systems reviewed and are negative.   Physical Exam Updated Vital Signs BP 101/60 (BP Location: Right Arm)   Pulse (!) 103   Temp (!) 95.8 F (35.4 C)   Resp (!) 128   Wt 84.5 kg   SpO2 99%   BMI 29.18 kg/m  Physical Exam Vitals and nursing note reviewed.  Constitutional:      Appearance: Normal appearance.  HENT:     Head: Normocephalic.     Comments: Left eye subconjunctival hem; left eyelid contusion; left lower  lip contusion    Right Ear: External ear normal.     Left Ear: External ear normal.     Nose: Nose normal.     Mouth/Throat:     Mouth: Mucous membranes are moist.     Pharynx: Oropharynx is clear.  Eyes:     Extraocular Movements: Extraocular movements intact.     Pupils: Pupils are equal, round, and reactive to light.  Cardiovascular:     Rate and Rhythm: Tachycardia present. Rhythm irregular.     Pulses: Normal pulses.     Heart sounds: Normal heart sounds.  Pulmonary:     Effort: Pulmonary effort is normal.     Breath sounds: Normal breath sounds.  Abdominal:     General: Abdomen is flat. Bowel sounds are normal.     Palpations: Abdomen is soft.  Genitourinary:    Rectum: Guaiac result positive.  Musculoskeletal:        General: Normal range of motion.     Cervical back: Normal range of motion and neck supple.   Skin:    General: Skin is warm.     Capillary Refill: Capillary refill takes less than 2 seconds.  Neurological:     General: No focal deficit present.     Mental Status: She is alert and oriented to person, place, and time.  Psychiatric:        Mood and Affect: Mood normal.        Behavior: Behavior normal.     ED Results / Procedures / Treatments   Labs (all labs ordered are listed, but only abnormal results are displayed) Labs Reviewed  CBC - Abnormal; Notable for the following components:      Result Value   WBC 20.8 (*)    RBC 2.26 (*)    Hemoglobin 6.7 (*)    HCT 22.5 (*)    MCHC 29.8 (*)    RDW 18.3 (*)    All other components within normal limits  PROTIME-INR - Abnormal; Notable for the following components:   Prothrombin Time 20.1 (*)    INR 1.7 (*)    All other components within normal limits  I-STAT CHEM 8, ED - Abnormal; Notable for the following components:   BUN 43 (*)    Creatinine, Ser 3.20 (*)    Glucose, Bld 190 (*)    Calcium, Ion 1.14 (*)    TCO2 11 (*)    Hemoglobin 7.1 (*)    HCT 21.0 (*)    All other components within normal limits  I-STAT CG4 LACTIC ACID, ED - Abnormal; Notable for the following components:   Lactic Acid, Venous >15.0 (*)    All other components within normal limits  RESP PANEL BY RT-PCR (RSV, FLU A&B, COVID)  RVPGX2  CULTURE, BLOOD (ROUTINE X 2)  CULTURE, BLOOD (ROUTINE X 2)  COMPREHENSIVE METABOLIC PANEL  ETHANOL  URINALYSIS, W/ REFLEX TO CULTURE (INFECTION SUSPECTED)  POC OCCULT BLOOD, ED  SAMPLE TO BLOOD BANK  TYPE AND SCREEN  PREPARE RBC (CROSSMATCH)    EKG None  Radiology No results found.  Procedures Procedures    Medications Ordered in ED Medications  sodium chloride 0.9 % bolus 1,000 mL (has no administration in time range)  metroNIDAZOLE (FLAGYL) IVPB 500 mg (has no administration in time range)  vancomycin (VANCOREADY) IVPB 2000 mg/400 mL (has no administration in time range)  ceFEPIme (MAXIPIME)  2 g in sodium chloride 0.9 % 100 mL IVPB (has no administration in time range)  0.9 %  sodium chloride infusion (Manually program via Guardrails IV Fluids) (has no administration in time range)    ED Course/ Medical Decision Making/ A&P                                 Medical Decision Making Amount and/or Complexity of Data Reviewed Labs: ordered. Radiology: ordered. ECG/medicine tests: ordered.  Risk Prescription drug management.   This patient presents to the ED for concern of fall, this involves an extensive number of treatment options, and is a complaint that carries with it a high risk of complications and morbidity.  The differential diagnosis includes multiple trauma, electrolyte abn, infection   Co morbidities that complicate the patient evaluation  tn, elevated cholesterol, hypothyroidism, and afib (eliquis)   Additional history obtained:  Additional history obtained from epic chart review External records from outside source obtained and reviewed including EMS report   Lab Tests:  I Ordered, and personally interpreted labs.  The pertinent results include:  wbc elevated at 20.8, hgb low at 6.7 (hgb 11.6 on 11/09/22);   Imaging Studies ordered:  I ordered imaging studies including ct head/c-spine, ct chest/abd/pelvis  Pending at shift change   Cardiac Monitoring:  The patient was maintained on a cardiac monitor.  I personally viewed and interpreted the cardiac monitored which showed an underlying rhythm of: afib   Medicines ordered and prescription drug management:  I ordered medication including abx and transfusion  for sx  Reevaluation of the patient after these medicines showed that the patient improved I have reviewed the patients home medicines and have made adjustments as needed   Test Considered:  ct   Critical Interventions:  Trauma 1   Consultations Obtained:  I requested consultation with the trauma doc (Dr. Andrey Campanile),  and discussed  lab and imaging findings as well as pertinent plan - he did see pt in consult.   Problem List / ED Course:  Fall:  trauma eval pending Symptomatic anemia:  transfusion ordered Hypotension and tachy:  code sepsis ordered   Reevaluation:  After the interventions noted above, I reevaluated the patient and found that they have :improved   Social Determinants of Health:  Lives at home   Dispostion:  After consideration of the diagnostic results and the patients response to treatment, I feel that the patent would benefit from admission.    CRITICAL CARE Performed by: Jacalyn Lefevre   Total critical care time: 30 minutes  Critical care time was exclusive of separately billable procedures and treating other patients.  Critical care was necessary to treat or prevent imminent or life-threatening deterioration.  Critical care was time spent personally by me on the following activities: development of treatment plan with patient and/or surrogate as well as nursing, discussions with consultants, evaluation of patient's response to treatment, examination of patient, obtaining history from patient or surrogate, ordering and performing treatments and interventions, ordering and review of laboratory studies, ordering and review of radiographic studies, pulse oximetry and re-evaluation of patient's condition.         Final Clinical Impression(s) / ED Diagnoses Final diagnoses:  Fall, initial encounter  On apixaban therapy  Symptomatic anemia    Rx / DC Orders ED Discharge Orders     None         Jacalyn Lefevre, MD 12/14/2022 2339
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
## 2022-12-08 NOTE — ED Notes (Signed)
Date and time results received: 11/30/2022 23:55 (use smartphrase ".now" to insert current time)  Test: Hemoglobin  Critical Value: 6.7  Name of Provider Notified: Earna Coder MD  Orders Received? Or Actions Taken?:  MD to Review

## 2022-12-08 NOTE — Sepsis Progress Note (Signed)
Elink monitoring for the code sepsis protocol.  

## 2022-12-09 ENCOUNTER — Inpatient Hospital Stay (HOSPITAL_COMMUNITY): Payer: Medicare HMO

## 2022-12-09 DIAGNOSIS — Z515 Encounter for palliative care: Secondary | ICD-10-CM | POA: Diagnosis not present

## 2022-12-09 DIAGNOSIS — Y92009 Unspecified place in unspecified non-institutional (private) residence as the place of occurrence of the external cause: Secondary | ICD-10-CM | POA: Diagnosis not present

## 2022-12-09 DIAGNOSIS — J9602 Acute respiratory failure with hypercapnia: Secondary | ICD-10-CM | POA: Diagnosis present

## 2022-12-09 DIAGNOSIS — N179 Acute kidney failure, unspecified: Secondary | ICD-10-CM

## 2022-12-09 DIAGNOSIS — S02832A Fracture of medial orbital wall, left side, initial encounter for closed fracture: Secondary | ICD-10-CM | POA: Diagnosis present

## 2022-12-09 DIAGNOSIS — K661 Hemoperitoneum: Secondary | ICD-10-CM | POA: Diagnosis present

## 2022-12-09 DIAGNOSIS — E8721 Acute metabolic acidosis: Secondary | ICD-10-CM | POA: Diagnosis present

## 2022-12-09 DIAGNOSIS — J690 Pneumonitis due to inhalation of food and vomit: Secondary | ICD-10-CM | POA: Diagnosis present

## 2022-12-09 DIAGNOSIS — D689 Coagulation defect, unspecified: Secondary | ICD-10-CM | POA: Diagnosis present

## 2022-12-09 DIAGNOSIS — I4819 Other persistent atrial fibrillation: Secondary | ICD-10-CM | POA: Diagnosis present

## 2022-12-09 DIAGNOSIS — N184 Chronic kidney disease, stage 4 (severe): Secondary | ICD-10-CM | POA: Diagnosis present

## 2022-12-09 DIAGNOSIS — Z66 Do not resuscitate: Secondary | ICD-10-CM | POA: Diagnosis present

## 2022-12-09 DIAGNOSIS — Z1152 Encounter for screening for COVID-19: Secondary | ICD-10-CM | POA: Diagnosis not present

## 2022-12-09 DIAGNOSIS — R6521 Severe sepsis with septic shock: Secondary | ICD-10-CM | POA: Diagnosis present

## 2022-12-09 DIAGNOSIS — D62 Acute posthemorrhagic anemia: Secondary | ICD-10-CM | POA: Diagnosis present

## 2022-12-09 DIAGNOSIS — A419 Sepsis, unspecified organism: Secondary | ICD-10-CM | POA: Diagnosis present

## 2022-12-09 DIAGNOSIS — W1830XA Fall on same level, unspecified, initial encounter: Secondary | ICD-10-CM | POA: Diagnosis present

## 2022-12-09 DIAGNOSIS — E8729 Other acidosis: Secondary | ICD-10-CM | POA: Diagnosis present

## 2022-12-09 DIAGNOSIS — R531 Weakness: Secondary | ICD-10-CM | POA: Diagnosis present

## 2022-12-09 DIAGNOSIS — E7800 Pure hypercholesterolemia, unspecified: Secondary | ICD-10-CM | POA: Diagnosis present

## 2022-12-09 DIAGNOSIS — S0232XA Fracture of orbital floor, left side, initial encounter for closed fracture: Secondary | ICD-10-CM | POA: Diagnosis present

## 2022-12-09 DIAGNOSIS — G8929 Other chronic pain: Secondary | ICD-10-CM | POA: Diagnosis present

## 2022-12-09 DIAGNOSIS — E039 Hypothyroidism, unspecified: Secondary | ICD-10-CM | POA: Diagnosis present

## 2022-12-09 DIAGNOSIS — R578 Other shock: Secondary | ICD-10-CM

## 2022-12-09 DIAGNOSIS — I129 Hypertensive chronic kidney disease with stage 1 through stage 4 chronic kidney disease, or unspecified chronic kidney disease: Secondary | ICD-10-CM | POA: Diagnosis present

## 2022-12-09 DIAGNOSIS — J9601 Acute respiratory failure with hypoxia: Secondary | ICD-10-CM | POA: Diagnosis present

## 2022-12-09 HISTORY — PX: IR US GUIDE VASC ACCESS RIGHT: IMG2390

## 2022-12-09 HISTORY — PX: IR ANGIOGRAM SELECTIVE EACH ADDITIONAL VESSEL: IMG667

## 2022-12-09 HISTORY — PX: IR EMBO ART  VEN HEMORR LYMPH EXTRAV  INC GUIDE ROADMAPPING: IMG5450

## 2022-12-09 HISTORY — PX: IR ANGIOGRAM PELVIS SELECTIVE OR SUPRASELECTIVE: IMG661

## 2022-12-09 LAB — HEMOGLOBIN AND HEMATOCRIT, BLOOD
HCT: 32 % — ABNORMAL LOW (ref 36.0–46.0)
HCT: 30.6 % — ABNORMAL LOW (ref 36.0–46.0)
HCT: 27.9 % — ABNORMAL LOW (ref 36.0–46.0)
HCT: 27.1 % — ABNORMAL LOW (ref 36.0–46.0)
Hemoglobin: 10.6 g/dL — ABNORMAL LOW (ref 12.0–15.0)
Hemoglobin: 10.1 g/dL — ABNORMAL LOW (ref 12.0–15.0)
Hemoglobin: 9.5 g/dL — ABNORMAL LOW (ref 12.0–15.0)
Hemoglobin: 9 g/dL — ABNORMAL LOW (ref 12.0–15.0)

## 2022-12-09 LAB — RESP PANEL BY RT-PCR (RSV, FLU A&B, COVID)  RVPGX2
Influenza A by PCR: NEGATIVE
Influenza B by PCR: NEGATIVE
Resp Syncytial Virus by PCR: NEGATIVE
SARS Coronavirus 2 by RT PCR: NEGATIVE

## 2022-12-09 LAB — PROTIME-INR
INR: 1.6 — ABNORMAL HIGH (ref 0.8–1.2)
Prothrombin Time: 19.4 s — ABNORMAL HIGH (ref 11.4–15.2)

## 2022-12-09 LAB — BASIC METABOLIC PANEL
Anion gap: 20 — ABNORMAL HIGH (ref 5–15)
BUN: 28 mg/dL — ABNORMAL HIGH (ref 8–23)
CO2: 11 mmol/L — ABNORMAL LOW (ref 22–32)
Calcium: 7 mg/dL — ABNORMAL LOW (ref 8.9–10.3)
Chloride: 104 mmol/L (ref 98–111)
Creatinine, Ser: 2.26 mg/dL — ABNORMAL HIGH (ref 0.44–1.00)
GFR, Estimated: 21 mL/min — ABNORMAL LOW (ref >60)
Glucose, Bld: 76 mg/dL (ref 70–99)
Potassium: 4.1 mmol/L (ref 3.5–5.1)
Sodium: 135 mmol/L (ref 135–145)

## 2022-12-09 LAB — CBC
HCT: 20.5 % — ABNORMAL LOW (ref 36.0–46.0)
Hemoglobin: 6.1 g/dL — CL (ref 12.0–15.0)
MCH: 28.9 pg (ref 26.0–34.0)
MCHC: 29.8 g/dL — ABNORMAL LOW (ref 30.0–36.0)
MCV: 97.2 fL (ref 80.0–100.0)
Platelets: 106 10*3/uL — ABNORMAL LOW (ref 150–400)
RBC: 2.11 MIL/uL — ABNORMAL LOW (ref 3.87–5.11)
RDW: 17 % — ABNORMAL HIGH (ref 11.5–15.5)
WBC: 20.2 10*3/uL — ABNORMAL HIGH (ref 4.0–10.5)
nRBC: 0 % (ref 0.0–0.2)

## 2022-12-09 LAB — TSH: TSH: 3.349 u[IU]/mL (ref 0.350–4.500)

## 2022-12-09 LAB — COMPREHENSIVE METABOLIC PANEL
ALT: 17 U/L (ref 0–44)
AST: 28 U/L (ref 15–41)
Albumin: 2.8 g/dL — ABNORMAL LOW (ref 3.5–5.0)
Alkaline Phosphatase: 45 U/L (ref 38–126)
Anion gap: 21 — ABNORMAL HIGH (ref 5–15)
BUN: 32 mg/dL — ABNORMAL HIGH (ref 8–23)
CO2: 9 mmol/L — ABNORMAL LOW (ref 22–32)
Calcium: 8.8 mg/dL — ABNORMAL LOW (ref 8.9–10.3)
Chloride: 110 mmol/L (ref 98–111)
Creatinine, Ser: 3.06 mg/dL — ABNORMAL HIGH (ref 0.44–1.00)
GFR, Estimated: 15 mL/min — ABNORMAL LOW (ref >60)
Glucose, Bld: 197 mg/dL — ABNORMAL HIGH (ref 70–99)
Potassium: 4.8 mmol/L (ref 3.5–5.1)
Sodium: 140 mmol/L (ref 135–145)
Total Bilirubin: 1.3 mg/dL — ABNORMAL HIGH (ref 0.3–1.2)
Total Protein: 4.9 g/dL — ABNORMAL LOW (ref 6.5–8.1)

## 2022-12-09 LAB — LACTIC ACID, PLASMA
Lactic Acid, Venous: 7.8 mmol/L (ref 0.5–1.9)
Lactic Acid, Venous: 8.2 mmol/L (ref 0.5–1.9)

## 2022-12-09 LAB — ABO/RH: ABO/RH(D): O POS

## 2022-12-09 LAB — PREPARE RBC (CROSSMATCH)

## 2022-12-09 LAB — PHOSPHORUS: Phosphorus: 6 mg/dL — ABNORMAL HIGH (ref 2.5–4.6)

## 2022-12-09 LAB — GLUCOSE, CAPILLARY: Glucose-Capillary: 90 mg/dL (ref 70–99)

## 2022-12-09 LAB — MRSA NEXT GEN BY PCR, NASAL: MRSA by PCR Next Gen: NOT DETECTED

## 2022-12-09 LAB — MAGNESIUM: Magnesium: 2 mg/dL (ref 1.7–2.4)

## 2022-12-09 MED ORDER — PROTHROMBIN COMPLEX CONC HUMAN 500 UNITS IV KIT
2108.0000 [IU] | PACK | Status: AC
  Administered 2022-12-09: 2108 [IU] via INTRAVENOUS

## 2022-12-09 MED ORDER — MORPHINE SULFATE (PF) 2 MG/ML IV SOLN
2.0000 mg | Freq: Once | INTRAVENOUS | Status: AC
  Administered 2022-12-09: 2 mg via INTRAVENOUS
  Filled 2022-12-09: qty 1

## 2022-12-09 MED ORDER — PANTOPRAZOLE SODIUM 40 MG IV SOLR
40.0000 mg | Freq: Every day | INTRAVENOUS | Status: DC
  Administered 2022-12-09 – 2022-12-12 (×4): 40 mg via INTRAVENOUS
  Filled 2022-12-09 (×4): qty 10

## 2022-12-09 MED ORDER — TETRACAINE HCL 0.5 % OP SOLN
2.0000 [drp] | Freq: Once | OPHTHALMIC | Status: AC
  Administered 2022-12-09: 2 [drp] via OPHTHALMIC
  Filled 2022-12-09: qty 4

## 2022-12-09 MED ORDER — CEFAZOLIN SODIUM-DEXTROSE 2-4 GM/100ML-% IV SOLN
INTRAVENOUS | Status: AC
  Filled 2022-12-09: qty 100

## 2022-12-09 MED ORDER — ORAL CARE MOUTH RINSE
15.0000 mL | OROMUCOSAL | Status: DC
  Administered 2022-12-09 – 2022-12-11 (×11): 15 mL via OROMUCOSAL

## 2022-12-09 MED ORDER — SODIUM CHLORIDE 0.9 % IV SOLN
250.0000 mL | INTRAVENOUS | Status: DC
  Administered 2022-12-09: 250 mL via INTRAVENOUS

## 2022-12-09 MED ORDER — ONDANSETRON HCL 4 MG/2ML IJ SOLN
INTRAMUSCULAR | Status: AC
  Filled 2022-12-09: qty 2

## 2022-12-09 MED ORDER — IOHEXOL 300 MG/ML  SOLN
150.0000 mL | Freq: Once | INTRAMUSCULAR | Status: AC | PRN
  Administered 2022-12-09: 75 mL via INTRA_ARTERIAL

## 2022-12-09 MED ORDER — FENTANYL CITRATE (PF) 100 MCG/2ML IJ SOLN
INTRAMUSCULAR | Status: AC
  Filled 2022-12-09: qty 2

## 2022-12-09 MED ORDER — FENTANYL CITRATE (PF) 100 MCG/2ML IJ SOLN
INTRAMUSCULAR | Status: AC | PRN
Start: 2022-12-09 — End: 2022-12-09
  Administered 2022-12-09 (×3): 25 ug via INTRAVENOUS

## 2022-12-09 MED ORDER — CHLORHEXIDINE GLUCONATE CLOTH 2 % EX PADS
6.0000 | MEDICATED_PAD | Freq: Every day | CUTANEOUS | Status: DC
  Administered 2022-12-09 – 2022-12-12 (×4): 6 via TOPICAL

## 2022-12-09 MED ORDER — FENTANYL CITRATE PF 50 MCG/ML IJ SOSY
50.0000 ug | PREFILLED_SYRINGE | INTRAMUSCULAR | Status: DC | PRN
  Administered 2022-12-09 – 2022-12-10 (×7): 50 ug via INTRAVENOUS
  Filled 2022-12-09 (×6): qty 1

## 2022-12-09 MED ORDER — MORPHINE SULFATE (PF) 2 MG/ML IV SOLN
1.0000 mg | Freq: Once | INTRAVENOUS | Status: AC
  Administered 2022-12-09: 1 mg via INTRAVENOUS
  Filled 2022-12-09: qty 1

## 2022-12-09 MED ORDER — PHENYLEPHRINE HCL-NACL 20-0.9 MG/250ML-% IV SOLN
25.0000 ug/min | INTRAVENOUS | Status: DC
  Administered 2022-12-09: 35 ug/min via INTRAVENOUS
  Administered 2022-12-09: 25 ug/min via INTRAVENOUS
  Administered 2022-12-10: 75 ug/min via INTRAVENOUS
  Administered 2022-12-10: 65 ug/min via INTRAVENOUS
  Filled 2022-12-09 (×3): qty 250

## 2022-12-09 MED ORDER — FLUORESCEIN SODIUM 1 MG OP STRP
1.0000 | ORAL_STRIP | Freq: Once | OPHTHALMIC | Status: AC
  Administered 2022-12-09: 1 via OPHTHALMIC
  Filled 2022-12-09: qty 1

## 2022-12-09 MED ORDER — FENTANYL CITRATE PF 50 MCG/ML IJ SOSY
25.0000 ug | PREFILLED_SYRINGE | Freq: Once | INTRAMUSCULAR | Status: AC
  Administered 2022-12-09: 25 ug via INTRAVENOUS
  Filled 2022-12-09: qty 1

## 2022-12-09 MED ORDER — LIDOCAINE 5 % EX PTCH
1.0000 | MEDICATED_PATCH | CUTANEOUS | Status: DC
  Administered 2022-12-09 – 2022-12-12 (×4): 1 via TRANSDERMAL
  Filled 2022-12-09 (×4): qty 1

## 2022-12-09 MED ORDER — CALCIUM GLUCONATE-NACL 2-0.675 GM/100ML-% IV SOLN
2.0000 g | Freq: Once | INTRAVENOUS | Status: AC
  Administered 2022-12-09: 2000 mg via INTRAVENOUS
  Filled 2022-12-09: qty 100

## 2022-12-09 MED ORDER — ONDANSETRON HCL 4 MG/2ML IJ SOLN
4.0000 mg | Freq: Four times a day (QID) | INTRAMUSCULAR | Status: DC | PRN
  Administered 2022-12-09 – 2022-12-10 (×2): 4 mg via INTRAVENOUS
  Filled 2022-12-09: qty 2

## 2022-12-09 MED ORDER — FENTANYL CITRATE PF 50 MCG/ML IJ SOSY
PREFILLED_SYRINGE | INTRAMUSCULAR | Status: AC
  Filled 2022-12-09: qty 1

## 2022-12-09 MED ORDER — AMIODARONE HCL IN DEXTROSE 360-4.14 MG/200ML-% IV SOLN
30.0000 mg/h | INTRAVENOUS | Status: DC
  Administered 2022-12-09 – 2022-12-12 (×6): 30 mg/h via INTRAVENOUS
  Filled 2022-12-09 (×6): qty 200

## 2022-12-09 MED ORDER — MIDAZOLAM HCL 2 MG/2ML IJ SOLN
INTRAMUSCULAR | Status: AC
  Filled 2022-12-09: qty 2

## 2022-12-09 MED ORDER — SODIUM CHLORIDE 0.9% IV SOLUTION: Freq: Once | INTRAVENOUS | Status: AC

## 2022-12-09 MED ORDER — LIDOCAINE-EPINEPHRINE 1 %-1:100000 IJ SOLN
INTRAMUSCULAR | Status: AC
  Filled 2022-12-09: qty 1

## 2022-12-09 MED ORDER — GELATIN ABSORBABLE 12-7 MM EX MISC
CUTANEOUS | Status: AC
  Filled 2022-12-09: qty 2

## 2022-12-09 MED ORDER — OXYCODONE HCL 5 MG PO TABS
5.0000 mg | ORAL_TABLET | ORAL | Status: DC | PRN
  Administered 2022-12-09 (×2): 5 mg via ORAL
  Filled 2022-12-09 (×2): qty 1

## 2022-12-09 MED ORDER — MIDAZOLAM HCL 2 MG/2ML IJ SOLN
INTRAMUSCULAR | Status: AC | PRN
Start: 2022-12-09 — End: 2022-12-09
  Administered 2022-12-09: .5 mg via INTRAVENOUS
  Administered 2022-12-09: 1 mg via INTRAVENOUS

## 2022-12-09 MED ORDER — LATANOPROST 0.005 % OP SOLN
1.0000 [drp] | Freq: Every day | OPHTHALMIC | Status: DC
  Administered 2022-12-09 – 2022-12-11 (×3): 1 [drp] via OPHTHALMIC
  Filled 2022-12-09: qty 2.5

## 2022-12-09 MED ORDER — ORAL CARE MOUTH RINSE: 15.0000 mL | OROMUCOSAL | Status: DC | PRN

## 2022-12-09 MED ORDER — PANTOPRAZOLE SODIUM 40 MG PO TBEC: 40.0000 mg | DELAYED_RELEASE_TABLET | Freq: Every day | ORAL | Status: DC

## 2022-12-09 MED ORDER — PHENYLEPHRINE HCL-NACL 20-0.9 MG/250ML-% IV SOLN
INTRAVENOUS | Status: AC
  Administered 2022-12-09: 25 ug/min via INTRAVENOUS
  Filled 2022-12-09: qty 250

## 2022-12-09 MED ORDER — AMIODARONE HCL IN DEXTROSE 360-4.14 MG/200ML-% IV SOLN
60.0000 mg/h | INTRAVENOUS | Status: AC
  Administered 2022-12-09: 60 mg/h via INTRAVENOUS
  Filled 2022-12-09: qty 200

## 2022-12-09 MED ORDER — CEFAZOLIN SODIUM-DEXTROSE 2-4 GM/100ML-% IV SOLN
INTRAVENOUS | Status: AC | PRN
Start: 2022-12-09 — End: 2022-12-09
  Administered 2022-12-09: 2 g via INTRAVENOUS
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
## 2022-12-09 NOTE — ED Notes (Signed)
Trauma providers at bedside and CCM at bedside.

## 2022-12-09 NOTE — Progress Notes (Signed)
eLink Physician-Brief Progress Note Patient Name: Tamara Larson DOB: July 31, 1940 MRN: [id number]   Date of Service  12/09/2022  HPI/Events of Note  Camera eval done for persisting lower back pain, which is chronic, takes steroids at home. Now aggravated by fall, hemoperitoneum, anemia( stable > 9).  Already has prn narcotics. Eliquis on hold. Not due for oxycodone q4 hrs. Received morphine 2 mg few hrs ago.   On nasal o2, alert and awake, not confused. VS stable.   eICU Interventions  Morphine 1 mg IV ordered stat Asp precautions Follow Hg closely     Intervention Category Intermediate Interventions: Pain - evaluation and management  Ranee Gosselin 12/09/2022, 7:39 PM

## 2022-12-09 NOTE — ED Notes (Signed)
Second blood culture drawn after maxipime and flagyl were initiated.

## 2022-12-09 NOTE — Progress Notes (Signed)
Hb after 1 U PRBC was 6.1 Will transfuse 2 more U pRBC & start PIV neo gtt Trauma to follow  Taja Pentland V. Vassie Loll MD

## 2022-12-09 NOTE — Procedures (Signed)
Vascular and Interventional Radiology Procedure Note  Patient: Tamara Larson DOB: [birth year] Medical Record Number: [id number] Note Date/Time: 12/09/22 12:31 PM   Performing Physician: Roanna Banning, MD Assistant(s): None  Diagnosis: Trauma. Pelvic mass with hemoperitoneum. Hypotension.  Procedure(s):  PELVIC ARTERIOGRAPHY  INTERNAL ILIAC ARTERY EMBOLIZATION for HEMORRHAGIC PELVIC MASS   Anesthesia: Conscious Sedation Complications: None Estimated Blood Loss: Minimal Specimens: None  Findings:  - access via the RIGHT femoral artery. - No active extravasation within the pelvis - Successful embolization with Gelfoam to stasis. - AngioSeal closure at the R groin with distal RLE pulses at the end of the case.  Plan: - Post sheath removal precautions.  - Bedrest with RLE straight x2hrs.  Final report to follow once all images are reviewed and compared with previous studies.  See detailed dictation with images in PACS. The patient tolerated the procedure well without incident or complication and was returned to ICU in stable condition.    Roanna Banning, MD [facility]   Pager. [phone] Clinic. [phone]

## 2022-12-09 NOTE — Consult Note (Signed)
mass lesion/mass effect. Vascular: No hyperdense vessel or unexpected calcification. Skull: Normal. Negative for fracture or focal lesion. Other: None. CT MAXILLOFACIAL FINDINGS Osseous: Fracture of the left orbit with fractures of the medial and inferior walls. Herniation of fat into the left maxillary sinus through the inferior floor defect. Orbits: The globes are intact.  No postseptal hematoma.  Sinuses: About products in the left maxillary sinus. The sinuses are otherwise well aerated. No mastoid effusion. Soft tissues: Left forehead/periorbital hematoma. CT CERVICAL SPINE FINDINGS Alignment: No evidence of traumatic malalignment. Skull base and vertebrae: No acute fracture. Soft tissues and spinal canal: Thickening of the prevertebral soft tissues due to medialized carotid arteries posterior to the esophagus. No prevertebral swelling or hematoma. Disc levels: Age-related spondylosis greatest at C4-C5 and C5-C6 where it is moderate. Posterior disc osteophyte complex at C4-C5 and C5-C6 cause mild effacement the ventral thecal sac. No severe spinal canal narrowing. Other: Carotid calcification. CT CHEST FINDINGS Cardiovascular: No pericardial effusion. No evidence of aortic injury. Coronary artery and aortic atherosclerotic calcification. Mediastinum/Nodes: Trachea and esophagus are unremarkable. No mediastinal hematoma. Lungs/Pleura: Scattered scarring/atelectasis. No focal consolidation, pleural effusion, or pneumothorax. Musculoskeletal: No acute fracture.  Remote fracture of the sternum. CT ABDOMEN PELVIS FINDINGS Hepatobiliary: No hepatic laceration or hematoma. Hepatic cysts. Unremarkable gallbladder and biliary tree. Pancreas: Unremarkable. Spleen: No splenic laceration or hematoma. Adrenals/Urinary Tract: No adrenal hemorrhage. No renal laceration or hematoma. Low-attenuation lesions in the kidneys are statistically likely to represent cysts. No follow-up is required. Unremarkable bladder. Stomach/Bowel: Normal caliber large and small bowel. Mild wall thickening of a loop of small bowel in the central mesentery likely reactive secondary to adjacent blood products. Decompressed stomach. There is some loss definition of the lateral wall of the gastric fundus (circa series 6/image 96). This is favored to represent silhouetting due to adjacent hemorrhage rather than perforation as there is no free  intraperitoneal air. Colonic diverticulosis without diverticulitis. Vascular/Lymphatic: Aortic atherosclerosis. No enlarged abdominal or pelvic lymph nodes. Reproductive: Large heterogenous pelvic mass measures 11.9 x 12.4 x 14.9 cm. This exerts mass effect on the uterus on lateral view. No discrete ovaries are seen. Other: Moderate-to-large hemoperitoneum greatest about the upper abdomen. The source of the hemorrhage is presumably the pelvic mass. No free intraperitoneal air. Musculoskeletal: No acute fracture. CT LUMBAR SPINE Segmentation: 5 lumbar type vertebrae. Alignment: No evidence of traumatic malalignment. Vertebrae: No acute fracture. Paraspinal and other soft tissues: See above. Disc levels: Decompressive laminectomy at L3-L4 and L4-L5. Multilevel advanced spondylosis and facet arthropathy. IMPRESSION: 1. No acute intracranial abnormality. 2. Acute blow out fracture of the left orbit with fractures of the medial and inferior walls. Herniation of fat through the inferior wall. Correlate for entrapment. 3. Left forehead/periorbital hematoma. 4. No acute fracture in the cervical spine. 5. No acute traumatic injury in the chest. 6. Large heterogenous pelvic mass measuring 11.9 x 12.4 x 14.9 cm likely adnexal or possibly uterine in origin. 7. Moderate-to-large hemoperitoneum greatest about the upper abdomen. The source of the hemorrhage is presumably the pelvic mass. 8. No acute fracture in the lumbar spine. Aortic Atherosclerosis (ICD10-I70.0). Critical Value/emergent results were called by telephone at the time of interpretation on 12/09/2022 at 11:51 pm to provider Dr Andrey Campanile, who verbally acknowledged these results. Electronically Signed   By: Minerva Fester M.D.   On: 12/09/2022 00:26   CT L-SPINE NO CHARGE  Result Date: 12/09/2022 CLINICAL DATA:  Ground level fall on blood thinners EXAM: CT HEAD WITHOUT CONTRAST CT MAXILLOFACIAL WITHOUT  Mediastinum/Nodes: Trachea and esophagus are unremarkable. No mediastinal hematoma. Lungs/Pleura: Scattered scarring/atelectasis. No focal consolidation, pleural effusion, or pneumothorax. Musculoskeletal: No acute fracture.  Remote fracture of the sternum. CT ABDOMEN PELVIS FINDINGS Hepatobiliary: No hepatic laceration or hematoma. Hepatic cysts. Unremarkable gallbladder and biliary tree. Pancreas: Unremarkable. Spleen: No splenic laceration or hematoma. Adrenals/Urinary Tract: No adrenal hemorrhage. No renal laceration or hematoma. Low-attenuation lesions in the kidneys are statistically likely to represent cysts. No follow-up is required. Unremarkable bladder. Stomach/Bowel: Normal caliber large and small bowel. Mild wall thickening of a loop of  small bowel in the central mesentery likely reactive secondary to adjacent blood products. Decompressed stomach. There is some loss definition of the lateral wall of the gastric fundus (circa series 6/image 96). This is favored to represent silhouetting due to adjacent hemorrhage rather than perforation as there is no free intraperitoneal air. Colonic diverticulosis without diverticulitis. Vascular/Lymphatic: Aortic atherosclerosis. No enlarged abdominal or pelvic lymph nodes. Reproductive: Large heterogenous pelvic mass measures 11.9 x 12.4 x 14.9 cm. This exerts mass effect on the uterus on lateral view. No discrete ovaries are seen. Other: Moderate-to-large hemoperitoneum greatest about the upper abdomen. The source of the hemorrhage is presumably the pelvic mass. No free intraperitoneal air. Musculoskeletal: No acute fracture. CT LUMBAR SPINE Segmentation: 5 lumbar type vertebrae. Alignment: No evidence of traumatic malalignment. Vertebrae: No acute fracture. Paraspinal and other soft tissues: See above. Disc levels: Decompressive laminectomy at L3-L4 and L4-L5. Multilevel advanced spondylosis and facet arthropathy. IMPRESSION: 1. No acute intracranial abnormality. 2. Acute blow out fracture of the left orbit with fractures of the medial and inferior walls. Herniation of fat through the inferior wall. Correlate for entrapment. 3. Left forehead/periorbital hematoma. 4. No acute fracture in the cervical spine. 5. No acute traumatic injury in the chest. 6. Large heterogenous pelvic mass measuring 11.9 x 12.4 x 14.9 cm likely adnexal or possibly uterine in origin. 7. Moderate-to-large hemoperitoneum greatest about the upper abdomen. The source of the hemorrhage is presumably the pelvic mass. 8. No acute fracture in the lumbar spine. Aortic Atherosclerosis (ICD10-I70.0). Critical Value/emergent results were called by telephone at the time of interpretation on 12/26/2022 at 11:51 pm to provider Dr Andrey Campanile, who verbally  acknowledged these results. Electronically Signed   By: Minerva Fester M.D.   On: 12/09/2022 00:26   CT CHEST ABDOMEN PELVIS W CONTRAST  Result Date: 12/09/2022 CLINICAL DATA:  Ground level fall on blood thinners EXAM: CT HEAD WITHOUT CONTRAST CT MAXILLOFACIAL WITHOUT CONTRAST CT CERVICAL SPINE WITHOUT CONTRAST CT CHEST, ABDOMEN AND PELVIS WITH CONTRAST TECHNIQUE: Contiguous axial images were obtained from the base of the skull through the vertex without intravenous contrast. Multidetector CT imaging of the maxillofacial structures was performed. Multiplanar CT image reconstructions were also generated. A small metallic BB was placed on the right temple in order to reliably differentiate right from left. Multidetector CT imaging of the cervical spine was performed without intravenous contrast. Multiplanar CT image reconstructions were also generated. Multidetector CT imaging of the chest, abdomen and pelvis was performed following the standard protocol during bolus administration of intravenous contrast. RADIATION DOSE REDUCTION: This exam was performed according to the departmental dose-optimization program which includes automated exposure control, adjustment of the mA and/or kV according to patient size and/or use of iterative reconstruction technique. CONTRAST:  75mL OMNIPAQUE IOHEXOL 350 MG/ML SOLN COMPARISON:  Renal ultrasound 11/17/2022; CT head and cervical spine 11/18/2018; CT 12/22/2012 FINDINGS: CT HEAD FINDINGS Brain: No evidence of acute infarction, hemorrhage, hydrocephalus, extra-axial collection or  mass lesion/mass effect. Vascular: No hyperdense vessel or unexpected calcification. Skull: Normal. Negative for fracture or focal lesion. Other: None. CT MAXILLOFACIAL FINDINGS Osseous: Fracture of the left orbit with fractures of the medial and inferior walls. Herniation of fat into the left maxillary sinus through the inferior floor defect. Orbits: The globes are intact.  No postseptal hematoma.  Sinuses: About products in the left maxillary sinus. The sinuses are otherwise well aerated. No mastoid effusion. Soft tissues: Left forehead/periorbital hematoma. CT CERVICAL SPINE FINDINGS Alignment: No evidence of traumatic malalignment. Skull base and vertebrae: No acute fracture. Soft tissues and spinal canal: Thickening of the prevertebral soft tissues due to medialized carotid arteries posterior to the esophagus. No prevertebral swelling or hematoma. Disc levels: Age-related spondylosis greatest at C4-C5 and C5-C6 where it is moderate. Posterior disc osteophyte complex at C4-C5 and C5-C6 cause mild effacement the ventral thecal sac. No severe spinal canal narrowing. Other: Carotid calcification. CT CHEST FINDINGS Cardiovascular: No pericardial effusion. No evidence of aortic injury. Coronary artery and aortic atherosclerotic calcification. Mediastinum/Nodes: Trachea and esophagus are unremarkable. No mediastinal hematoma. Lungs/Pleura: Scattered scarring/atelectasis. No focal consolidation, pleural effusion, or pneumothorax. Musculoskeletal: No acute fracture.  Remote fracture of the sternum. CT ABDOMEN PELVIS FINDINGS Hepatobiliary: No hepatic laceration or hematoma. Hepatic cysts. Unremarkable gallbladder and biliary tree. Pancreas: Unremarkable. Spleen: No splenic laceration or hematoma. Adrenals/Urinary Tract: No adrenal hemorrhage. No renal laceration or hematoma. Low-attenuation lesions in the kidneys are statistically likely to represent cysts. No follow-up is required. Unremarkable bladder. Stomach/Bowel: Normal caliber large and small bowel. Mild wall thickening of a loop of small bowel in the central mesentery likely reactive secondary to adjacent blood products. Decompressed stomach. There is some loss definition of the lateral wall of the gastric fundus (circa series 6/image 96). This is favored to represent silhouetting due to adjacent hemorrhage rather than perforation as there is no free  intraperitoneal air. Colonic diverticulosis without diverticulitis. Vascular/Lymphatic: Aortic atherosclerosis. No enlarged abdominal or pelvic lymph nodes. Reproductive: Large heterogenous pelvic mass measures 11.9 x 12.4 x 14.9 cm. This exerts mass effect on the uterus on lateral view. No discrete ovaries are seen. Other: Moderate-to-large hemoperitoneum greatest about the upper abdomen. The source of the hemorrhage is presumably the pelvic mass. No free intraperitoneal air. Musculoskeletal: No acute fracture. CT LUMBAR SPINE Segmentation: 5 lumbar type vertebrae. Alignment: No evidence of traumatic malalignment. Vertebrae: No acute fracture. Paraspinal and other soft tissues: See above. Disc levels: Decompressive laminectomy at L3-L4 and L4-L5. Multilevel advanced spondylosis and facet arthropathy. IMPRESSION: 1. No acute intracranial abnormality. 2. Acute blow out fracture of the left orbit with fractures of the medial and inferior walls. Herniation of fat through the inferior wall. Correlate for entrapment. 3. Left forehead/periorbital hematoma. 4. No acute fracture in the cervical spine. 5. No acute traumatic injury in the chest. 6. Large heterogenous pelvic mass measuring 11.9 x 12.4 x 14.9 cm likely adnexal or possibly uterine in origin. 7. Moderate-to-large hemoperitoneum greatest about the upper abdomen. The source of the hemorrhage is presumably the pelvic mass. 8. No acute fracture in the lumbar spine. Aortic Atherosclerosis (ICD10-I70.0). Critical Value/emergent results were called by telephone at the time of interpretation on 12/09/2022 at 11:51 pm to provider Dr Andrey Campanile, who verbally acknowledged these results. Electronically Signed   By: Minerva Fester M.D.   On: 12/09/2022 00:26   CT L-SPINE NO CHARGE  Result Date: 12/09/2022 CLINICAL DATA:  Ground level fall on blood thinners EXAM: CT HEAD WITHOUT CONTRAST CT MAXILLOFACIAL WITHOUT  and L4-L5. Multilevel advanced spondylosis and facet arthropathy. IMPRESSION: 1. No acute intracranial abnormality. 2. Acute blow out fracture of the left orbit with fractures of the medial and inferior walls. Herniation of fat through the inferior wall. Correlate for entrapment. 3. Left forehead/periorbital hematoma. 4. No acute fracture in the cervical spine. 5. No acute traumatic injury in the chest. 6. Large heterogenous pelvic mass measuring 11.9 x 12.4 x 14.9 cm likely adnexal or possibly uterine in origin. 7. Moderate-to-large hemoperitoneum greatest about the upper abdomen. The source of the hemorrhage is presumably the pelvic mass. 8. No acute fracture in the lumbar spine. Aortic Atherosclerosis (ICD10-I70.0). Critical Value/emergent results were called by telephone at the time of interpretation on 12/26/2022 at 11:51 pm to provider Dr Andrey Campanile, who verbally acknowledged these results. Electronically Signed   By: Minerva Fester M.D.   On: 12/09/2022 00:26   DG Pelvis Portable  Result Date: 12/05/2022 CLINICAL DATA:  Trauma, fall.  Pain. EXAM: PORTABLE PELVIS 1-2 VIEWS COMPARISON:  None Available. FINDINGS: The bones are under mineralized. The cortical margins of the bony pelvis are intact. No fracture. Pubic symphysis and sacroiliac joints are congruent. Both femoral heads are well-seated in the respective acetabula. Mild bilateral hip osteoarthritis. IMPRESSION: No pelvic fracture. Electronically Signed   By: Narda Rutherford M.D.   On: 12/04/2022 23:59   DG Chest Port 1 View  Result  Date: 12/19/2022 CLINICAL DATA:  Trauma, fall. EXAM: PORTABLE CHEST 1 VIEW COMPARISON:  Subsequent chest CT available at time of radiograph interpretation. FINDINGS: The cardiomediastinal contours are normal. Subsegmental atelectasis at the right lung base with elevated hemidiaphragm. Pulmonary vasculature is normal. No consolidation, pleural effusion, or pneumothorax. No acute osseous abnormalities are seen. IMPRESSION: Subsegmental atelectasis at the right lung base with elevated hemidiaphragm. Electronically Signed   By: Narda Rutherford M.D.   On: 12/07/2022 23:58   MM 3D SCREENING MAMMOGRAM BILATERAL BREAST  Result Date: 12/07/2022 CLINICAL DATA:  Screening. EXAM: DIGITAL SCREENING BILATERAL MAMMOGRAM WITH TOMOSYNTHESIS AND CAD TECHNIQUE: Bilateral screening digital craniocaudal and mediolateral oblique mammograms were obtained. Bilateral screening digital breast tomosynthesis was performed. The images were evaluated with computer-aided detection. COMPARISON:  Previous exam(s). ACR Breast Density Category b: There are scattered areas of fibroglandular density. FINDINGS: There are no findings suspicious for malignancy. IMPRESSION: No mammographic evidence of malignancy. A result letter of this screening mammogram will be mailed directly to the patient. RECOMMENDATION: Screening mammogram in one year. (Code:[id number]) BI-RADS CATEGORY  1: Negative. Electronically Signed   By: Norva Pavlov M.D.   On: 12/07/2022 14:55   US RENAL  Result Date: 12/05/2022 CLINICAL DATA:  Impaired renal function. EXAM: RENAL / URINARY TRACT ULTRASOUND COMPLETE COMPARISON:  None Available. FINDINGS: Right Kidney: Length: 9.3 cm. Echogenicity within normal limits. No hydronephrosis. Cyst upper pole measures 1.2 cm. Left Kidney: Length: 10.8 cm. Echogenicity within normal limits. Midpole parapelvic cyst measures 5 cm. Upper pole complex cyst versus solid nodule measures 3.6 cm. Upper pole cyst measures 6.3 cm. Bladder:  Suboptimally visualized. Incidental note made of a complex cystic hypervascular suspicious mass in the pelvis on the right measuring 13 x 12 x 8 cm. IMPRESSION: 1. Bilateral renal cysts. 2. Indeterminate mass left kidney. 3. Suspicious 13 cm mass in the pelvis. 4. Further evaluation recommended with CT of the abdomen and pelvis with oral and IV contrast to include pre- and postcontrast thin section images of the kidneys. Electronically Signed   By: Layla Maw M.D.   On: 12/05/2022 08:35  mass lesion/mass effect. Vascular: No hyperdense vessel or unexpected calcification. Skull: Normal. Negative for fracture or focal lesion. Other: None. CT MAXILLOFACIAL FINDINGS Osseous: Fracture of the left orbit with fractures of the medial and inferior walls. Herniation of fat into the left maxillary sinus through the inferior floor defect. Orbits: The globes are intact.  No postseptal hematoma.  Sinuses: About products in the left maxillary sinus. The sinuses are otherwise well aerated. No mastoid effusion. Soft tissues: Left forehead/periorbital hematoma. CT CERVICAL SPINE FINDINGS Alignment: No evidence of traumatic malalignment. Skull base and vertebrae: No acute fracture. Soft tissues and spinal canal: Thickening of the prevertebral soft tissues due to medialized carotid arteries posterior to the esophagus. No prevertebral swelling or hematoma. Disc levels: Age-related spondylosis greatest at C4-C5 and C5-C6 where it is moderate. Posterior disc osteophyte complex at C4-C5 and C5-C6 cause mild effacement the ventral thecal sac. No severe spinal canal narrowing. Other: Carotid calcification. CT CHEST FINDINGS Cardiovascular: No pericardial effusion. No evidence of aortic injury. Coronary artery and aortic atherosclerotic calcification. Mediastinum/Nodes: Trachea and esophagus are unremarkable. No mediastinal hematoma. Lungs/Pleura: Scattered scarring/atelectasis. No focal consolidation, pleural effusion, or pneumothorax. Musculoskeletal: No acute fracture.  Remote fracture of the sternum. CT ABDOMEN PELVIS FINDINGS Hepatobiliary: No hepatic laceration or hematoma. Hepatic cysts. Unremarkable gallbladder and biliary tree. Pancreas: Unremarkable. Spleen: No splenic laceration or hematoma. Adrenals/Urinary Tract: No adrenal hemorrhage. No renal laceration or hematoma. Low-attenuation lesions in the kidneys are statistically likely to represent cysts. No follow-up is required. Unremarkable bladder. Stomach/Bowel: Normal caliber large and small bowel. Mild wall thickening of a loop of small bowel in the central mesentery likely reactive secondary to adjacent blood products. Decompressed stomach. There is some loss definition of the lateral wall of the gastric fundus (circa series 6/image 96). This is favored to represent silhouetting due to adjacent hemorrhage rather than perforation as there is no free  intraperitoneal air. Colonic diverticulosis without diverticulitis. Vascular/Lymphatic: Aortic atherosclerosis. No enlarged abdominal or pelvic lymph nodes. Reproductive: Large heterogenous pelvic mass measures 11.9 x 12.4 x 14.9 cm. This exerts mass effect on the uterus on lateral view. No discrete ovaries are seen. Other: Moderate-to-large hemoperitoneum greatest about the upper abdomen. The source of the hemorrhage is presumably the pelvic mass. No free intraperitoneal air. Musculoskeletal: No acute fracture. CT LUMBAR SPINE Segmentation: 5 lumbar type vertebrae. Alignment: No evidence of traumatic malalignment. Vertebrae: No acute fracture. Paraspinal and other soft tissues: See above. Disc levels: Decompressive laminectomy at L3-L4 and L4-L5. Multilevel advanced spondylosis and facet arthropathy. IMPRESSION: 1. No acute intracranial abnormality. 2. Acute blow out fracture of the left orbit with fractures of the medial and inferior walls. Herniation of fat through the inferior wall. Correlate for entrapment. 3. Left forehead/periorbital hematoma. 4. No acute fracture in the cervical spine. 5. No acute traumatic injury in the chest. 6. Large heterogenous pelvic mass measuring 11.9 x 12.4 x 14.9 cm likely adnexal or possibly uterine in origin. 7. Moderate-to-large hemoperitoneum greatest about the upper abdomen. The source of the hemorrhage is presumably the pelvic mass. 8. No acute fracture in the lumbar spine. Aortic Atherosclerosis (ICD10-I70.0). Critical Value/emergent results were called by telephone at the time of interpretation on 12/09/2022 at 11:51 pm to provider Dr Andrey Campanile, who verbally acknowledged these results. Electronically Signed   By: Minerva Fester M.D.   On: 12/09/2022 00:26   CT L-SPINE NO CHARGE  Result Date: 12/09/2022 CLINICAL DATA:  Ground level fall on blood thinners EXAM: CT HEAD WITHOUT CONTRAST CT MAXILLOFACIAL WITHOUT  mass lesion/mass effect. Vascular: No hyperdense vessel or unexpected calcification. Skull: Normal. Negative for fracture or focal lesion. Other: None. CT MAXILLOFACIAL FINDINGS Osseous: Fracture of the left orbit with fractures of the medial and inferior walls. Herniation of fat into the left maxillary sinus through the inferior floor defect. Orbits: The globes are intact.  No postseptal hematoma.  Sinuses: About products in the left maxillary sinus. The sinuses are otherwise well aerated. No mastoid effusion. Soft tissues: Left forehead/periorbital hematoma. CT CERVICAL SPINE FINDINGS Alignment: No evidence of traumatic malalignment. Skull base and vertebrae: No acute fracture. Soft tissues and spinal canal: Thickening of the prevertebral soft tissues due to medialized carotid arteries posterior to the esophagus. No prevertebral swelling or hematoma. Disc levels: Age-related spondylosis greatest at C4-C5 and C5-C6 where it is moderate. Posterior disc osteophyte complex at C4-C5 and C5-C6 cause mild effacement the ventral thecal sac. No severe spinal canal narrowing. Other: Carotid calcification. CT CHEST FINDINGS Cardiovascular: No pericardial effusion. No evidence of aortic injury. Coronary artery and aortic atherosclerotic calcification. Mediastinum/Nodes: Trachea and esophagus are unremarkable. No mediastinal hematoma. Lungs/Pleura: Scattered scarring/atelectasis. No focal consolidation, pleural effusion, or pneumothorax. Musculoskeletal: No acute fracture.  Remote fracture of the sternum. CT ABDOMEN PELVIS FINDINGS Hepatobiliary: No hepatic laceration or hematoma. Hepatic cysts. Unremarkable gallbladder and biliary tree. Pancreas: Unremarkable. Spleen: No splenic laceration or hematoma. Adrenals/Urinary Tract: No adrenal hemorrhage. No renal laceration or hematoma. Low-attenuation lesions in the kidneys are statistically likely to represent cysts. No follow-up is required. Unremarkable bladder. Stomach/Bowel: Normal caliber large and small bowel. Mild wall thickening of a loop of small bowel in the central mesentery likely reactive secondary to adjacent blood products. Decompressed stomach. There is some loss definition of the lateral wall of the gastric fundus (circa series 6/image 96). This is favored to represent silhouetting due to adjacent hemorrhage rather than perforation as there is no free  intraperitoneal air. Colonic diverticulosis without diverticulitis. Vascular/Lymphatic: Aortic atherosclerosis. No enlarged abdominal or pelvic lymph nodes. Reproductive: Large heterogenous pelvic mass measures 11.9 x 12.4 x 14.9 cm. This exerts mass effect on the uterus on lateral view. No discrete ovaries are seen. Other: Moderate-to-large hemoperitoneum greatest about the upper abdomen. The source of the hemorrhage is presumably the pelvic mass. No free intraperitoneal air. Musculoskeletal: No acute fracture. CT LUMBAR SPINE Segmentation: 5 lumbar type vertebrae. Alignment: No evidence of traumatic malalignment. Vertebrae: No acute fracture. Paraspinal and other soft tissues: See above. Disc levels: Decompressive laminectomy at L3-L4 and L4-L5. Multilevel advanced spondylosis and facet arthropathy. IMPRESSION: 1. No acute intracranial abnormality. 2. Acute blow out fracture of the left orbit with fractures of the medial and inferior walls. Herniation of fat through the inferior wall. Correlate for entrapment. 3. Left forehead/periorbital hematoma. 4. No acute fracture in the cervical spine. 5. No acute traumatic injury in the chest. 6. Large heterogenous pelvic mass measuring 11.9 x 12.4 x 14.9 cm likely adnexal or possibly uterine in origin. 7. Moderate-to-large hemoperitoneum greatest about the upper abdomen. The source of the hemorrhage is presumably the pelvic mass. 8. No acute fracture in the lumbar spine. Aortic Atherosclerosis (ICD10-I70.0). Critical Value/emergent results were called by telephone at the time of interpretation on 12/09/2022 at 11:51 pm to provider Dr Andrey Campanile, who verbally acknowledged these results. Electronically Signed   By: Minerva Fester M.D.   On: 12/09/2022 00:26   CT L-SPINE NO CHARGE  Result Date: 12/09/2022 CLINICAL DATA:  Ground level fall on blood thinners EXAM: CT HEAD WITHOUT CONTRAST CT MAXILLOFACIAL WITHOUT  Mediastinum/Nodes: Trachea and esophagus are unremarkable. No mediastinal hematoma. Lungs/Pleura: Scattered scarring/atelectasis. No focal consolidation, pleural effusion, or pneumothorax. Musculoskeletal: No acute fracture.  Remote fracture of the sternum. CT ABDOMEN PELVIS FINDINGS Hepatobiliary: No hepatic laceration or hematoma. Hepatic cysts. Unremarkable gallbladder and biliary tree. Pancreas: Unremarkable. Spleen: No splenic laceration or hematoma. Adrenals/Urinary Tract: No adrenal hemorrhage. No renal laceration or hematoma. Low-attenuation lesions in the kidneys are statistically likely to represent cysts. No follow-up is required. Unremarkable bladder. Stomach/Bowel: Normal caliber large and small bowel. Mild wall thickening of a loop of  small bowel in the central mesentery likely reactive secondary to adjacent blood products. Decompressed stomach. There is some loss definition of the lateral wall of the gastric fundus (circa series 6/image 96). This is favored to represent silhouetting due to adjacent hemorrhage rather than perforation as there is no free intraperitoneal air. Colonic diverticulosis without diverticulitis. Vascular/Lymphatic: Aortic atherosclerosis. No enlarged abdominal or pelvic lymph nodes. Reproductive: Large heterogenous pelvic mass measures 11.9 x 12.4 x 14.9 cm. This exerts mass effect on the uterus on lateral view. No discrete ovaries are seen. Other: Moderate-to-large hemoperitoneum greatest about the upper abdomen. The source of the hemorrhage is presumably the pelvic mass. No free intraperitoneal air. Musculoskeletal: No acute fracture. CT LUMBAR SPINE Segmentation: 5 lumbar type vertebrae. Alignment: No evidence of traumatic malalignment. Vertebrae: No acute fracture. Paraspinal and other soft tissues: See above. Disc levels: Decompressive laminectomy at L3-L4 and L4-L5. Multilevel advanced spondylosis and facet arthropathy. IMPRESSION: 1. No acute intracranial abnormality. 2. Acute blow out fracture of the left orbit with fractures of the medial and inferior walls. Herniation of fat through the inferior wall. Correlate for entrapment. 3. Left forehead/periorbital hematoma. 4. No acute fracture in the cervical spine. 5. No acute traumatic injury in the chest. 6. Large heterogenous pelvic mass measuring 11.9 x 12.4 x 14.9 cm likely adnexal or possibly uterine in origin. 7. Moderate-to-large hemoperitoneum greatest about the upper abdomen. The source of the hemorrhage is presumably the pelvic mass. 8. No acute fracture in the lumbar spine. Aortic Atherosclerosis (ICD10-I70.0). Critical Value/emergent results were called by telephone at the time of interpretation on 12/26/2022 at 11:51 pm to provider Dr Andrey Campanile, who verbally  acknowledged these results. Electronically Signed   By: Minerva Fester M.D.   On: 12/09/2022 00:26   CT CHEST ABDOMEN PELVIS W CONTRAST  Result Date: 12/09/2022 CLINICAL DATA:  Ground level fall on blood thinners EXAM: CT HEAD WITHOUT CONTRAST CT MAXILLOFACIAL WITHOUT CONTRAST CT CERVICAL SPINE WITHOUT CONTRAST CT CHEST, ABDOMEN AND PELVIS WITH CONTRAST TECHNIQUE: Contiguous axial images were obtained from the base of the skull through the vertex without intravenous contrast. Multidetector CT imaging of the maxillofacial structures was performed. Multiplanar CT image reconstructions were also generated. A small metallic BB was placed on the right temple in order to reliably differentiate right from left. Multidetector CT imaging of the cervical spine was performed without intravenous contrast. Multiplanar CT image reconstructions were also generated. Multidetector CT imaging of the chest, abdomen and pelvis was performed following the standard protocol during bolus administration of intravenous contrast. RADIATION DOSE REDUCTION: This exam was performed according to the departmental dose-optimization program which includes automated exposure control, adjustment of the mA and/or kV according to patient size and/or use of iterative reconstruction technique. CONTRAST:  75mL OMNIPAQUE IOHEXOL 350 MG/ML SOLN COMPARISON:  Renal ultrasound 11/17/2022; CT head and cervical spine 11/18/2018; CT 12/22/2012 FINDINGS: CT HEAD FINDINGS Brain: No evidence of acute infarction, hemorrhage, hydrocephalus, extra-axial collection or  mass lesion/mass effect. Vascular: No hyperdense vessel or unexpected calcification. Skull: Normal. Negative for fracture or focal lesion. Other: None. CT MAXILLOFACIAL FINDINGS Osseous: Fracture of the left orbit with fractures of the medial and inferior walls. Herniation of fat into the left maxillary sinus through the inferior floor defect. Orbits: The globes are intact.  No postseptal hematoma.  Sinuses: About products in the left maxillary sinus. The sinuses are otherwise well aerated. No mastoid effusion. Soft tissues: Left forehead/periorbital hematoma. CT CERVICAL SPINE FINDINGS Alignment: No evidence of traumatic malalignment. Skull base and vertebrae: No acute fracture. Soft tissues and spinal canal: Thickening of the prevertebral soft tissues due to medialized carotid arteries posterior to the esophagus. No prevertebral swelling or hematoma. Disc levels: Age-related spondylosis greatest at C4-C5 and C5-C6 where it is moderate. Posterior disc osteophyte complex at C4-C5 and C5-C6 cause mild effacement the ventral thecal sac. No severe spinal canal narrowing. Other: Carotid calcification. CT CHEST FINDINGS Cardiovascular: No pericardial effusion. No evidence of aortic injury. Coronary artery and aortic atherosclerotic calcification. Mediastinum/Nodes: Trachea and esophagus are unremarkable. No mediastinal hematoma. Lungs/Pleura: Scattered scarring/atelectasis. No focal consolidation, pleural effusion, or pneumothorax. Musculoskeletal: No acute fracture.  Remote fracture of the sternum. CT ABDOMEN PELVIS FINDINGS Hepatobiliary: No hepatic laceration or hematoma. Hepatic cysts. Unremarkable gallbladder and biliary tree. Pancreas: Unremarkable. Spleen: No splenic laceration or hematoma. Adrenals/Urinary Tract: No adrenal hemorrhage. No renal laceration or hematoma. Low-attenuation lesions in the kidneys are statistically likely to represent cysts. No follow-up is required. Unremarkable bladder. Stomach/Bowel: Normal caliber large and small bowel. Mild wall thickening of a loop of small bowel in the central mesentery likely reactive secondary to adjacent blood products. Decompressed stomach. There is some loss definition of the lateral wall of the gastric fundus (circa series 6/image 96). This is favored to represent silhouetting due to adjacent hemorrhage rather than perforation as there is no free  intraperitoneal air. Colonic diverticulosis without diverticulitis. Vascular/Lymphatic: Aortic atherosclerosis. No enlarged abdominal or pelvic lymph nodes. Reproductive: Large heterogenous pelvic mass measures 11.9 x 12.4 x 14.9 cm. This exerts mass effect on the uterus on lateral view. No discrete ovaries are seen. Other: Moderate-to-large hemoperitoneum greatest about the upper abdomen. The source of the hemorrhage is presumably the pelvic mass. No free intraperitoneal air. Musculoskeletal: No acute fracture. CT LUMBAR SPINE Segmentation: 5 lumbar type vertebrae. Alignment: No evidence of traumatic malalignment. Vertebrae: No acute fracture. Paraspinal and other soft tissues: See above. Disc levels: Decompressive laminectomy at L3-L4 and L4-L5. Multilevel advanced spondylosis and facet arthropathy. IMPRESSION: 1. No acute intracranial abnormality. 2. Acute blow out fracture of the left orbit with fractures of the medial and inferior walls. Herniation of fat through the inferior wall. Correlate for entrapment. 3. Left forehead/periorbital hematoma. 4. No acute fracture in the cervical spine. 5. No acute traumatic injury in the chest. 6. Large heterogenous pelvic mass measuring 11.9 x 12.4 x 14.9 cm likely adnexal or possibly uterine in origin. 7. Moderate-to-large hemoperitoneum greatest about the upper abdomen. The source of the hemorrhage is presumably the pelvic mass. 8. No acute fracture in the lumbar spine. Aortic Atherosclerosis (ICD10-I70.0). Critical Value/emergent results were called by telephone at the time of interpretation on 12/09/2022 at 11:51 pm to provider Dr Andrey Campanile, who verbally acknowledged these results. Electronically Signed   By: Minerva Fester M.D.   On: 12/09/2022 00:26   CT L-SPINE NO CHARGE  Result Date: 12/09/2022 CLINICAL DATA:  Ground level fall on blood thinners EXAM: CT HEAD WITHOUT CONTRAST CT MAXILLOFACIAL WITHOUT  mass lesion/mass effect. Vascular: No hyperdense vessel or unexpected calcification. Skull: Normal. Negative for fracture or focal lesion. Other: None. CT MAXILLOFACIAL FINDINGS Osseous: Fracture of the left orbit with fractures of the medial and inferior walls. Herniation of fat into the left maxillary sinus through the inferior floor defect. Orbits: The globes are intact.  No postseptal hematoma.  Sinuses: About products in the left maxillary sinus. The sinuses are otherwise well aerated. No mastoid effusion. Soft tissues: Left forehead/periorbital hematoma. CT CERVICAL SPINE FINDINGS Alignment: No evidence of traumatic malalignment. Skull base and vertebrae: No acute fracture. Soft tissues and spinal canal: Thickening of the prevertebral soft tissues due to medialized carotid arteries posterior to the esophagus. No prevertebral swelling or hematoma. Disc levels: Age-related spondylosis greatest at C4-C5 and C5-C6 where it is moderate. Posterior disc osteophyte complex at C4-C5 and C5-C6 cause mild effacement the ventral thecal sac. No severe spinal canal narrowing. Other: Carotid calcification. CT CHEST FINDINGS Cardiovascular: No pericardial effusion. No evidence of aortic injury. Coronary artery and aortic atherosclerotic calcification. Mediastinum/Nodes: Trachea and esophagus are unremarkable. No mediastinal hematoma. Lungs/Pleura: Scattered scarring/atelectasis. No focal consolidation, pleural effusion, or pneumothorax. Musculoskeletal: No acute fracture.  Remote fracture of the sternum. CT ABDOMEN PELVIS FINDINGS Hepatobiliary: No hepatic laceration or hematoma. Hepatic cysts. Unremarkable gallbladder and biliary tree. Pancreas: Unremarkable. Spleen: No splenic laceration or hematoma. Adrenals/Urinary Tract: No adrenal hemorrhage. No renal laceration or hematoma. Low-attenuation lesions in the kidneys are statistically likely to represent cysts. No follow-up is required. Unremarkable bladder. Stomach/Bowel: Normal caliber large and small bowel. Mild wall thickening of a loop of small bowel in the central mesentery likely reactive secondary to adjacent blood products. Decompressed stomach. There is some loss definition of the lateral wall of the gastric fundus (circa series 6/image 96). This is favored to represent silhouetting due to adjacent hemorrhage rather than perforation as there is no free  intraperitoneal air. Colonic diverticulosis without diverticulitis. Vascular/Lymphatic: Aortic atherosclerosis. No enlarged abdominal or pelvic lymph nodes. Reproductive: Large heterogenous pelvic mass measures 11.9 x 12.4 x 14.9 cm. This exerts mass effect on the uterus on lateral view. No discrete ovaries are seen. Other: Moderate-to-large hemoperitoneum greatest about the upper abdomen. The source of the hemorrhage is presumably the pelvic mass. No free intraperitoneal air. Musculoskeletal: No acute fracture. CT LUMBAR SPINE Segmentation: 5 lumbar type vertebrae. Alignment: No evidence of traumatic malalignment. Vertebrae: No acute fracture. Paraspinal and other soft tissues: See above. Disc levels: Decompressive laminectomy at L3-L4 and L4-L5. Multilevel advanced spondylosis and facet arthropathy. IMPRESSION: 1. No acute intracranial abnormality. 2. Acute blow out fracture of the left orbit with fractures of the medial and inferior walls. Herniation of fat through the inferior wall. Correlate for entrapment. 3. Left forehead/periorbital hematoma. 4. No acute fracture in the cervical spine. 5. No acute traumatic injury in the chest. 6. Large heterogenous pelvic mass measuring 11.9 x 12.4 x 14.9 cm likely adnexal or possibly uterine in origin. 7. Moderate-to-large hemoperitoneum greatest about the upper abdomen. The source of the hemorrhage is presumably the pelvic mass. 8. No acute fracture in the lumbar spine. Aortic Atherosclerosis (ICD10-I70.0). Critical Value/emergent results were called by telephone at the time of interpretation on 12/09/2022 at 11:51 pm to provider Dr Andrey Campanile, who verbally acknowledged these results. Electronically Signed   By: Minerva Fester M.D.   On: 12/09/2022 00:26   CT L-SPINE NO CHARGE  Result Date: 12/09/2022 CLINICAL DATA:  Ground level fall on blood thinners EXAM: CT HEAD WITHOUT CONTRAST CT MAXILLOFACIAL WITHOUT  Mediastinum/Nodes: Trachea and esophagus are unremarkable. No mediastinal hematoma. Lungs/Pleura: Scattered scarring/atelectasis. No focal consolidation, pleural effusion, or pneumothorax. Musculoskeletal: No acute fracture.  Remote fracture of the sternum. CT ABDOMEN PELVIS FINDINGS Hepatobiliary: No hepatic laceration or hematoma. Hepatic cysts. Unremarkable gallbladder and biliary tree. Pancreas: Unremarkable. Spleen: No splenic laceration or hematoma. Adrenals/Urinary Tract: No adrenal hemorrhage. No renal laceration or hematoma. Low-attenuation lesions in the kidneys are statistically likely to represent cysts. No follow-up is required. Unremarkable bladder. Stomach/Bowel: Normal caliber large and small bowel. Mild wall thickening of a loop of  small bowel in the central mesentery likely reactive secondary to adjacent blood products. Decompressed stomach. There is some loss definition of the lateral wall of the gastric fundus (circa series 6/image 96). This is favored to represent silhouetting due to adjacent hemorrhage rather than perforation as there is no free intraperitoneal air. Colonic diverticulosis without diverticulitis. Vascular/Lymphatic: Aortic atherosclerosis. No enlarged abdominal or pelvic lymph nodes. Reproductive: Large heterogenous pelvic mass measures 11.9 x 12.4 x 14.9 cm. This exerts mass effect on the uterus on lateral view. No discrete ovaries are seen. Other: Moderate-to-large hemoperitoneum greatest about the upper abdomen. The source of the hemorrhage is presumably the pelvic mass. No free intraperitoneal air. Musculoskeletal: No acute fracture. CT LUMBAR SPINE Segmentation: 5 lumbar type vertebrae. Alignment: No evidence of traumatic malalignment. Vertebrae: No acute fracture. Paraspinal and other soft tissues: See above. Disc levels: Decompressive laminectomy at L3-L4 and L4-L5. Multilevel advanced spondylosis and facet arthropathy. IMPRESSION: 1. No acute intracranial abnormality. 2. Acute blow out fracture of the left orbit with fractures of the medial and inferior walls. Herniation of fat through the inferior wall. Correlate for entrapment. 3. Left forehead/periorbital hematoma. 4. No acute fracture in the cervical spine. 5. No acute traumatic injury in the chest. 6. Large heterogenous pelvic mass measuring 11.9 x 12.4 x 14.9 cm likely adnexal or possibly uterine in origin. 7. Moderate-to-large hemoperitoneum greatest about the upper abdomen. The source of the hemorrhage is presumably the pelvic mass. 8. No acute fracture in the lumbar spine. Aortic Atherosclerosis (ICD10-I70.0). Critical Value/emergent results were called by telephone at the time of interpretation on 12/26/2022 at 11:51 pm to provider Dr Andrey Campanile, who verbally  acknowledged these results. Electronically Signed   By: Minerva Fester M.D.   On: 12/09/2022 00:26   CT CHEST ABDOMEN PELVIS W CONTRAST  Result Date: 12/09/2022 CLINICAL DATA:  Ground level fall on blood thinners EXAM: CT HEAD WITHOUT CONTRAST CT MAXILLOFACIAL WITHOUT CONTRAST CT CERVICAL SPINE WITHOUT CONTRAST CT CHEST, ABDOMEN AND PELVIS WITH CONTRAST TECHNIQUE: Contiguous axial images were obtained from the base of the skull through the vertex without intravenous contrast. Multidetector CT imaging of the maxillofacial structures was performed. Multiplanar CT image reconstructions were also generated. A small metallic BB was placed on the right temple in order to reliably differentiate right from left. Multidetector CT imaging of the cervical spine was performed without intravenous contrast. Multiplanar CT image reconstructions were also generated. Multidetector CT imaging of the chest, abdomen and pelvis was performed following the standard protocol during bolus administration of intravenous contrast. RADIATION DOSE REDUCTION: This exam was performed according to the departmental dose-optimization program which includes automated exposure control, adjustment of the mA and/or kV according to patient size and/or use of iterative reconstruction technique. CONTRAST:  75mL OMNIPAQUE IOHEXOL 350 MG/ML SOLN COMPARISON:  Renal ultrasound 11/17/2022; CT head and cervical spine 11/18/2018; CT 12/22/2012 FINDINGS: CT HEAD FINDINGS Brain: No evidence of acute infarction, hemorrhage, hydrocephalus, extra-axial collection or  Mediastinum/Nodes: Trachea and esophagus are unremarkable. No mediastinal hematoma. Lungs/Pleura: Scattered scarring/atelectasis. No focal consolidation, pleural effusion, or pneumothorax. Musculoskeletal: No acute fracture.  Remote fracture of the sternum. CT ABDOMEN PELVIS FINDINGS Hepatobiliary: No hepatic laceration or hematoma. Hepatic cysts. Unremarkable gallbladder and biliary tree. Pancreas: Unremarkable. Spleen: No splenic laceration or hematoma. Adrenals/Urinary Tract: No adrenal hemorrhage. No renal laceration or hematoma. Low-attenuation lesions in the kidneys are statistically likely to represent cysts. No follow-up is required. Unremarkable bladder. Stomach/Bowel: Normal caliber large and small bowel. Mild wall thickening of a loop of  small bowel in the central mesentery likely reactive secondary to adjacent blood products. Decompressed stomach. There is some loss definition of the lateral wall of the gastric fundus (circa series 6/image 96). This is favored to represent silhouetting due to adjacent hemorrhage rather than perforation as there is no free intraperitoneal air. Colonic diverticulosis without diverticulitis. Vascular/Lymphatic: Aortic atherosclerosis. No enlarged abdominal or pelvic lymph nodes. Reproductive: Large heterogenous pelvic mass measures 11.9 x 12.4 x 14.9 cm. This exerts mass effect on the uterus on lateral view. No discrete ovaries are seen. Other: Moderate-to-large hemoperitoneum greatest about the upper abdomen. The source of the hemorrhage is presumably the pelvic mass. No free intraperitoneal air. Musculoskeletal: No acute fracture. CT LUMBAR SPINE Segmentation: 5 lumbar type vertebrae. Alignment: No evidence of traumatic malalignment. Vertebrae: No acute fracture. Paraspinal and other soft tissues: See above. Disc levels: Decompressive laminectomy at L3-L4 and L4-L5. Multilevel advanced spondylosis and facet arthropathy. IMPRESSION: 1. No acute intracranial abnormality. 2. Acute blow out fracture of the left orbit with fractures of the medial and inferior walls. Herniation of fat through the inferior wall. Correlate for entrapment. 3. Left forehead/periorbital hematoma. 4. No acute fracture in the cervical spine. 5. No acute traumatic injury in the chest. 6. Large heterogenous pelvic mass measuring 11.9 x 12.4 x 14.9 cm likely adnexal or possibly uterine in origin. 7. Moderate-to-large hemoperitoneum greatest about the upper abdomen. The source of the hemorrhage is presumably the pelvic mass. 8. No acute fracture in the lumbar spine. Aortic Atherosclerosis (ICD10-I70.0). Critical Value/emergent results were called by telephone at the time of interpretation on 12/26/2022 at 11:51 pm to provider Dr Andrey Campanile, who verbally  acknowledged these results. Electronically Signed   By: Minerva Fester M.D.   On: 12/09/2022 00:26   CT CHEST ABDOMEN PELVIS W CONTRAST  Result Date: 12/09/2022 CLINICAL DATA:  Ground level fall on blood thinners EXAM: CT HEAD WITHOUT CONTRAST CT MAXILLOFACIAL WITHOUT CONTRAST CT CERVICAL SPINE WITHOUT CONTRAST CT CHEST, ABDOMEN AND PELVIS WITH CONTRAST TECHNIQUE: Contiguous axial images were obtained from the base of the skull through the vertex without intravenous contrast. Multidetector CT imaging of the maxillofacial structures was performed. Multiplanar CT image reconstructions were also generated. A small metallic BB was placed on the right temple in order to reliably differentiate right from left. Multidetector CT imaging of the cervical spine was performed without intravenous contrast. Multiplanar CT image reconstructions were also generated. Multidetector CT imaging of the chest, abdomen and pelvis was performed following the standard protocol during bolus administration of intravenous contrast. RADIATION DOSE REDUCTION: This exam was performed according to the departmental dose-optimization program which includes automated exposure control, adjustment of the mA and/or kV according to patient size and/or use of iterative reconstruction technique. CONTRAST:  75mL OMNIPAQUE IOHEXOL 350 MG/ML SOLN COMPARISON:  Renal ultrasound 11/17/2022; CT head and cervical spine 11/18/2018; CT 12/22/2012 FINDINGS: CT HEAD FINDINGS Brain: No evidence of acute infarction, hemorrhage, hydrocephalus, extra-axial collection or  Mediastinum/Nodes: Trachea and esophagus are unremarkable. No mediastinal hematoma. Lungs/Pleura: Scattered scarring/atelectasis. No focal consolidation, pleural effusion, or pneumothorax. Musculoskeletal: No acute fracture.  Remote fracture of the sternum. CT ABDOMEN PELVIS FINDINGS Hepatobiliary: No hepatic laceration or hematoma. Hepatic cysts. Unremarkable gallbladder and biliary tree. Pancreas: Unremarkable. Spleen: No splenic laceration or hematoma. Adrenals/Urinary Tract: No adrenal hemorrhage. No renal laceration or hematoma. Low-attenuation lesions in the kidneys are statistically likely to represent cysts. No follow-up is required. Unremarkable bladder. Stomach/Bowel: Normal caliber large and small bowel. Mild wall thickening of a loop of  small bowel in the central mesentery likely reactive secondary to adjacent blood products. Decompressed stomach. There is some loss definition of the lateral wall of the gastric fundus (circa series 6/image 96). This is favored to represent silhouetting due to adjacent hemorrhage rather than perforation as there is no free intraperitoneal air. Colonic diverticulosis without diverticulitis. Vascular/Lymphatic: Aortic atherosclerosis. No enlarged abdominal or pelvic lymph nodes. Reproductive: Large heterogenous pelvic mass measures 11.9 x 12.4 x 14.9 cm. This exerts mass effect on the uterus on lateral view. No discrete ovaries are seen. Other: Moderate-to-large hemoperitoneum greatest about the upper abdomen. The source of the hemorrhage is presumably the pelvic mass. No free intraperitoneal air. Musculoskeletal: No acute fracture. CT LUMBAR SPINE Segmentation: 5 lumbar type vertebrae. Alignment: No evidence of traumatic malalignment. Vertebrae: No acute fracture. Paraspinal and other soft tissues: See above. Disc levels: Decompressive laminectomy at L3-L4 and L4-L5. Multilevel advanced spondylosis and facet arthropathy. IMPRESSION: 1. No acute intracranial abnormality. 2. Acute blow out fracture of the left orbit with fractures of the medial and inferior walls. Herniation of fat through the inferior wall. Correlate for entrapment. 3. Left forehead/periorbital hematoma. 4. No acute fracture in the cervical spine. 5. No acute traumatic injury in the chest. 6. Large heterogenous pelvic mass measuring 11.9 x 12.4 x 14.9 cm likely adnexal or possibly uterine in origin. 7. Moderate-to-large hemoperitoneum greatest about the upper abdomen. The source of the hemorrhage is presumably the pelvic mass. 8. No acute fracture in the lumbar spine. Aortic Atherosclerosis (ICD10-I70.0). Critical Value/emergent results were called by telephone at the time of interpretation on 12/26/2022 at 11:51 pm to provider Dr Andrey Campanile, who verbally  acknowledged these results. Electronically Signed   By: Minerva Fester M.D.   On: 12/09/2022 00:26   CT CHEST ABDOMEN PELVIS W CONTRAST  Result Date: 12/09/2022 CLINICAL DATA:  Ground level fall on blood thinners EXAM: CT HEAD WITHOUT CONTRAST CT MAXILLOFACIAL WITHOUT CONTRAST CT CERVICAL SPINE WITHOUT CONTRAST CT CHEST, ABDOMEN AND PELVIS WITH CONTRAST TECHNIQUE: Contiguous axial images were obtained from the base of the skull through the vertex without intravenous contrast. Multidetector CT imaging of the maxillofacial structures was performed. Multiplanar CT image reconstructions were also generated. A small metallic BB was placed on the right temple in order to reliably differentiate right from left. Multidetector CT imaging of the cervical spine was performed without intravenous contrast. Multiplanar CT image reconstructions were also generated. Multidetector CT imaging of the chest, abdomen and pelvis was performed following the standard protocol during bolus administration of intravenous contrast. RADIATION DOSE REDUCTION: This exam was performed according to the departmental dose-optimization program which includes automated exposure control, adjustment of the mA and/or kV according to patient size and/or use of iterative reconstruction technique. CONTRAST:  75mL OMNIPAQUE IOHEXOL 350 MG/ML SOLN COMPARISON:  Renal ultrasound 11/17/2022; CT head and cervical spine 11/18/2018; CT 12/22/2012 FINDINGS: CT HEAD FINDINGS Brain: No evidence of acute infarction, hemorrhage, hydrocephalus, extra-axial collection or

## 2022-12-09 NOTE — Consult Note (Signed)
Ophthalmology Consult   Tamara Larson is an 82 y.o. female with past medical history of CAD, chronic kidney disease, persistent A-fib on Eliquis, hypertension, hyper cholesterolemia reports that she fell and hit left eye.  CT shows orbital medial wall and floor fracture with fat herniation but no entrapment.  No open globe seen on CT.  Pt has h/o cataract surgery in both eyes in the past and recently had SLT laser in the left eye for glaucoma.  Pt is currently on latanoprost and is being treated by [facility].  Pt states vision has not changed since the fall and that the left eye has been her weaker eye.  On exam pt was 20/50 OD and 20/70 OS with near card and +2.50 lens.  PERRL, extraocular movement intact with full ductions and versions and no limitation or sign of entrapment.  Visual field full grossly.  IOP was 14 OD and 16 OS.    Pt has echymosis and mild swelling of the left upper eyelid with small abrasion of upper eyelid.  The eye can open fully without assistance.  Pt with subconjunctival hemorrhage temporally in the left eye with borders seen.  No conjunctival laceration or uveal tissue seen.  Cornea clear in both eyes without abrasions.  Iris reactive and round in both eyes and no inflammation or hyphema seen in either eye.  PCIOL in place in both eyes.  On dilated exam retina was flat and intact in both eyes with no hemorrhage or commotio seen.  C/D was 0.6 OD and 0.8 OS.  A/P Trauma Left eye:  no open globe seen.  No entrapment seen.  No diplopia and minimal pain on exam. No blowing nose and open sneezes secondary to blow out fracture. Pt to follow up with her ophthalmologist to evaluate once discharged from hospital.  Pt can see me if unable to see her ophthalmologist.   Glaucoma: being followed by [facility].  Pt had SLT last week and finishes her post laser steroid drop tomorrow as per patient.  CPM with latanoprost at bedtime in both eyes. Pseudophakic OU: stable   Thank you  for allowing me to participate in the care of this patient.  Please feel free to contact me if you have any concerns.  Mia Creek, M.D. (Cell) [phone] (Office) [phone]  Office location  [address] [place]
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
## 2022-12-09 NOTE — ED Notes (Signed)
Date and time results received: 12/09/22 0336 (use smartphrase ".now" to insert current time)  Test: Lactic Acid Critical Value: 7.8  Name of Provider Notified: Cyril Mourning MD  Orders Received? Or Actions Taken?:  MD to review

## 2022-12-09 NOTE — ED Notes (Signed)
Hgb 6.1 Trauma MD E. Wilson notified via secure message

## 2022-12-09 NOTE — ED Notes (Signed)
Attempted to call floor, no answer. Will attempt again.

## 2022-12-09 NOTE — Sedation Documentation (Signed)
Sheath removed and 6 fr angioseal depolyed at 1333

## 2022-12-09 NOTE — ED Provider Notes (Signed)
Patient received a signout from Dr. Particia Nearing at shift change.  Patient seen after a fall at home.  Physical Exam  BP 104/65 (BP Location: Right Arm)   Pulse (!) 118   Temp (!) 95.9 F (35.5 C) (Temporal)   Resp (!) 23   Wt 84.5 kg   SpO2 98%   BMI 29.18 kg/m   Physical Exam HENT:     Head: Contusion (left periorbital) present.  Eyes:     General: Lids are normal. Vision grossly intact. Gaze aligned appropriately.     Intraocular pressure: Left eye pressure is 18 mmHg.     Extraocular Movements:     Left eye: Normal extraocular motion.     Conjunctiva/sclera:     Left eye: Hemorrhage present.     Pupils:     Left eye: No corneal abrasion or fluorescein uptake. Seidel exam negative. Cardiovascular:     Rate and Rhythm: Tachycardia present. Rhythm regularly irregular.  Pulmonary:     Breath sounds: Normal breath sounds.  Abdominal:     Tenderness: There is generalized abdominal tenderness and tenderness in the right lower quadrant.  Neurological:     Mental Status: She is alert.     Procedures  .Critical Care  Performed by: Gilda Crease, MD Authorized by: Gilda Crease, MD   Critical care provider statement:    Critical care time (minutes):  30   Critical care was necessary to treat or prevent imminent or life-threatening deterioration of the following conditions:  Shock, trauma and circulatory failure   Critical care was time spent personally by me on the following activities:  Development of treatment plan with patient or surrogate, discussions with consultants, evaluation of patient's response to treatment, examination of patient, ordering and review of laboratory studies, ordering and review of radiographic studies, ordering and performing treatments and interventions, pulse oximetry, re-evaluation of patient's condition and review of old charts   I assumed direction of critical care for this patient from another provider in my specialty: yes     Care  discussed with: admitting provider     ED Course / MDM    Medical Decision Making Amount and/or Complexity of Data Reviewed Labs: ordered. Decision-making details documented in ED Course. Radiology: independent interpretation performed. Decision-making details documented in ED Course. ECG/medicine tests: independent interpretation performed. Decision-making details documented in ED Course.  Risk Prescription drug management.   Presents after a fall at home.  Patient noted to be tachycardic, hypotensive and hypothermic at arrival.  Sepsis considered, broad-spectrum antibiotics initiated.  Blood work returns hemoglobin of 6.7.  Patient with hemoglobin in the 11's 1 month ago.  Hemoccult negative.  CT abdomen and pelvis interpretation shows evidence of hemoperitoneum.  CT findings discussed with Dr. Andrey Campanile.  Patient has a pelvic mass which is the presumed source of bleeding.  Dr. Andrey Campanile does not feel that there is any bleeding coming from stomach or other upper abdominal organs.  Does not recommend surgical exploration at this time.  Discussed with pharmacy, will dose Kcentra with possible need for surgery pending.  CT facial bone reviewed.  Patient with left orbital blowout fracture.  Fracture contains some fat but does not obviously contain any ocular muscle.  Examination reveals that she does have preserved range of motion.  There is subconjunctival hemorrhage noted.  No evidence of open globe by Seidel, intraocular pressure is 18.  Fracture discussed with Dr. Jearld Fenton, confirms nothing to do currently, can follow-up in the ENT office in 1  week.  Case discussed with Dr. Vassie Loll, on-call for critical care.  Will admit patient.  Critical care medicine will consult gynecology in the morning.       Gilda Crease, MD 12/09/22 [phone]
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
## 2022-12-09 NOTE — ED Notes (Signed)
(  Bilateral eyes were dilated by ophthalmology)

## 2022-12-09 NOTE — ED Notes (Signed)
Patient position adjusted.

## 2022-12-09 NOTE — TOC CAGE-AID Note (Signed)
Transition of Care [facility]) - CAGE-AID Screening   Patient Details  Name: Tamara Larson MRN: [id number] Date of Birth: 03-20-1940  Transition of Care [facility]) CM/SW Contact:    Katha Hamming, RN Phone Number: 12/09/2022, 1:56 AM   CAGE-AID Screening:    Have You Ever Felt You Ought to Cut Down on Your Drinking or Drug Use?: No Have People Annoyed You By [employer] Your Drinking Or Drug Use?: No Have You Felt Bad Or Guilty About Your Drinking Or Drug Use?: No Have You Ever Had a Drink or Used Drugs First Thing In The Morning to Steady Your Nerves or to Get Rid of a Hangover?: No CAGE-AID Score: 0  Substance Abuse Education Offered: No

## 2022-12-10 ENCOUNTER — Inpatient Hospital Stay (HOSPITAL_COMMUNITY): Payer: Medicare HMO

## 2022-12-10 DIAGNOSIS — R578 Other shock: Secondary | ICD-10-CM | POA: Diagnosis not present

## 2022-12-10 LAB — TYPE AND SCREEN
ABO/RH(D): O POS
Antibody Screen: NEGATIVE
Unit division: 0
Unit division: 0
Unit division: 0

## 2022-12-10 LAB — COMPREHENSIVE METABOLIC PANEL
ALT: 38 U/L (ref 0–44)
AST: 54 U/L — ABNORMAL HIGH (ref 15–41)
Albumin: 2.8 g/dL — ABNORMAL LOW (ref 3.5–5.0)
Alkaline Phosphatase: 49 U/L (ref 38–126)
Anion gap: 17 — ABNORMAL HIGH (ref 5–15)
BUN: 39 mg/dL — ABNORMAL HIGH (ref 8–23)
CO2: 15 mmol/L — ABNORMAL LOW (ref 22–32)
Calcium: 8.1 mg/dL — ABNORMAL LOW (ref 8.9–10.3)
Chloride: 108 mmol/L (ref 98–111)
Creatinine, Ser: 3.88 mg/dL — ABNORMAL HIGH (ref 0.44–1.00)
GFR, Estimated: 11 mL/min — ABNORMAL LOW (ref >60)
Glucose, Bld: 130 mg/dL — ABNORMAL HIGH (ref 70–99)
Potassium: 4.5 mmol/L (ref 3.5–5.1)
Sodium: 140 mmol/L (ref 135–145)
Total Bilirubin: 1 mg/dL (ref 0.3–1.2)
Total Protein: 5 g/dL — ABNORMAL LOW (ref 6.5–8.1)

## 2022-12-10 LAB — LACTIC ACID, PLASMA
Lactic Acid, Venous: 4.1 mmol/L (ref 0.5–1.9)
Lactic Acid, Venous: 1.9 mmol/L (ref 0.5–1.9)
Lactic Acid, Venous: 1.8 mmol/L (ref 0.5–1.9)

## 2022-12-10 LAB — BPAM RBC
Blood Product Expiration Date: 202411052359
Blood Product Expiration Date: 202411072359
Blood Product Expiration Date: 202411072359
ISSUE DATE / TIME: 202410110034
ISSUE DATE / TIME: 202410110537
ISSUE DATE / TIME: 202410110537
Unit Type and Rh: 5100
Unit Type and Rh: 5100
Unit Type and Rh: 5100

## 2022-12-10 LAB — URINALYSIS, W/ REFLEX TO CULTURE (INFECTION SUSPECTED)
Bilirubin Urine: NEGATIVE
Glucose, UA: NEGATIVE mg/dL
Ketones, ur: 5 mg/dL — AB
Leukocytes,Ua: NEGATIVE
Nitrite: NEGATIVE
Protein, ur: 100 mg/dL — AB
Specific Gravity, Urine: 1.035 — ABNORMAL HIGH (ref 1.005–1.030)
pH: 5 (ref 5.0–8.0)

## 2022-12-10 LAB — CBC
HCT: 27.4 % — ABNORMAL LOW (ref 36.0–46.0)
Hemoglobin: 9.1 g/dL — ABNORMAL LOW (ref 12.0–15.0)
MCH: 29.6 pg (ref 26.0–34.0)
MCHC: 33.2 g/dL (ref 30.0–36.0)
MCV: 89.3 fL (ref 80.0–100.0)
Platelets: 116 10*3/uL — ABNORMAL LOW (ref 150–400)
RBC: 3.07 MIL/uL — ABNORMAL LOW (ref 3.87–5.11)
RDW: 16.8 % — ABNORMAL HIGH (ref 11.5–15.5)
WBC: 28.8 10*3/uL — ABNORMAL HIGH (ref 4.0–10.5)
nRBC: 0.3 % — ABNORMAL HIGH (ref 0.0–0.2)

## 2022-12-10 LAB — PROCALCITONIN: Procalcitonin: 2.48 ng/mL

## 2022-12-10 LAB — GLUCOSE, CAPILLARY: Glucose-Capillary: 120 mg/dL — ABNORMAL HIGH (ref 70–99)

## 2022-12-10 MED ORDER — OXYCODONE HCL 5 MG PO TABS
5.0000 mg | ORAL_TABLET | ORAL | Status: DC | PRN
  Administered 2022-12-11: 5 mg via ORAL
  Filled 2022-12-10: qty 1

## 2022-12-10 MED ORDER — HYDROMORPHONE HCL 1 MG/ML IJ SOLN: 0.5000 mg | INTRAMUSCULAR | Status: DC | PRN

## 2022-12-10 MED ORDER — SODIUM CHLORIDE 0.9 % IV BOLUS
500.0000 mL | Freq: Once | INTRAVENOUS | Status: AC
  Administered 2022-12-10: 500 mL via INTRAVENOUS

## 2022-12-10 MED ORDER — HYDROMORPHONE HCL 1 MG/ML IJ SOLN
1.0000 mg | INTRAMUSCULAR | Status: DC | PRN
  Administered 2022-12-10: 1 mg via INTRAVENOUS
  Filled 2022-12-10: qty 1

## 2022-12-10 MED ORDER — SODIUM CHLORIDE 0.9 % IV SOLN: 250.0000 mL | INTRAVENOUS | Status: DC

## 2022-12-10 MED ORDER — ALBUMIN HUMAN 25 % IV SOLN
25.0000 g | Freq: Four times a day (QID) | INTRAVENOUS | Status: AC
  Administered 2022-12-10 – 2022-12-11 (×4): 25 g via INTRAVENOUS
  Filled 2022-12-10 (×4): qty 100

## 2022-12-10 MED ORDER — NOREPINEPHRINE 4 MG/250ML-% IV SOLN
2.0000 ug/min | INTRAVENOUS | Status: DC
  Administered 2022-12-10: 2 ug/min via INTRAVENOUS
  Administered 2022-12-10 – 2022-12-11 (×2): 4 ug/min via INTRAVENOUS
  Administered 2022-12-12: 9 ug/min via INTRAVENOUS
  Filled 2022-12-10 (×4): qty 250

## 2022-12-10 MED ORDER — SODIUM CHLORIDE 0.9 % IV SOLN
2.0000 g | INTRAVENOUS | Status: DC
  Administered 2022-12-10 – 2022-12-11 (×2): 2 g via INTRAVENOUS
  Filled 2022-12-10 (×2): qty 20

## 2022-12-10 NOTE — Progress Notes (Signed)
NAME:  Tamara Larson, MRN:  [id number], DOB:  1940-11-30, LOS: 1 ADMISSION DATE:  12/25/2022, CONSULTATION DATE:  12/10/2022  REFERRING MD:  Blinda Leatherwood, EDP , CHIEF COMPLAINT: Fall, hemoperitoneum  History of Present Illness:  82 year old woman with A-fib on Eliquis, BIBEMS from home after fall.  She had laser surgery on her eye for glaucoma 1 week ago.  She called her sister complaining of generalized weakness and by the time they got there, they had to force the door and found her on the floor.    In the ED, she was noted to have swelling and bruising around her left eye and abrasions on her kneeAnd reported head injury, hypotensive to the 80s, level 1 trauma was called. Labs showed hemoglobin of 6.7, lactate more than 15 She was given fluids and empiric cefepime, vancomycin and Flagyl Pan CT showed acute blowout fracture of the left orbit with fractures of the medial and inferior walls with herniation of fat, left periorbital hematoma, large pelvic mass measuring 12 x 12 x 15 cm, moderate to large hemoperitoneum more in the upper abdomen than in the pelvis.  Pertinent  Medical History  Hypertension Paroxysmal atrial fibrillation Hypothyroidism CAD with stent  Significant Hospital Events: Including procedures, antibiotic start and stop dates in addition to other pertinent events   10/11 found after ground-level fall with hypotension in the setting moderate to large hemoperitoneum.  Trauma and IR consulted, patient underwent embolization to internal iliac artery 10/12 remains pressor dependent with borderline hypotension and worsening AKI  Interim History / Subjective:  Seen lying in bed with continued complaints of pain and nausea   Objective   Blood pressure 102/66, pulse (!) 131, temperature 98.3 F (36.8 C), temperature source Axillary, resp. rate (!) 21, weight 84.5 kg, SpO2 94%.    FiO2 (%):  [28 %] 28 %   Intake/Output Summary (Last 24 hours) at 12/10/2022 6578 Last data filed  at 12/10/2022 0400 Gross per 24 hour  Intake 1365.04 ml  Output 50 ml  Net 1315.04 ml   Filed Weights   12/09/2022 2303  Weight: 84.5 kg    Examination: General: Acute on chronically ill appearing elderly female on lying in bed, in NAD HEENT: /AT, MM pink/moist, PERRL,  Neuro: Slightly sleepy but easily arouses to verbal stimuli, nonfocal CV: s1s2 regular rate and rhythm, no murmur, rubs, or gallops,  PULM: Diminished breath sounds bilaterally, no increased work of breathing, currently requiring 5 L nasal cannula GI: soft, bowel sounds active in all 4 quadrants, non-tender, non-distended Extremities: warm/dry, no edema  Skin: no rashes or lesions  Resolved Hospital Problem list     Assessment & Plan:   Hemorrhagic shock with hemoperitoneum -Hemoperitoneum likely related to large pelvic mass-May be ovarian or uterine in etiology.  Seen by trauma and they feel that suspicion for gastric injury causing hemoperitoneum is low with a ground-level fall. -Underwent embolization of internal iliac artery per IR 10/11 P: Close monitoring of hemodynamics in the ICU setting Continue pressors for MAP goal greater than 65 Trend CBC  Maintain 2 large bore PIVs Continue IV Protonix drip  Hold any further anticoagulation or NSAIDs  Ongoing IV resuscitation given poor oral intake  Leukocytosis with concern for evolving septic shock -WBC further increased to 28.8 with increased oxygen demand today. She also remains pressor dependant despite adequate resuscitation (hgb 9.1)  P: Close monitoring in the ICU setting Check CXR  Need to collect UA  Check procal  Start empiric ceftriaxone  Trend CBC and fever curve   Acute hypoxic respiratory failure  -Progressive hypoxia despite increasing supplemental oxygen requirement.  No gross aspiration events seen P: Stat chest x-ray Continue supplemental oxygen for sat goal > 92 Aspiration precautions Start empiric ceftriaxone as above Unable to  utilize BiPAP given left orbital wall fracture Consider obtaining ABG  Paroxysmal atrial fibrillation anticoagulated at baseline with Eliquis with intermittent episodes of A-fib RVR during shock state -Received Kcentra on admission P: Continue to hold home Eliquis Continuous telemetry Continue amiodarone drip  AKI on CKD stage IV  -Baseline creatinine appears to range between 1.8-2.1 with GFR mid to upper 20's -Patient states she would not consider hemodialysis  P: Follow renal function  Monitor urine output Trend Bmet Avoid nephrotoxins Ensure adequate renal perfusion  IV hydration  Left orbital wall fractures  -ophthalmology and maxillofacial surgery consulted P: No blowing nose Open sneezing  Outpatient follow up   Hypertension -Home medications include; metoprolol and ARB and amlodipine P: Hold home medications  Continuous telemetry   Best Practice (right click and "Reselect all SmartList Selections" daily)   Diet/type: NPO DVT prophylaxis: SCD GI prophylaxis: N/A Lines: N/A Foley:  N/A Code Status:  full code Last date of multidisciplinary goals of care discussion: Patient and family updated at bedside   Critical care time:   CRITICAL CARE Performed by: Lillyanne Bradburn D. Harris  Total critical care time: 40 minutes  Critical care time was exclusive of separately billable procedures and treating other patients.  Critical care was necessary to treat or prevent imminent or life-threatening deterioration.  Critical care was time spent personally by me on the following activities: development of treatment plan with patient and/or surrogate as well as nursing, discussions with consultants, evaluation of patient's response to treatment, examination of patient, obtaining history from patient or surrogate, ordering and performing treatments and interventions, ordering and review of laboratory studies, ordering and review of radiographic studies, pulse oximetry and  re-evaluation of patient's condition.  Lanetra Hartley D. Harris, NP-C [facility] Personal contact information can be found on Amion  If no contact or response made please call [phone] 12/10/2022, 9:51 AM

## 2022-12-10 NOTE — Progress Notes (Signed)
Patients breathing becoming more labored. Pulse ox having trouble reading 91% when able to get a reading, increased oxygen to 12L. Patient arousable but very drowsy, states she does not feel like she is having trouble breathing, lungs clear and diminished on auscultation. Blood sugar 120. Elink notified.

## 2022-12-10 NOTE — Plan of Care (Signed)
  Problem: Education: Goal: Knowledge of General Education information will improve Description Including pain rating scale, medication(s)/side effects and non-pharmacologic comfort measures Outcome: Progressing   

## 2022-12-10 NOTE — Progress Notes (Signed)
Levophed started on this patient via peripheral IV. IV team consulted for US guided IV for Levophed administration. IV team told this RN that the US guided IV consult in the Levophed order set is no longer in use/required. IV team stated that the risk of US guided IV is why it is no longer required. IV team told this RN to consult IV team back if patient requires more IV access.

## 2022-12-10 NOTE — Progress Notes (Signed)
There is some loss definition of the lateral wall of the gastric fundus (circa series 6/image 96). This is favored to represent silhouetting due to adjacent hemorrhage rather than perforation as there is no free intraperitoneal air. Colonic diverticulosis without diverticulitis. Vascular/Lymphatic: Aortic atherosclerosis. No enlarged abdominal or pelvic lymph nodes. Reproductive: Large heterogenous pelvic mass measures 11.9 x 12.4 x 14.9 cm. This exerts mass effect on the uterus on lateral view. No discrete ovaries are seen. Other: Moderate-to-large hemoperitoneum greatest about the upper abdomen.  The source of the hemorrhage is presumably the pelvic mass. No free intraperitoneal air. Musculoskeletal: No acute fracture. CT LUMBAR SPINE Segmentation: 5 lumbar type vertebrae. Alignment: No evidence of traumatic malalignment. Vertebrae: No acute fracture. Paraspinal and other soft tissues: See above. Disc levels: Decompressive laminectomy at L3-L4 and L4-L5. Multilevel advanced spondylosis and facet arthropathy. IMPRESSION: 1. No acute intracranial abnormality. 2. Acute blow out fracture of the left orbit with fractures of the medial and inferior walls. Herniation of fat through the inferior wall. Correlate for entrapment. 3. Left forehead/periorbital hematoma. 4. No acute fracture in the cervical spine. 5. No acute traumatic injury in the chest. 6. Large heterogenous pelvic mass measuring 11.9 x 12.4 x 14.9 cm likely adnexal or possibly uterine in origin. 7. Moderate-to-large hemoperitoneum greatest about the upper abdomen. The source of the hemorrhage is presumably the pelvic mass. 8. No acute fracture in the lumbar spine. Aortic Atherosclerosis (ICD10-I70.0). Critical Value/emergent results were called by telephone at the time of interpretation on 12/17/2022 at 11:51 pm to provider Dr Andrey Campanile, who verbally acknowledged these results. Electronically Signed   By: Minerva Fester M.D.   On: 12/09/2022 00:26   DG Pelvis Portable  Result Date: 12/28/2022 CLINICAL DATA:  Trauma, fall.  Pain. EXAM: PORTABLE PELVIS 1-2 VIEWS COMPARISON:  None Available. FINDINGS: The bones are under mineralized. The cortical margins of the bony pelvis are intact. No fracture. Pubic symphysis and sacroiliac joints are congruent. Both femoral heads are well-seated in the respective acetabula. Mild bilateral hip osteoarthritis. IMPRESSION: No pelvic fracture. Electronically Signed   By: Narda Rutherford M.D.   On: 12/25/2022 23:59   DG Chest Port 1 View  Result Date: 12/17/2022 CLINICAL DATA:  Trauma, fall. EXAM: PORTABLE CHEST  1 VIEW COMPARISON:  Subsequent chest CT available at time of radiograph interpretation. FINDINGS: The cardiomediastinal contours are normal. Subsegmental atelectasis at the right lung base with elevated hemidiaphragm. Pulmonary vasculature is normal. No consolidation, pleural effusion, or pneumothorax. No acute osseous abnormalities are seen. IMPRESSION: Subsegmental atelectasis at the right lung base with elevated hemidiaphragm. Electronically Signed   By: Narda Rutherford M.D.   On: 12/11/2022 23:58    Anti-infectives: Anti-infectives (From admission, onward)    Start     Dose/Rate Route Frequency Ordered Stop   12/09/22 1259  ceFAZolin (ANCEF) IVPB 2g/100 mL premix        over 30 Minutes Intravenous Continuous PRN 12/09/22 1301 12/09/22 1259   12/20/2022 2330  vancomycin (VANCOREADY) IVPB 2000 mg/400 mL        2,000 mg 200 mL/hr over 120 Minutes Intravenous  Once 12/15/2022 2316 12/09/22 0407   12/06/2022 2330  ceFEPIme (MAXIPIME) 2 g in sodium chloride 0.9 % 100 mL IVPB        2 g 200 mL/hr over 30 Minutes Intravenous  Once 12/24/2022 2316 12/09/22 0040   12/26/2022 2315  metroNIDAZOLE (FLAGYL) IVPB 500 mg        500 mg 100 mL/hr over 60 Minutes Intravenous  Subjective/Chief Complaint: Denies abdominal pain this morning Has taken in minimal po Had reported BM   Objective: Vital signs in last 24 hours: Temp:  [97.4 F (36.3 C)-98.7 F (37.1 C)] 98.3 F (36.8 C) (10/12 0704) Pulse Rate:  [101-151] 131 (10/12 0400) Resp:  [10-44] 21 (10/12 0400) BP: (76-137)/(30-118) 102/66 (10/12 0400) SpO2:  [73 %-100 %] 94 % (10/12 0400) FiO2 (%):  [28 %] 28 % (10/12 0400) Last BM Date : 12/09/22  Intake/Output from previous day: 10/11 0701 - 10/12 0700 In: 1472.9 [I.V.:901.9; Blood:376; IV Piggyback:195] Out: 50 [Urine:50] Intake/Output this shift: No intake/output data recorded.  Exam: Awake, follows commands Looks comfortable No resp distress Abdomen full but soft, non-tender, no frank peritonitis  Lab Results:  Recent Labs    12/09/22 0252 12/09/22 1202 12/09/22 2249 12/10/22 0305  WBC 20.2*  --   --  28.8*  HGB 6.1*   < > 9.0* 9.1*  HCT 20.5*   < > 27.1* 27.4*  PLT 106*  --   --  116*   < > = values in this interval not displayed.   BMET Recent Labs    12/09/22 0252 12/10/22 0305  NA 135 140  K 4.1 4.5  CL 104 108  CO2 11* 15*  GLUCOSE 76 130*  BUN 28* 39*  CREATININE 2.26* 3.88*  CALCIUM 7.0* 8.1*   PT/INR Recent Labs    12/11/2022 2253 12/09/22 0252  LABPROT 20.1* 19.4*  INR 1.7* 1.6*   ABG No results for input(s): "PHART", "HCO3" in the last 72 hours.  Invalid input(s): "PCO2", "PO2"  Studies/Results: IR EMBO ART  VEN HEMORR LYMPH EXTRAV  INC GUIDE ROADMAPPING  Result Date: 12/09/2022 INDICATION: large pelvic mass with hemoperitoneum.  Trauma with reported fall. EXAM: Procedures: 1. PELVIC ARTERIOGRAPHY 2. BILATERAL INTERNAL ILIAC ARTERY EMBOLIZATION MEDICATIONS: Ancef 2 gm IV. The antibiotic was administered within one hour of the procedure ANESTHESIA/SEDATION: Moderate (conscious) sedation was employed during this procedure. A total of Versed 1.5 mg and Fentanyl 75 mcg was administered  intravenously. Moderate Sedation Time: 39 minutes. The patient's level of consciousness and vital signs were monitored continuously by radiology nursing throughout the procedure under my direct supervision. CONTRAST:  75mL OMNIPAQUE IOHEXOL 300 MG/ML  SOLN FLUOROSCOPY TIME:  Fluoroscopic dose; 579 mGy COMPLICATIONS: None immediate. PROCEDURE: Written informed consent was obtained after a discussion with the patient and/or patient's representative about the risks including, but not limited to, infection, bleeding, injury to the arteries and/or organs, and need for an additional procedure. A femoral approach was selected. The RIGHT femoral head was marked fluoroscopically, then the groin were prepped and draped using all elements of maximal sterile barrier technique. 1% lidocaine was used for local anesthesia. Limited ultrasound imaging of the RIGHT groin showed the femoral artery to be patent. An ultrasound image was saved and sent to PACS. Arterial access was obtained with a 21-G, 7-cm needle under direct ultrasound guidance, through which a 0.018-inch guidewire was advanced under fluoroscopy. The 21-G needle was then exchanged for a micropuncture catheter and a 0.035-inch Glidewire. The micropuncture catheter was exchanged for a 5 Fr sheath. A 5 Fr cobra 2 was used to catheterize the LEFT and RIGHT internal iliac arteries through which digital subtraction angiograms (DSA) were obtained. The RIGHT internal iliac artery was cannulated following Waltman loop technique. PURPOSE OF THE ARTERIOGRAM: No previous catheter-directed angiogram was available. Therefore a new complete diagnostic angiography was performed. The decision to proceed with an interventional procedure was made  There is some loss definition of the lateral wall of the gastric fundus (circa series 6/image 96). This is favored to represent silhouetting due to adjacent hemorrhage rather than perforation as there is no free intraperitoneal air. Colonic diverticulosis without diverticulitis. Vascular/Lymphatic: Aortic atherosclerosis. No enlarged abdominal or pelvic lymph nodes. Reproductive: Large heterogenous pelvic mass measures 11.9 x 12.4 x 14.9 cm. This exerts mass effect on the uterus on lateral view. No discrete ovaries are seen. Other: Moderate-to-large hemoperitoneum greatest about the upper abdomen.  The source of the hemorrhage is presumably the pelvic mass. No free intraperitoneal air. Musculoskeletal: No acute fracture. CT LUMBAR SPINE Segmentation: 5 lumbar type vertebrae. Alignment: No evidence of traumatic malalignment. Vertebrae: No acute fracture. Paraspinal and other soft tissues: See above. Disc levels: Decompressive laminectomy at L3-L4 and L4-L5. Multilevel advanced spondylosis and facet arthropathy. IMPRESSION: 1. No acute intracranial abnormality. 2. Acute blow out fracture of the left orbit with fractures of the medial and inferior walls. Herniation of fat through the inferior wall. Correlate for entrapment. 3. Left forehead/periorbital hematoma. 4. No acute fracture in the cervical spine. 5. No acute traumatic injury in the chest. 6. Large heterogenous pelvic mass measuring 11.9 x 12.4 x 14.9 cm likely adnexal or possibly uterine in origin. 7. Moderate-to-large hemoperitoneum greatest about the upper abdomen. The source of the hemorrhage is presumably the pelvic mass. 8. No acute fracture in the lumbar spine. Aortic Atherosclerosis (ICD10-I70.0). Critical Value/emergent results were called by telephone at the time of interpretation on 12/17/2022 at 11:51 pm to provider Dr Andrey Campanile, who verbally acknowledged these results. Electronically Signed   By: Minerva Fester M.D.   On: 12/09/2022 00:26   DG Pelvis Portable  Result Date: 12/28/2022 CLINICAL DATA:  Trauma, fall.  Pain. EXAM: PORTABLE PELVIS 1-2 VIEWS COMPARISON:  None Available. FINDINGS: The bones are under mineralized. The cortical margins of the bony pelvis are intact. No fracture. Pubic symphysis and sacroiliac joints are congruent. Both femoral heads are well-seated in the respective acetabula. Mild bilateral hip osteoarthritis. IMPRESSION: No pelvic fracture. Electronically Signed   By: Narda Rutherford M.D.   On: 12/25/2022 23:59   DG Chest Port 1 View  Result Date: 12/17/2022 CLINICAL DATA:  Trauma, fall. EXAM: PORTABLE CHEST  1 VIEW COMPARISON:  Subsequent chest CT available at time of radiograph interpretation. FINDINGS: The cardiomediastinal contours are normal. Subsegmental atelectasis at the right lung base with elevated hemidiaphragm. Pulmonary vasculature is normal. No consolidation, pleural effusion, or pneumothorax. No acute osseous abnormalities are seen. IMPRESSION: Subsegmental atelectasis at the right lung base with elevated hemidiaphragm. Electronically Signed   By: Narda Rutherford M.D.   On: 12/11/2022 23:58    Anti-infectives: Anti-infectives (From admission, onward)    Start     Dose/Rate Route Frequency Ordered Stop   12/09/22 1259  ceFAZolin (ANCEF) IVPB 2g/100 mL premix        over 30 Minutes Intravenous Continuous PRN 12/09/22 1301 12/09/22 1259   12/20/2022 2330  vancomycin (VANCOREADY) IVPB 2000 mg/400 mL        2,000 mg 200 mL/hr over 120 Minutes Intravenous  Once 12/15/2022 2316 12/09/22 0407   12/06/2022 2330  ceFEPIme (MAXIPIME) 2 g in sodium chloride 0.9 % 100 mL IVPB        2 g 200 mL/hr over 30 Minutes Intravenous  Once 12/24/2022 2316 12/09/22 0040   12/26/2022 2315  metroNIDAZOLE (FLAGYL) IVPB 500 mg        500 mg 100 mL/hr over 60 Minutes Intravenous  SPINE Segmentation: 5 lumbar type vertebrae. Alignment: No evidence of traumatic malalignment. Vertebrae: No acute fracture. Paraspinal and other soft tissues: See above. Disc levels: Decompressive laminectomy at L3-L4 and L4-L5. Multilevel advanced spondylosis and facet arthropathy. IMPRESSION: 1. No acute intracranial abnormality. 2. Acute blow out fracture of the left orbit with fractures of the medial and inferior walls. Herniation of fat through the inferior wall. Correlate for entrapment. 3. Left  forehead/periorbital hematoma. 4. No acute fracture in the cervical spine. 5. No acute traumatic injury in the chest. 6. Large heterogenous pelvic mass measuring 11.9 x 12.4 x 14.9 cm likely adnexal or possibly uterine in origin. 7. Moderate-to-large hemoperitoneum greatest about the upper abdomen. The source of the hemorrhage is presumably the pelvic mass. 8. No acute fracture in the lumbar spine. Aortic Atherosclerosis (ICD10-I70.0). Critical Value/emergent results were called by telephone at the time of interpretation on 12/17/2022 at 11:51 pm to provider Dr Andrey Campanile, who verbally acknowledged these results. Electronically Signed   By: Minerva Fester M.D.   On: 12/09/2022 00:26   CT MAXILLOFACIAL WO CONTRAST  Result Date: 12/09/2022 CLINICAL DATA:  Ground level fall on blood thinners EXAM: CT HEAD WITHOUT CONTRAST CT MAXILLOFACIAL WITHOUT CONTRAST CT CERVICAL SPINE WITHOUT CONTRAST CT CHEST, ABDOMEN AND PELVIS WITH CONTRAST TECHNIQUE: Contiguous axial images were obtained from the base of the skull through the vertex without intravenous contrast. Multidetector CT imaging of the maxillofacial structures was performed. Multiplanar CT image reconstructions were also generated. A small metallic BB was placed on the right temple in order to reliably differentiate right from left. Multidetector CT imaging of the cervical spine was performed without intravenous contrast. Multiplanar CT image reconstructions were also generated. Multidetector CT imaging of the chest, abdomen and pelvis was performed following the standard protocol during bolus administration of intravenous contrast. RADIATION DOSE REDUCTION: This exam was performed according to the departmental dose-optimization program which includes automated exposure control, adjustment of the mA and/or kV according to patient size and/or use of iterative reconstruction technique. CONTRAST:  75mL OMNIPAQUE IOHEXOL 350 MG/ML SOLN COMPARISON:  Renal ultrasound  11/17/2022; CT head and cervical spine 11/18/2018; CT 12/22/2012 FINDINGS: CT HEAD FINDINGS Brain: No evidence of acute infarction, hemorrhage, hydrocephalus, extra-axial collection or mass lesion/mass effect. Vascular: No hyperdense vessel or unexpected calcification. Skull: Normal. Negative for fracture or focal lesion. Other: None. CT MAXILLOFACIAL FINDINGS Osseous: Fracture of the left orbit with fractures of the medial and inferior walls. Herniation of fat into the left maxillary sinus through the inferior floor defect. Orbits: The globes are intact.  No postseptal hematoma. Sinuses: About products in the left maxillary sinus. The sinuses are otherwise well aerated. No mastoid effusion. Soft tissues: Left forehead/periorbital hematoma. CT CERVICAL SPINE FINDINGS Alignment: No evidence of traumatic malalignment. Skull base and vertebrae: No acute fracture. Soft tissues and spinal canal: Thickening of the prevertebral soft tissues due to medialized carotid arteries posterior to the esophagus. No prevertebral swelling or hematoma. Disc levels: Age-related spondylosis greatest at C4-C5 and C5-C6 where it is moderate. Posterior disc osteophyte complex at C4-C5 and C5-C6 cause mild effacement the ventral thecal sac. No severe spinal canal narrowing. Other: Carotid calcification. CT CHEST FINDINGS Cardiovascular: No pericardial effusion. No evidence of aortic injury. Coronary artery and aortic atherosclerotic calcification. Mediastinum/Nodes: Trachea and esophagus are unremarkable. No mediastinal hematoma. Lungs/Pleura: Scattered scarring/atelectasis. No focal consolidation, pleural effusion, or pneumothorax. Musculoskeletal: No acute fracture.  Remote fracture of the sternum. CT ABDOMEN PELVIS FINDINGS Hepatobiliary: No hepatic laceration or hematoma. Hepatic cysts.  Subjective/Chief Complaint: Denies abdominal pain this morning Has taken in minimal po Had reported BM   Objective: Vital signs in last 24 hours: Temp:  [97.4 F (36.3 C)-98.7 F (37.1 C)] 98.3 F (36.8 C) (10/12 0704) Pulse Rate:  [101-151] 131 (10/12 0400) Resp:  [10-44] 21 (10/12 0400) BP: (76-137)/(30-118) 102/66 (10/12 0400) SpO2:  [73 %-100 %] 94 % (10/12 0400) FiO2 (%):  [28 %] 28 % (10/12 0400) Last BM Date : 12/09/22  Intake/Output from previous day: 10/11 0701 - 10/12 0700 In: 1472.9 [I.V.:901.9; Blood:376; IV Piggyback:195] Out: 50 [Urine:50] Intake/Output this shift: No intake/output data recorded.  Exam: Awake, follows commands Looks comfortable No resp distress Abdomen full but soft, non-tender, no frank peritonitis  Lab Results:  Recent Labs    12/09/22 0252 12/09/22 1202 12/09/22 2249 12/10/22 0305  WBC 20.2*  --   --  28.8*  HGB 6.1*   < > 9.0* 9.1*  HCT 20.5*   < > 27.1* 27.4*  PLT 106*  --   --  116*   < > = values in this interval not displayed.   BMET Recent Labs    12/09/22 0252 12/10/22 0305  NA 135 140  K 4.1 4.5  CL 104 108  CO2 11* 15*  GLUCOSE 76 130*  BUN 28* 39*  CREATININE 2.26* 3.88*  CALCIUM 7.0* 8.1*   PT/INR Recent Labs    12/11/2022 2253 12/09/22 0252  LABPROT 20.1* 19.4*  INR 1.7* 1.6*   ABG No results for input(s): "PHART", "HCO3" in the last 72 hours.  Invalid input(s): "PCO2", "PO2"  Studies/Results: IR EMBO ART  VEN HEMORR LYMPH EXTRAV  INC GUIDE ROADMAPPING  Result Date: 12/09/2022 INDICATION: large pelvic mass with hemoperitoneum.  Trauma with reported fall. EXAM: Procedures: 1. PELVIC ARTERIOGRAPHY 2. BILATERAL INTERNAL ILIAC ARTERY EMBOLIZATION MEDICATIONS: Ancef 2 gm IV. The antibiotic was administered within one hour of the procedure ANESTHESIA/SEDATION: Moderate (conscious) sedation was employed during this procedure. A total of Versed 1.5 mg and Fentanyl 75 mcg was administered  intravenously. Moderate Sedation Time: 39 minutes. The patient's level of consciousness and vital signs were monitored continuously by radiology nursing throughout the procedure under my direct supervision. CONTRAST:  75mL OMNIPAQUE IOHEXOL 300 MG/ML  SOLN FLUOROSCOPY TIME:  Fluoroscopic dose; 579 mGy COMPLICATIONS: None immediate. PROCEDURE: Written informed consent was obtained after a discussion with the patient and/or patient's representative about the risks including, but not limited to, infection, bleeding, injury to the arteries and/or organs, and need for an additional procedure. A femoral approach was selected. The RIGHT femoral head was marked fluoroscopically, then the groin were prepped and draped using all elements of maximal sterile barrier technique. 1% lidocaine was used for local anesthesia. Limited ultrasound imaging of the RIGHT groin showed the femoral artery to be patent. An ultrasound image was saved and sent to PACS. Arterial access was obtained with a 21-G, 7-cm needle under direct ultrasound guidance, through which a 0.018-inch guidewire was advanced under fluoroscopy. The 21-G needle was then exchanged for a micropuncture catheter and a 0.035-inch Glidewire. The micropuncture catheter was exchanged for a 5 Fr sheath. A 5 Fr cobra 2 was used to catheterize the LEFT and RIGHT internal iliac arteries through which digital subtraction angiograms (DSA) were obtained. The RIGHT internal iliac artery was cannulated following Waltman loop technique. PURPOSE OF THE ARTERIOGRAM: No previous catheter-directed angiogram was available. Therefore a new complete diagnostic angiography was performed. The decision to proceed with an interventional procedure was made  SPINE Segmentation: 5 lumbar type vertebrae. Alignment: No evidence of traumatic malalignment. Vertebrae: No acute fracture. Paraspinal and other soft tissues: See above. Disc levels: Decompressive laminectomy at L3-L4 and L4-L5. Multilevel advanced spondylosis and facet arthropathy. IMPRESSION: 1. No acute intracranial abnormality. 2. Acute blow out fracture of the left orbit with fractures of the medial and inferior walls. Herniation of fat through the inferior wall. Correlate for entrapment. 3. Left  forehead/periorbital hematoma. 4. No acute fracture in the cervical spine. 5. No acute traumatic injury in the chest. 6. Large heterogenous pelvic mass measuring 11.9 x 12.4 x 14.9 cm likely adnexal or possibly uterine in origin. 7. Moderate-to-large hemoperitoneum greatest about the upper abdomen. The source of the hemorrhage is presumably the pelvic mass. 8. No acute fracture in the lumbar spine. Aortic Atherosclerosis (ICD10-I70.0). Critical Value/emergent results were called by telephone at the time of interpretation on 12/17/2022 at 11:51 pm to provider Dr Andrey Campanile, who verbally acknowledged these results. Electronically Signed   By: Minerva Fester M.D.   On: 12/09/2022 00:26   CT MAXILLOFACIAL WO CONTRAST  Result Date: 12/09/2022 CLINICAL DATA:  Ground level fall on blood thinners EXAM: CT HEAD WITHOUT CONTRAST CT MAXILLOFACIAL WITHOUT CONTRAST CT CERVICAL SPINE WITHOUT CONTRAST CT CHEST, ABDOMEN AND PELVIS WITH CONTRAST TECHNIQUE: Contiguous axial images were obtained from the base of the skull through the vertex without intravenous contrast. Multidetector CT imaging of the maxillofacial structures was performed. Multiplanar CT image reconstructions were also generated. A small metallic BB was placed on the right temple in order to reliably differentiate right from left. Multidetector CT imaging of the cervical spine was performed without intravenous contrast. Multiplanar CT image reconstructions were also generated. Multidetector CT imaging of the chest, abdomen and pelvis was performed following the standard protocol during bolus administration of intravenous contrast. RADIATION DOSE REDUCTION: This exam was performed according to the departmental dose-optimization program which includes automated exposure control, adjustment of the mA and/or kV according to patient size and/or use of iterative reconstruction technique. CONTRAST:  75mL OMNIPAQUE IOHEXOL 350 MG/ML SOLN COMPARISON:  Renal ultrasound  11/17/2022; CT head and cervical spine 11/18/2018; CT 12/22/2012 FINDINGS: CT HEAD FINDINGS Brain: No evidence of acute infarction, hemorrhage, hydrocephalus, extra-axial collection or mass lesion/mass effect. Vascular: No hyperdense vessel or unexpected calcification. Skull: Normal. Negative for fracture or focal lesion. Other: None. CT MAXILLOFACIAL FINDINGS Osseous: Fracture of the left orbit with fractures of the medial and inferior walls. Herniation of fat into the left maxillary sinus through the inferior floor defect. Orbits: The globes are intact.  No postseptal hematoma. Sinuses: About products in the left maxillary sinus. The sinuses are otherwise well aerated. No mastoid effusion. Soft tissues: Left forehead/periorbital hematoma. CT CERVICAL SPINE FINDINGS Alignment: No evidence of traumatic malalignment. Skull base and vertebrae: No acute fracture. Soft tissues and spinal canal: Thickening of the prevertebral soft tissues due to medialized carotid arteries posterior to the esophagus. No prevertebral swelling or hematoma. Disc levels: Age-related spondylosis greatest at C4-C5 and C5-C6 where it is moderate. Posterior disc osteophyte complex at C4-C5 and C5-C6 cause mild effacement the ventral thecal sac. No severe spinal canal narrowing. Other: Carotid calcification. CT CHEST FINDINGS Cardiovascular: No pericardial effusion. No evidence of aortic injury. Coronary artery and aortic atherosclerotic calcification. Mediastinum/Nodes: Trachea and esophagus are unremarkable. No mediastinal hematoma. Lungs/Pleura: Scattered scarring/atelectasis. No focal consolidation, pleural effusion, or pneumothorax. Musculoskeletal: No acute fracture.  Remote fracture of the sternum. CT ABDOMEN PELVIS FINDINGS Hepatobiliary: No hepatic laceration or hematoma. Hepatic cysts.  Subjective/Chief Complaint: Denies abdominal pain this morning Has taken in minimal po Had reported BM   Objective: Vital signs in last 24 hours: Temp:  [97.4 F (36.3 C)-98.7 F (37.1 C)] 98.3 F (36.8 C) (10/12 0704) Pulse Rate:  [101-151] 131 (10/12 0400) Resp:  [10-44] 21 (10/12 0400) BP: (76-137)/(30-118) 102/66 (10/12 0400) SpO2:  [73 %-100 %] 94 % (10/12 0400) FiO2 (%):  [28 %] 28 % (10/12 0400) Last BM Date : 12/09/22  Intake/Output from previous day: 10/11 0701 - 10/12 0700 In: 1472.9 [I.V.:901.9; Blood:376; IV Piggyback:195] Out: 50 [Urine:50] Intake/Output this shift: No intake/output data recorded.  Exam: Awake, follows commands Looks comfortable No resp distress Abdomen full but soft, non-tender, no frank peritonitis  Lab Results:  Recent Labs    12/09/22 0252 12/09/22 1202 12/09/22 2249 12/10/22 0305  WBC 20.2*  --   --  28.8*  HGB 6.1*   < > 9.0* 9.1*  HCT 20.5*   < > 27.1* 27.4*  PLT 106*  --   --  116*   < > = values in this interval not displayed.   BMET Recent Labs    12/09/22 0252 12/10/22 0305  NA 135 140  K 4.1 4.5  CL 104 108  CO2 11* 15*  GLUCOSE 76 130*  BUN 28* 39*  CREATININE 2.26* 3.88*  CALCIUM 7.0* 8.1*   PT/INR Recent Labs    12/11/2022 2253 12/09/22 0252  LABPROT 20.1* 19.4*  INR 1.7* 1.6*   ABG No results for input(s): "PHART", "HCO3" in the last 72 hours.  Invalid input(s): "PCO2", "PO2"  Studies/Results: IR EMBO ART  VEN HEMORR LYMPH EXTRAV  INC GUIDE ROADMAPPING  Result Date: 12/09/2022 INDICATION: large pelvic mass with hemoperitoneum.  Trauma with reported fall. EXAM: Procedures: 1. PELVIC ARTERIOGRAPHY 2. BILATERAL INTERNAL ILIAC ARTERY EMBOLIZATION MEDICATIONS: Ancef 2 gm IV. The antibiotic was administered within one hour of the procedure ANESTHESIA/SEDATION: Moderate (conscious) sedation was employed during this procedure. A total of Versed 1.5 mg and Fentanyl 75 mcg was administered  intravenously. Moderate Sedation Time: 39 minutes. The patient's level of consciousness and vital signs were monitored continuously by radiology nursing throughout the procedure under my direct supervision. CONTRAST:  75mL OMNIPAQUE IOHEXOL 300 MG/ML  SOLN FLUOROSCOPY TIME:  Fluoroscopic dose; 579 mGy COMPLICATIONS: None immediate. PROCEDURE: Written informed consent was obtained after a discussion with the patient and/or patient's representative about the risks including, but not limited to, infection, bleeding, injury to the arteries and/or organs, and need for an additional procedure. A femoral approach was selected. The RIGHT femoral head was marked fluoroscopically, then the groin were prepped and draped using all elements of maximal sterile barrier technique. 1% lidocaine was used for local anesthesia. Limited ultrasound imaging of the RIGHT groin showed the femoral artery to be patent. An ultrasound image was saved and sent to PACS. Arterial access was obtained with a 21-G, 7-cm needle under direct ultrasound guidance, through which a 0.018-inch guidewire was advanced under fluoroscopy. The 21-G needle was then exchanged for a micropuncture catheter and a 0.035-inch Glidewire. The micropuncture catheter was exchanged for a 5 Fr sheath. A 5 Fr cobra 2 was used to catheterize the LEFT and RIGHT internal iliac arteries through which digital subtraction angiograms (DSA) were obtained. The RIGHT internal iliac artery was cannulated following Waltman loop technique. PURPOSE OF THE ARTERIOGRAM: No previous catheter-directed angiogram was available. Therefore a new complete diagnostic angiography was performed. The decision to proceed with an interventional procedure was made  SPINE Segmentation: 5 lumbar type vertebrae. Alignment: No evidence of traumatic malalignment. Vertebrae: No acute fracture. Paraspinal and other soft tissues: See above. Disc levels: Decompressive laminectomy at L3-L4 and L4-L5. Multilevel advanced spondylosis and facet arthropathy. IMPRESSION: 1. No acute intracranial abnormality. 2. Acute blow out fracture of the left orbit with fractures of the medial and inferior walls. Herniation of fat through the inferior wall. Correlate for entrapment. 3. Left  forehead/periorbital hematoma. 4. No acute fracture in the cervical spine. 5. No acute traumatic injury in the chest. 6. Large heterogenous pelvic mass measuring 11.9 x 12.4 x 14.9 cm likely adnexal or possibly uterine in origin. 7. Moderate-to-large hemoperitoneum greatest about the upper abdomen. The source of the hemorrhage is presumably the pelvic mass. 8. No acute fracture in the lumbar spine. Aortic Atherosclerosis (ICD10-I70.0). Critical Value/emergent results were called by telephone at the time of interpretation on 12/17/2022 at 11:51 pm to provider Dr Andrey Campanile, who verbally acknowledged these results. Electronically Signed   By: Minerva Fester M.D.   On: 12/09/2022 00:26   CT MAXILLOFACIAL WO CONTRAST  Result Date: 12/09/2022 CLINICAL DATA:  Ground level fall on blood thinners EXAM: CT HEAD WITHOUT CONTRAST CT MAXILLOFACIAL WITHOUT CONTRAST CT CERVICAL SPINE WITHOUT CONTRAST CT CHEST, ABDOMEN AND PELVIS WITH CONTRAST TECHNIQUE: Contiguous axial images were obtained from the base of the skull through the vertex without intravenous contrast. Multidetector CT imaging of the maxillofacial structures was performed. Multiplanar CT image reconstructions were also generated. A small metallic BB was placed on the right temple in order to reliably differentiate right from left. Multidetector CT imaging of the cervical spine was performed without intravenous contrast. Multiplanar CT image reconstructions were also generated. Multidetector CT imaging of the chest, abdomen and pelvis was performed following the standard protocol during bolus administration of intravenous contrast. RADIATION DOSE REDUCTION: This exam was performed according to the departmental dose-optimization program which includes automated exposure control, adjustment of the mA and/or kV according to patient size and/or use of iterative reconstruction technique. CONTRAST:  75mL OMNIPAQUE IOHEXOL 350 MG/ML SOLN COMPARISON:  Renal ultrasound  11/17/2022; CT head and cervical spine 11/18/2018; CT 12/22/2012 FINDINGS: CT HEAD FINDINGS Brain: No evidence of acute infarction, hemorrhage, hydrocephalus, extra-axial collection or mass lesion/mass effect. Vascular: No hyperdense vessel or unexpected calcification. Skull: Normal. Negative for fracture or focal lesion. Other: None. CT MAXILLOFACIAL FINDINGS Osseous: Fracture of the left orbit with fractures of the medial and inferior walls. Herniation of fat into the left maxillary sinus through the inferior floor defect. Orbits: The globes are intact.  No postseptal hematoma. Sinuses: About products in the left maxillary sinus. The sinuses are otherwise well aerated. No mastoid effusion. Soft tissues: Left forehead/periorbital hematoma. CT CERVICAL SPINE FINDINGS Alignment: No evidence of traumatic malalignment. Skull base and vertebrae: No acute fracture. Soft tissues and spinal canal: Thickening of the prevertebral soft tissues due to medialized carotid arteries posterior to the esophagus. No prevertebral swelling or hematoma. Disc levels: Age-related spondylosis greatest at C4-C5 and C5-C6 where it is moderate. Posterior disc osteophyte complex at C4-C5 and C5-C6 cause mild effacement the ventral thecal sac. No severe spinal canal narrowing. Other: Carotid calcification. CT CHEST FINDINGS Cardiovascular: No pericardial effusion. No evidence of aortic injury. Coronary artery and aortic atherosclerotic calcification. Mediastinum/Nodes: Trachea and esophagus are unremarkable. No mediastinal hematoma. Lungs/Pleura: Scattered scarring/atelectasis. No focal consolidation, pleural effusion, or pneumothorax. Musculoskeletal: No acute fracture.  Remote fracture of the sternum. CT ABDOMEN PELVIS FINDINGS Hepatobiliary: No hepatic laceration or hematoma. Hepatic cysts.  SPINE Segmentation: 5 lumbar type vertebrae. Alignment: No evidence of traumatic malalignment. Vertebrae: No acute fracture. Paraspinal and other soft tissues: See above. Disc levels: Decompressive laminectomy at L3-L4 and L4-L5. Multilevel advanced spondylosis and facet arthropathy. IMPRESSION: 1. No acute intracranial abnormality. 2. Acute blow out fracture of the left orbit with fractures of the medial and inferior walls. Herniation of fat through the inferior wall. Correlate for entrapment. 3. Left  forehead/periorbital hematoma. 4. No acute fracture in the cervical spine. 5. No acute traumatic injury in the chest. 6. Large heterogenous pelvic mass measuring 11.9 x 12.4 x 14.9 cm likely adnexal or possibly uterine in origin. 7. Moderate-to-large hemoperitoneum greatest about the upper abdomen. The source of the hemorrhage is presumably the pelvic mass. 8. No acute fracture in the lumbar spine. Aortic Atherosclerosis (ICD10-I70.0). Critical Value/emergent results were called by telephone at the time of interpretation on 12/17/2022 at 11:51 pm to provider Dr Andrey Campanile, who verbally acknowledged these results. Electronically Signed   By: Minerva Fester M.D.   On: 12/09/2022 00:26   CT MAXILLOFACIAL WO CONTRAST  Result Date: 12/09/2022 CLINICAL DATA:  Ground level fall on blood thinners EXAM: CT HEAD WITHOUT CONTRAST CT MAXILLOFACIAL WITHOUT CONTRAST CT CERVICAL SPINE WITHOUT CONTRAST CT CHEST, ABDOMEN AND PELVIS WITH CONTRAST TECHNIQUE: Contiguous axial images were obtained from the base of the skull through the vertex without intravenous contrast. Multidetector CT imaging of the maxillofacial structures was performed. Multiplanar CT image reconstructions were also generated. A small metallic BB was placed on the right temple in order to reliably differentiate right from left. Multidetector CT imaging of the cervical spine was performed without intravenous contrast. Multiplanar CT image reconstructions were also generated. Multidetector CT imaging of the chest, abdomen and pelvis was performed following the standard protocol during bolus administration of intravenous contrast. RADIATION DOSE REDUCTION: This exam was performed according to the departmental dose-optimization program which includes automated exposure control, adjustment of the mA and/or kV according to patient size and/or use of iterative reconstruction technique. CONTRAST:  75mL OMNIPAQUE IOHEXOL 350 MG/ML SOLN COMPARISON:  Renal ultrasound  11/17/2022; CT head and cervical spine 11/18/2018; CT 12/22/2012 FINDINGS: CT HEAD FINDINGS Brain: No evidence of acute infarction, hemorrhage, hydrocephalus, extra-axial collection or mass lesion/mass effect. Vascular: No hyperdense vessel or unexpected calcification. Skull: Normal. Negative for fracture or focal lesion. Other: None. CT MAXILLOFACIAL FINDINGS Osseous: Fracture of the left orbit with fractures of the medial and inferior walls. Herniation of fat into the left maxillary sinus through the inferior floor defect. Orbits: The globes are intact.  No postseptal hematoma. Sinuses: About products in the left maxillary sinus. The sinuses are otherwise well aerated. No mastoid effusion. Soft tissues: Left forehead/periorbital hematoma. CT CERVICAL SPINE FINDINGS Alignment: No evidence of traumatic malalignment. Skull base and vertebrae: No acute fracture. Soft tissues and spinal canal: Thickening of the prevertebral soft tissues due to medialized carotid arteries posterior to the esophagus. No prevertebral swelling or hematoma. Disc levels: Age-related spondylosis greatest at C4-C5 and C5-C6 where it is moderate. Posterior disc osteophyte complex at C4-C5 and C5-C6 cause mild effacement the ventral thecal sac. No severe spinal canal narrowing. Other: Carotid calcification. CT CHEST FINDINGS Cardiovascular: No pericardial effusion. No evidence of aortic injury. Coronary artery and aortic atherosclerotic calcification. Mediastinum/Nodes: Trachea and esophagus are unremarkable. No mediastinal hematoma. Lungs/Pleura: Scattered scarring/atelectasis. No focal consolidation, pleural effusion, or pneumothorax. Musculoskeletal: No acute fracture.  Remote fracture of the sternum. CT ABDOMEN PELVIS FINDINGS Hepatobiliary: No hepatic laceration or hematoma. Hepatic cysts.  Subjective/Chief Complaint: Denies abdominal pain this morning Has taken in minimal po Had reported BM   Objective: Vital signs in last 24 hours: Temp:  [97.4 F (36.3 C)-98.7 F (37.1 C)] 98.3 F (36.8 C) (10/12 0704) Pulse Rate:  [101-151] 131 (10/12 0400) Resp:  [10-44] 21 (10/12 0400) BP: (76-137)/(30-118) 102/66 (10/12 0400) SpO2:  [73 %-100 %] 94 % (10/12 0400) FiO2 (%):  [28 %] 28 % (10/12 0400) Last BM Date : 12/09/22  Intake/Output from previous day: 10/11 0701 - 10/12 0700 In: 1472.9 [I.V.:901.9; Blood:376; IV Piggyback:195] Out: 50 [Urine:50] Intake/Output this shift: No intake/output data recorded.  Exam: Awake, follows commands Looks comfortable No resp distress Abdomen full but soft, non-tender, no frank peritonitis  Lab Results:  Recent Labs    12/09/22 0252 12/09/22 1202 12/09/22 2249 12/10/22 0305  WBC 20.2*  --   --  28.8*  HGB 6.1*   < > 9.0* 9.1*  HCT 20.5*   < > 27.1* 27.4*  PLT 106*  --   --  116*   < > = values in this interval not displayed.   BMET Recent Labs    12/09/22 0252 12/10/22 0305  NA 135 140  K 4.1 4.5  CL 104 108  CO2 11* 15*  GLUCOSE 76 130*  BUN 28* 39*  CREATININE 2.26* 3.88*  CALCIUM 7.0* 8.1*   PT/INR Recent Labs    12/11/2022 2253 12/09/22 0252  LABPROT 20.1* 19.4*  INR 1.7* 1.6*   ABG No results for input(s): "PHART", "HCO3" in the last 72 hours.  Invalid input(s): "PCO2", "PO2"  Studies/Results: IR EMBO ART  VEN HEMORR LYMPH EXTRAV  INC GUIDE ROADMAPPING  Result Date: 12/09/2022 INDICATION: large pelvic mass with hemoperitoneum.  Trauma with reported fall. EXAM: Procedures: 1. PELVIC ARTERIOGRAPHY 2. BILATERAL INTERNAL ILIAC ARTERY EMBOLIZATION MEDICATIONS: Ancef 2 gm IV. The antibiotic was administered within one hour of the procedure ANESTHESIA/SEDATION: Moderate (conscious) sedation was employed during this procedure. A total of Versed 1.5 mg and Fentanyl 75 mcg was administered  intravenously. Moderate Sedation Time: 39 minutes. The patient's level of consciousness and vital signs were monitored continuously by radiology nursing throughout the procedure under my direct supervision. CONTRAST:  75mL OMNIPAQUE IOHEXOL 300 MG/ML  SOLN FLUOROSCOPY TIME:  Fluoroscopic dose; 579 mGy COMPLICATIONS: None immediate. PROCEDURE: Written informed consent was obtained after a discussion with the patient and/or patient's representative about the risks including, but not limited to, infection, bleeding, injury to the arteries and/or organs, and need for an additional procedure. A femoral approach was selected. The RIGHT femoral head was marked fluoroscopically, then the groin were prepped and draped using all elements of maximal sterile barrier technique. 1% lidocaine was used for local anesthesia. Limited ultrasound imaging of the RIGHT groin showed the femoral artery to be patent. An ultrasound image was saved and sent to PACS. Arterial access was obtained with a 21-G, 7-cm needle under direct ultrasound guidance, through which a 0.018-inch guidewire was advanced under fluoroscopy. The 21-G needle was then exchanged for a micropuncture catheter and a 0.035-inch Glidewire. The micropuncture catheter was exchanged for a 5 Fr sheath. A 5 Fr cobra 2 was used to catheterize the LEFT and RIGHT internal iliac arteries through which digital subtraction angiograms (DSA) were obtained. The RIGHT internal iliac artery was cannulated following Waltman loop technique. PURPOSE OF THE ARTERIOGRAM: No previous catheter-directed angiogram was available. Therefore a new complete diagnostic angiography was performed. The decision to proceed with an interventional procedure was made  SPINE Segmentation: 5 lumbar type vertebrae. Alignment: No evidence of traumatic malalignment. Vertebrae: No acute fracture. Paraspinal and other soft tissues: See above. Disc levels: Decompressive laminectomy at L3-L4 and L4-L5. Multilevel advanced spondylosis and facet arthropathy. IMPRESSION: 1. No acute intracranial abnormality. 2. Acute blow out fracture of the left orbit with fractures of the medial and inferior walls. Herniation of fat through the inferior wall. Correlate for entrapment. 3. Left  forehead/periorbital hematoma. 4. No acute fracture in the cervical spine. 5. No acute traumatic injury in the chest. 6. Large heterogenous pelvic mass measuring 11.9 x 12.4 x 14.9 cm likely adnexal or possibly uterine in origin. 7. Moderate-to-large hemoperitoneum greatest about the upper abdomen. The source of the hemorrhage is presumably the pelvic mass. 8. No acute fracture in the lumbar spine. Aortic Atherosclerosis (ICD10-I70.0). Critical Value/emergent results were called by telephone at the time of interpretation on 12/17/2022 at 11:51 pm to provider Dr Andrey Campanile, who verbally acknowledged these results. Electronically Signed   By: Minerva Fester M.D.   On: 12/09/2022 00:26   CT MAXILLOFACIAL WO CONTRAST  Result Date: 12/09/2022 CLINICAL DATA:  Ground level fall on blood thinners EXAM: CT HEAD WITHOUT CONTRAST CT MAXILLOFACIAL WITHOUT CONTRAST CT CERVICAL SPINE WITHOUT CONTRAST CT CHEST, ABDOMEN AND PELVIS WITH CONTRAST TECHNIQUE: Contiguous axial images were obtained from the base of the skull through the vertex without intravenous contrast. Multidetector CT imaging of the maxillofacial structures was performed. Multiplanar CT image reconstructions were also generated. A small metallic BB was placed on the right temple in order to reliably differentiate right from left. Multidetector CT imaging of the cervical spine was performed without intravenous contrast. Multiplanar CT image reconstructions were also generated. Multidetector CT imaging of the chest, abdomen and pelvis was performed following the standard protocol during bolus administration of intravenous contrast. RADIATION DOSE REDUCTION: This exam was performed according to the departmental dose-optimization program which includes automated exposure control, adjustment of the mA and/or kV according to patient size and/or use of iterative reconstruction technique. CONTRAST:  75mL OMNIPAQUE IOHEXOL 350 MG/ML SOLN COMPARISON:  Renal ultrasound  11/17/2022; CT head and cervical spine 11/18/2018; CT 12/22/2012 FINDINGS: CT HEAD FINDINGS Brain: No evidence of acute infarction, hemorrhage, hydrocephalus, extra-axial collection or mass lesion/mass effect. Vascular: No hyperdense vessel or unexpected calcification. Skull: Normal. Negative for fracture or focal lesion. Other: None. CT MAXILLOFACIAL FINDINGS Osseous: Fracture of the left orbit with fractures of the medial and inferior walls. Herniation of fat into the left maxillary sinus through the inferior floor defect. Orbits: The globes are intact.  No postseptal hematoma. Sinuses: About products in the left maxillary sinus. The sinuses are otherwise well aerated. No mastoid effusion. Soft tissues: Left forehead/periorbital hematoma. CT CERVICAL SPINE FINDINGS Alignment: No evidence of traumatic malalignment. Skull base and vertebrae: No acute fracture. Soft tissues and spinal canal: Thickening of the prevertebral soft tissues due to medialized carotid arteries posterior to the esophagus. No prevertebral swelling or hematoma. Disc levels: Age-related spondylosis greatest at C4-C5 and C5-C6 where it is moderate. Posterior disc osteophyte complex at C4-C5 and C5-C6 cause mild effacement the ventral thecal sac. No severe spinal canal narrowing. Other: Carotid calcification. CT CHEST FINDINGS Cardiovascular: No pericardial effusion. No evidence of aortic injury. Coronary artery and aortic atherosclerotic calcification. Mediastinum/Nodes: Trachea and esophagus are unremarkable. No mediastinal hematoma. Lungs/Pleura: Scattered scarring/atelectasis. No focal consolidation, pleural effusion, or pneumothorax. Musculoskeletal: No acute fracture.  Remote fracture of the sternum. CT ABDOMEN PELVIS FINDINGS Hepatobiliary: No hepatic laceration or hematoma. Hepatic cysts.  Subjective/Chief Complaint: Denies abdominal pain this morning Has taken in minimal po Had reported BM   Objective: Vital signs in last 24 hours: Temp:  [97.4 F (36.3 C)-98.7 F (37.1 C)] 98.3 F (36.8 C) (10/12 0704) Pulse Rate:  [101-151] 131 (10/12 0400) Resp:  [10-44] 21 (10/12 0400) BP: (76-137)/(30-118) 102/66 (10/12 0400) SpO2:  [73 %-100 %] 94 % (10/12 0400) FiO2 (%):  [28 %] 28 % (10/12 0400) Last BM Date : 12/09/22  Intake/Output from previous day: 10/11 0701 - 10/12 0700 In: 1472.9 [I.V.:901.9; Blood:376; IV Piggyback:195] Out: 50 [Urine:50] Intake/Output this shift: No intake/output data recorded.  Exam: Awake, follows commands Looks comfortable No resp distress Abdomen full but soft, non-tender, no frank peritonitis  Lab Results:  Recent Labs    12/09/22 0252 12/09/22 1202 12/09/22 2249 12/10/22 0305  WBC 20.2*  --   --  28.8*  HGB 6.1*   < > 9.0* 9.1*  HCT 20.5*   < > 27.1* 27.4*  PLT 106*  --   --  116*   < > = values in this interval not displayed.   BMET Recent Labs    12/09/22 0252 12/10/22 0305  NA 135 140  K 4.1 4.5  CL 104 108  CO2 11* 15*  GLUCOSE 76 130*  BUN 28* 39*  CREATININE 2.26* 3.88*  CALCIUM 7.0* 8.1*   PT/INR Recent Labs    12/11/2022 2253 12/09/22 0252  LABPROT 20.1* 19.4*  INR 1.7* 1.6*   ABG No results for input(s): "PHART", "HCO3" in the last 72 hours.  Invalid input(s): "PCO2", "PO2"  Studies/Results: IR EMBO ART  VEN HEMORR LYMPH EXTRAV  INC GUIDE ROADMAPPING  Result Date: 12/09/2022 INDICATION: large pelvic mass with hemoperitoneum.  Trauma with reported fall. EXAM: Procedures: 1. PELVIC ARTERIOGRAPHY 2. BILATERAL INTERNAL ILIAC ARTERY EMBOLIZATION MEDICATIONS: Ancef 2 gm IV. The antibiotic was administered within one hour of the procedure ANESTHESIA/SEDATION: Moderate (conscious) sedation was employed during this procedure. A total of Versed 1.5 mg and Fentanyl 75 mcg was administered  intravenously. Moderate Sedation Time: 39 minutes. The patient's level of consciousness and vital signs were monitored continuously by radiology nursing throughout the procedure under my direct supervision. CONTRAST:  75mL OMNIPAQUE IOHEXOL 300 MG/ML  SOLN FLUOROSCOPY TIME:  Fluoroscopic dose; 579 mGy COMPLICATIONS: None immediate. PROCEDURE: Written informed consent was obtained after a discussion with the patient and/or patient's representative about the risks including, but not limited to, infection, bleeding, injury to the arteries and/or organs, and need for an additional procedure. A femoral approach was selected. The RIGHT femoral head was marked fluoroscopically, then the groin were prepped and draped using all elements of maximal sterile barrier technique. 1% lidocaine was used for local anesthesia. Limited ultrasound imaging of the RIGHT groin showed the femoral artery to be patent. An ultrasound image was saved and sent to PACS. Arterial access was obtained with a 21-G, 7-cm needle under direct ultrasound guidance, through which a 0.018-inch guidewire was advanced under fluoroscopy. The 21-G needle was then exchanged for a micropuncture catheter and a 0.035-inch Glidewire. The micropuncture catheter was exchanged for a 5 Fr sheath. A 5 Fr cobra 2 was used to catheterize the LEFT and RIGHT internal iliac arteries through which digital subtraction angiograms (DSA) were obtained. The RIGHT internal iliac artery was cannulated following Waltman loop technique. PURPOSE OF THE ARTERIOGRAM: No previous catheter-directed angiogram was available. Therefore a new complete diagnostic angiography was performed. The decision to proceed with an interventional procedure was made  SPINE Segmentation: 5 lumbar type vertebrae. Alignment: No evidence of traumatic malalignment. Vertebrae: No acute fracture. Paraspinal and other soft tissues: See above. Disc levels: Decompressive laminectomy at L3-L4 and L4-L5. Multilevel advanced spondylosis and facet arthropathy. IMPRESSION: 1. No acute intracranial abnormality. 2. Acute blow out fracture of the left orbit with fractures of the medial and inferior walls. Herniation of fat through the inferior wall. Correlate for entrapment. 3. Left  forehead/periorbital hematoma. 4. No acute fracture in the cervical spine. 5. No acute traumatic injury in the chest. 6. Large heterogenous pelvic mass measuring 11.9 x 12.4 x 14.9 cm likely adnexal or possibly uterine in origin. 7. Moderate-to-large hemoperitoneum greatest about the upper abdomen. The source of the hemorrhage is presumably the pelvic mass. 8. No acute fracture in the lumbar spine. Aortic Atherosclerosis (ICD10-I70.0). Critical Value/emergent results were called by telephone at the time of interpretation on 12/17/2022 at 11:51 pm to provider Dr Andrey Campanile, who verbally acknowledged these results. Electronically Signed   By: Minerva Fester M.D.   On: 12/09/2022 00:26   CT MAXILLOFACIAL WO CONTRAST  Result Date: 12/09/2022 CLINICAL DATA:  Ground level fall on blood thinners EXAM: CT HEAD WITHOUT CONTRAST CT MAXILLOFACIAL WITHOUT CONTRAST CT CERVICAL SPINE WITHOUT CONTRAST CT CHEST, ABDOMEN AND PELVIS WITH CONTRAST TECHNIQUE: Contiguous axial images were obtained from the base of the skull through the vertex without intravenous contrast. Multidetector CT imaging of the maxillofacial structures was performed. Multiplanar CT image reconstructions were also generated. A small metallic BB was placed on the right temple in order to reliably differentiate right from left. Multidetector CT imaging of the cervical spine was performed without intravenous contrast. Multiplanar CT image reconstructions were also generated. Multidetector CT imaging of the chest, abdomen and pelvis was performed following the standard protocol during bolus administration of intravenous contrast. RADIATION DOSE REDUCTION: This exam was performed according to the departmental dose-optimization program which includes automated exposure control, adjustment of the mA and/or kV according to patient size and/or use of iterative reconstruction technique. CONTRAST:  75mL OMNIPAQUE IOHEXOL 350 MG/ML SOLN COMPARISON:  Renal ultrasound  11/17/2022; CT head and cervical spine 11/18/2018; CT 12/22/2012 FINDINGS: CT HEAD FINDINGS Brain: No evidence of acute infarction, hemorrhage, hydrocephalus, extra-axial collection or mass lesion/mass effect. Vascular: No hyperdense vessel or unexpected calcification. Skull: Normal. Negative for fracture or focal lesion. Other: None. CT MAXILLOFACIAL FINDINGS Osseous: Fracture of the left orbit with fractures of the medial and inferior walls. Herniation of fat into the left maxillary sinus through the inferior floor defect. Orbits: The globes are intact.  No postseptal hematoma. Sinuses: About products in the left maxillary sinus. The sinuses are otherwise well aerated. No mastoid effusion. Soft tissues: Left forehead/periorbital hematoma. CT CERVICAL SPINE FINDINGS Alignment: No evidence of traumatic malalignment. Skull base and vertebrae: No acute fracture. Soft tissues and spinal canal: Thickening of the prevertebral soft tissues due to medialized carotid arteries posterior to the esophagus. No prevertebral swelling or hematoma. Disc levels: Age-related spondylosis greatest at C4-C5 and C5-C6 where it is moderate. Posterior disc osteophyte complex at C4-C5 and C5-C6 cause mild effacement the ventral thecal sac. No severe spinal canal narrowing. Other: Carotid calcification. CT CHEST FINDINGS Cardiovascular: No pericardial effusion. No evidence of aortic injury. Coronary artery and aortic atherosclerotic calcification. Mediastinum/Nodes: Trachea and esophagus are unremarkable. No mediastinal hematoma. Lungs/Pleura: Scattered scarring/atelectasis. No focal consolidation, pleural effusion, or pneumothorax. Musculoskeletal: No acute fracture.  Remote fracture of the sternum. CT ABDOMEN PELVIS FINDINGS Hepatobiliary: No hepatic laceration or hematoma. Hepatic cysts.

## 2022-12-10 NOTE — Progress Notes (Signed)
75mL OMNIPAQUE IOHEXOL 350 MG/ML SOLN COMPARISON:  Renal ultrasound 11/17/2022; CT head and cervical spine 11/18/2018; CT 12/22/2012 FINDINGS: CT HEAD FINDINGS Brain: No evidence of acute infarction, hemorrhage, hydrocephalus, extra-axial collection or mass lesion/mass effect. Vascular: No hyperdense vessel or unexpected calcification. Skull: Normal. Negative for fracture or focal lesion. Other: None. CT MAXILLOFACIAL FINDINGS Osseous: Fracture of the left orbit with fractures of the medial and inferior walls. Herniation of fat into the left maxillary sinus through the inferior floor defect. Orbits: The globes are intact.  No postseptal hematoma. Sinuses: About products in the left maxillary sinus. The sinuses are otherwise well aerated. No mastoid effusion. Soft tissues: Left forehead/periorbital hematoma. CT CERVICAL SPINE FINDINGS Alignment: No evidence of traumatic malalignment. Skull base and vertebrae: No acute fracture. Soft tissues and spinal canal: Thickening of the prevertebral soft tissues due to medialized carotid arteries posterior to the esophagus. No prevertebral swelling or hematoma. Disc levels: Age-related spondylosis greatest at C4-C5 and C5-C6 where it is moderate. Posterior disc osteophyte complex at C4-C5 and C5-C6 cause mild effacement  the ventral thecal sac. No severe spinal canal narrowing. Other: Carotid calcification. CT CHEST FINDINGS Cardiovascular: No pericardial effusion. No evidence of aortic injury. Coronary artery and aortic atherosclerotic calcification. Mediastinum/Nodes: Trachea and esophagus are unremarkable. No mediastinal hematoma. Lungs/Pleura: Scattered scarring/atelectasis. No focal consolidation, pleural effusion, or pneumothorax. Musculoskeletal: No acute fracture.  Remote fracture of the sternum. CT ABDOMEN PELVIS FINDINGS Hepatobiliary: No hepatic laceration or hematoma. Hepatic cysts. Unremarkable gallbladder and biliary tree. Pancreas: Unremarkable. Spleen: No splenic laceration or hematoma. Adrenals/Urinary Tract: No adrenal hemorrhage. No renal laceration or hematoma. Low-attenuation lesions in the kidneys are statistically likely to represent cysts. No follow-up is required. Unremarkable bladder. Stomach/Bowel: Normal caliber large and small bowel. Mild wall thickening of a loop of small bowel in the central mesentery likely reactive secondary to adjacent blood products. Decompressed stomach. There is some loss definition of the lateral wall of the gastric fundus (circa series 6/image 96). This is favored to represent silhouetting due to adjacent hemorrhage rather than perforation as there is no free intraperitoneal air. Colonic diverticulosis without diverticulitis. Vascular/Lymphatic: Aortic atherosclerosis. No enlarged abdominal or pelvic lymph nodes. Reproductive: Large heterogenous pelvic mass measures 11.9 x 12.4 x 14.9 cm. This exerts mass effect on the uterus on lateral view. No discrete ovaries are seen. Other: Moderate-to-large hemoperitoneum greatest about the upper abdomen. The source of the hemorrhage is presumably the pelvic mass. No free intraperitoneal air. Musculoskeletal: No acute fracture. CT LUMBAR SPINE Segmentation: 5 lumbar type vertebrae. Alignment: No evidence of traumatic malalignment.  Vertebrae: No acute fracture. Paraspinal and other soft tissues: See above. Disc levels: Decompressive laminectomy at L3-L4 and L4-L5. Multilevel advanced spondylosis and facet arthropathy. IMPRESSION: 1. No acute intracranial abnormality. 2. Acute blow out fracture of the left orbit with fractures of the medial and inferior walls. Herniation of fat through the inferior wall. Correlate for entrapment. 3. Left forehead/periorbital hematoma. 4. No acute fracture in the cervical spine. 5. No acute traumatic injury in the chest. 6. Large heterogenous pelvic mass measuring 11.9 x 12.4 x 14.9 cm likely adnexal or possibly uterine in origin. 7. Moderate-to-large hemoperitoneum greatest about the upper abdomen. The source of the hemorrhage is presumably the pelvic mass. 8. No acute fracture in the lumbar spine. Aortic Atherosclerosis (ICD10-I70.0). Critical Value/emergent results were called by telephone at the time of interpretation on 12/02/2022 at 11:51 pm to provider Dr Andrey Campanile, who verbally acknowledged these results. Electronically Signed   By: Joselyn Glassman  75mL OMNIPAQUE IOHEXOL 350 MG/ML SOLN COMPARISON:  Renal ultrasound 11/17/2022; CT head and cervical spine 11/18/2018; CT 12/22/2012 FINDINGS: CT HEAD FINDINGS Brain: No evidence of acute infarction, hemorrhage, hydrocephalus, extra-axial collection or mass lesion/mass effect. Vascular: No hyperdense vessel or unexpected calcification. Skull: Normal. Negative for fracture or focal lesion. Other: None. CT MAXILLOFACIAL FINDINGS Osseous: Fracture of the left orbit with fractures of the medial and inferior walls. Herniation of fat into the left maxillary sinus through the inferior floor defect. Orbits: The globes are intact.  No postseptal hematoma. Sinuses: About products in the left maxillary sinus. The sinuses are otherwise well aerated. No mastoid effusion. Soft tissues: Left forehead/periorbital hematoma. CT CERVICAL SPINE FINDINGS Alignment: No evidence of traumatic malalignment. Skull base and vertebrae: No acute fracture. Soft tissues and spinal canal: Thickening of the prevertebral soft tissues due to medialized carotid arteries posterior to the esophagus. No prevertebral swelling or hematoma. Disc levels: Age-related spondylosis greatest at C4-C5 and C5-C6 where it is moderate. Posterior disc osteophyte complex at C4-C5 and C5-C6 cause mild effacement  the ventral thecal sac. No severe spinal canal narrowing. Other: Carotid calcification. CT CHEST FINDINGS Cardiovascular: No pericardial effusion. No evidence of aortic injury. Coronary artery and aortic atherosclerotic calcification. Mediastinum/Nodes: Trachea and esophagus are unremarkable. No mediastinal hematoma. Lungs/Pleura: Scattered scarring/atelectasis. No focal consolidation, pleural effusion, or pneumothorax. Musculoskeletal: No acute fracture.  Remote fracture of the sternum. CT ABDOMEN PELVIS FINDINGS Hepatobiliary: No hepatic laceration or hematoma. Hepatic cysts. Unremarkable gallbladder and biliary tree. Pancreas: Unremarkable. Spleen: No splenic laceration or hematoma. Adrenals/Urinary Tract: No adrenal hemorrhage. No renal laceration or hematoma. Low-attenuation lesions in the kidneys are statistically likely to represent cysts. No follow-up is required. Unremarkable bladder. Stomach/Bowel: Normal caliber large and small bowel. Mild wall thickening of a loop of small bowel in the central mesentery likely reactive secondary to adjacent blood products. Decompressed stomach. There is some loss definition of the lateral wall of the gastric fundus (circa series 6/image 96). This is favored to represent silhouetting due to adjacent hemorrhage rather than perforation as there is no free intraperitoneal air. Colonic diverticulosis without diverticulitis. Vascular/Lymphatic: Aortic atherosclerosis. No enlarged abdominal or pelvic lymph nodes. Reproductive: Large heterogenous pelvic mass measures 11.9 x 12.4 x 14.9 cm. This exerts mass effect on the uterus on lateral view. No discrete ovaries are seen. Other: Moderate-to-large hemoperitoneum greatest about the upper abdomen. The source of the hemorrhage is presumably the pelvic mass. No free intraperitoneal air. Musculoskeletal: No acute fracture. CT LUMBAR SPINE Segmentation: 5 lumbar type vertebrae. Alignment: No evidence of traumatic malalignment.  Vertebrae: No acute fracture. Paraspinal and other soft tissues: See above. Disc levels: Decompressive laminectomy at L3-L4 and L4-L5. Multilevel advanced spondylosis and facet arthropathy. IMPRESSION: 1. No acute intracranial abnormality. 2. Acute blow out fracture of the left orbit with fractures of the medial and inferior walls. Herniation of fat through the inferior wall. Correlate for entrapment. 3. Left forehead/periorbital hematoma. 4. No acute fracture in the cervical spine. 5. No acute traumatic injury in the chest. 6. Large heterogenous pelvic mass measuring 11.9 x 12.4 x 14.9 cm likely adnexal or possibly uterine in origin. 7. Moderate-to-large hemoperitoneum greatest about the upper abdomen. The source of the hemorrhage is presumably the pelvic mass. 8. No acute fracture in the lumbar spine. Aortic Atherosclerosis (ICD10-I70.0). Critical Value/emergent results were called by telephone at the time of interpretation on 12/02/2022 at 11:51 pm to provider Dr Andrey Campanile, who verbally acknowledged these results. Electronically Signed   By: Joselyn Glassman  75mL OMNIPAQUE IOHEXOL 350 MG/ML SOLN COMPARISON:  Renal ultrasound 11/17/2022; CT head and cervical spine 11/18/2018; CT 12/22/2012 FINDINGS: CT HEAD FINDINGS Brain: No evidence of acute infarction, hemorrhage, hydrocephalus, extra-axial collection or mass lesion/mass effect. Vascular: No hyperdense vessel or unexpected calcification. Skull: Normal. Negative for fracture or focal lesion. Other: None. CT MAXILLOFACIAL FINDINGS Osseous: Fracture of the left orbit with fractures of the medial and inferior walls. Herniation of fat into the left maxillary sinus through the inferior floor defect. Orbits: The globes are intact.  No postseptal hematoma. Sinuses: About products in the left maxillary sinus. The sinuses are otherwise well aerated. No mastoid effusion. Soft tissues: Left forehead/periorbital hematoma. CT CERVICAL SPINE FINDINGS Alignment: No evidence of traumatic malalignment. Skull base and vertebrae: No acute fracture. Soft tissues and spinal canal: Thickening of the prevertebral soft tissues due to medialized carotid arteries posterior to the esophagus. No prevertebral swelling or hematoma. Disc levels: Age-related spondylosis greatest at C4-C5 and C5-C6 where it is moderate. Posterior disc osteophyte complex at C4-C5 and C5-C6 cause mild effacement  the ventral thecal sac. No severe spinal canal narrowing. Other: Carotid calcification. CT CHEST FINDINGS Cardiovascular: No pericardial effusion. No evidence of aortic injury. Coronary artery and aortic atherosclerotic calcification. Mediastinum/Nodes: Trachea and esophagus are unremarkable. No mediastinal hematoma. Lungs/Pleura: Scattered scarring/atelectasis. No focal consolidation, pleural effusion, or pneumothorax. Musculoskeletal: No acute fracture.  Remote fracture of the sternum. CT ABDOMEN PELVIS FINDINGS Hepatobiliary: No hepatic laceration or hematoma. Hepatic cysts. Unremarkable gallbladder and biliary tree. Pancreas: Unremarkable. Spleen: No splenic laceration or hematoma. Adrenals/Urinary Tract: No adrenal hemorrhage. No renal laceration or hematoma. Low-attenuation lesions in the kidneys are statistically likely to represent cysts. No follow-up is required. Unremarkable bladder. Stomach/Bowel: Normal caliber large and small bowel. Mild wall thickening of a loop of small bowel in the central mesentery likely reactive secondary to adjacent blood products. Decompressed stomach. There is some loss definition of the lateral wall of the gastric fundus (circa series 6/image 96). This is favored to represent silhouetting due to adjacent hemorrhage rather than perforation as there is no free intraperitoneal air. Colonic diverticulosis without diverticulitis. Vascular/Lymphatic: Aortic atherosclerosis. No enlarged abdominal or pelvic lymph nodes. Reproductive: Large heterogenous pelvic mass measures 11.9 x 12.4 x 14.9 cm. This exerts mass effect on the uterus on lateral view. No discrete ovaries are seen. Other: Moderate-to-large hemoperitoneum greatest about the upper abdomen. The source of the hemorrhage is presumably the pelvic mass. No free intraperitoneal air. Musculoskeletal: No acute fracture. CT LUMBAR SPINE Segmentation: 5 lumbar type vertebrae. Alignment: No evidence of traumatic malalignment.  Vertebrae: No acute fracture. Paraspinal and other soft tissues: See above. Disc levels: Decompressive laminectomy at L3-L4 and L4-L5. Multilevel advanced spondylosis and facet arthropathy. IMPRESSION: 1. No acute intracranial abnormality. 2. Acute blow out fracture of the left orbit with fractures of the medial and inferior walls. Herniation of fat through the inferior wall. Correlate for entrapment. 3. Left forehead/periorbital hematoma. 4. No acute fracture in the cervical spine. 5. No acute traumatic injury in the chest. 6. Large heterogenous pelvic mass measuring 11.9 x 12.4 x 14.9 cm likely adnexal or possibly uterine in origin. 7. Moderate-to-large hemoperitoneum greatest about the upper abdomen. The source of the hemorrhage is presumably the pelvic mass. 8. No acute fracture in the lumbar spine. Aortic Atherosclerosis (ICD10-I70.0). Critical Value/emergent results were called by telephone at the time of interpretation on 12/02/2022 at 11:51 pm to provider Dr Andrey Campanile, who verbally acknowledged these results. Electronically Signed   By: Joselyn Glassman  laceration or hematoma. Hepatic cysts. Unremarkable gallbladder and biliary tree. Pancreas:  Unremarkable. Spleen: No splenic laceration or hematoma. Adrenals/Urinary Tract: No adrenal hemorrhage. No renal laceration or hematoma. Low-attenuation lesions in the kidneys are statistically likely to represent cysts. No follow-up is required. Unremarkable bladder. Stomach/Bowel: Normal caliber large and small bowel. Mild wall thickening of a loop of small bowel in the central mesentery likely reactive secondary to adjacent blood products. Decompressed stomach. There is some loss definition of the lateral wall of the gastric fundus (circa series 6/image 96). This is favored to represent silhouetting due to adjacent hemorrhage rather than perforation as there is no free intraperitoneal air. Colonic diverticulosis without diverticulitis. Vascular/Lymphatic: Aortic atherosclerosis. No enlarged abdominal or pelvic lymph nodes. Reproductive: Large heterogenous pelvic mass measures 11.9 x 12.4 x 14.9 cm. This exerts mass effect on the uterus on lateral view. No discrete ovaries are seen. Other: Moderate-to-large hemoperitoneum greatest about the upper abdomen. The source of the hemorrhage is presumably the pelvic mass. No free intraperitoneal air. Musculoskeletal: No acute fracture. CT LUMBAR SPINE Segmentation: 5 lumbar type vertebrae. Alignment: No evidence of traumatic malalignment. Vertebrae: No acute fracture. Paraspinal and other soft tissues: See above. Disc levels: Decompressive laminectomy at L3-L4 and L4-L5. Multilevel advanced spondylosis and facet arthropathy. IMPRESSION: 1. No acute intracranial abnormality. 2. Acute blow out fracture of the left orbit with fractures of the medial and inferior walls. Herniation of fat through the inferior wall. Correlate for entrapment. 3. Left forehead/periorbital hematoma. 4. No acute fracture in the cervical spine. 5. No acute traumatic injury in the chest. 6. Large heterogenous pelvic mass measuring 11.9 x 12.4 x 14.9 cm likely adnexal or possibly uterine in origin. 7.  Moderate-to-large hemoperitoneum greatest about the upper abdomen. The source of the hemorrhage is presumably the pelvic mass. 8. No acute fracture in the lumbar spine. Aortic Atherosclerosis (ICD10-I70.0). Critical Value/emergent results were called by telephone at the time of interpretation on 12/02/2022 at 11:51 pm to provider Dr Andrey Campanile, who verbally acknowledged these results. Electronically Signed   By: Minerva Fester M.D.   On: 12/09/2022 00:26   CT CHEST ABDOMEN PELVIS W CONTRAST  Result Date: 12/09/2022 CLINICAL DATA:  Ground level fall on blood thinners EXAM: CT HEAD WITHOUT CONTRAST CT MAXILLOFACIAL WITHOUT CONTRAST CT CERVICAL SPINE WITHOUT CONTRAST CT CHEST, ABDOMEN AND PELVIS WITH CONTRAST TECHNIQUE: Contiguous axial images were obtained from the base of the skull through the vertex without intravenous contrast. Multidetector CT imaging of the maxillofacial structures was performed. Multiplanar CT image reconstructions were also generated. A small metallic BB was placed on the right temple in order to reliably differentiate right from left. Multidetector CT imaging of the cervical spine was performed without intravenous contrast. Multiplanar CT image reconstructions were also generated. Multidetector CT imaging of the chest, abdomen and pelvis was performed following the standard protocol during bolus administration of intravenous contrast. RADIATION DOSE REDUCTION: This exam was performed according to the departmental dose-optimization program which includes automated exposure control, adjustment of the mA and/or kV according to patient size and/or use of iterative reconstruction technique. CONTRAST:  75mL OMNIPAQUE IOHEXOL 350 MG/ML SOLN COMPARISON:  Renal ultrasound 11/17/2022; CT head and cervical spine 11/18/2018; CT 12/22/2012 FINDINGS: CT HEAD FINDINGS Brain: No evidence of acute infarction, hemorrhage, hydrocephalus, extra-axial collection or mass lesion/mass effect. Vascular: No  hyperdense vessel or unexpected calcification. Skull: Normal. Negative for fracture or focal lesion. Other: None. CT MAXILLOFACIAL FINDINGS Osseous: Fracture of the left orbit with fractures of the medial and  75mL OMNIPAQUE IOHEXOL 350 MG/ML SOLN COMPARISON:  Renal ultrasound 11/17/2022; CT head and cervical spine 11/18/2018; CT 12/22/2012 FINDINGS: CT HEAD FINDINGS Brain: No evidence of acute infarction, hemorrhage, hydrocephalus, extra-axial collection or mass lesion/mass effect. Vascular: No hyperdense vessel or unexpected calcification. Skull: Normal. Negative for fracture or focal lesion. Other: None. CT MAXILLOFACIAL FINDINGS Osseous: Fracture of the left orbit with fractures of the medial and inferior walls. Herniation of fat into the left maxillary sinus through the inferior floor defect. Orbits: The globes are intact.  No postseptal hematoma. Sinuses: About products in the left maxillary sinus. The sinuses are otherwise well aerated. No mastoid effusion. Soft tissues: Left forehead/periorbital hematoma. CT CERVICAL SPINE FINDINGS Alignment: No evidence of traumatic malalignment. Skull base and vertebrae: No acute fracture. Soft tissues and spinal canal: Thickening of the prevertebral soft tissues due to medialized carotid arteries posterior to the esophagus. No prevertebral swelling or hematoma. Disc levels: Age-related spondylosis greatest at C4-C5 and C5-C6 where it is moderate. Posterior disc osteophyte complex at C4-C5 and C5-C6 cause mild effacement  the ventral thecal sac. No severe spinal canal narrowing. Other: Carotid calcification. CT CHEST FINDINGS Cardiovascular: No pericardial effusion. No evidence of aortic injury. Coronary artery and aortic atherosclerotic calcification. Mediastinum/Nodes: Trachea and esophagus are unremarkable. No mediastinal hematoma. Lungs/Pleura: Scattered scarring/atelectasis. No focal consolidation, pleural effusion, or pneumothorax. Musculoskeletal: No acute fracture.  Remote fracture of the sternum. CT ABDOMEN PELVIS FINDINGS Hepatobiliary: No hepatic laceration or hematoma. Hepatic cysts. Unremarkable gallbladder and biliary tree. Pancreas: Unremarkable. Spleen: No splenic laceration or hematoma. Adrenals/Urinary Tract: No adrenal hemorrhage. No renal laceration or hematoma. Low-attenuation lesions in the kidneys are statistically likely to represent cysts. No follow-up is required. Unremarkable bladder. Stomach/Bowel: Normal caliber large and small bowel. Mild wall thickening of a loop of small bowel in the central mesentery likely reactive secondary to adjacent blood products. Decompressed stomach. There is some loss definition of the lateral wall of the gastric fundus (circa series 6/image 96). This is favored to represent silhouetting due to adjacent hemorrhage rather than perforation as there is no free intraperitoneal air. Colonic diverticulosis without diverticulitis. Vascular/Lymphatic: Aortic atherosclerosis. No enlarged abdominal or pelvic lymph nodes. Reproductive: Large heterogenous pelvic mass measures 11.9 x 12.4 x 14.9 cm. This exerts mass effect on the uterus on lateral view. No discrete ovaries are seen. Other: Moderate-to-large hemoperitoneum greatest about the upper abdomen. The source of the hemorrhage is presumably the pelvic mass. No free intraperitoneal air. Musculoskeletal: No acute fracture. CT LUMBAR SPINE Segmentation: 5 lumbar type vertebrae. Alignment: No evidence of traumatic malalignment.  Vertebrae: No acute fracture. Paraspinal and other soft tissues: See above. Disc levels: Decompressive laminectomy at L3-L4 and L4-L5. Multilevel advanced spondylosis and facet arthropathy. IMPRESSION: 1. No acute intracranial abnormality. 2. Acute blow out fracture of the left orbit with fractures of the medial and inferior walls. Herniation of fat through the inferior wall. Correlate for entrapment. 3. Left forehead/periorbital hematoma. 4. No acute fracture in the cervical spine. 5. No acute traumatic injury in the chest. 6. Large heterogenous pelvic mass measuring 11.9 x 12.4 x 14.9 cm likely adnexal or possibly uterine in origin. 7. Moderate-to-large hemoperitoneum greatest about the upper abdomen. The source of the hemorrhage is presumably the pelvic mass. 8. No acute fracture in the lumbar spine. Aortic Atherosclerosis (ICD10-I70.0). Critical Value/emergent results were called by telephone at the time of interpretation on 12/02/2022 at 11:51 pm to provider Dr Andrey Campanile, who verbally acknowledged these results. Electronically Signed   By: Joselyn Glassman  75mL OMNIPAQUE IOHEXOL 350 MG/ML SOLN COMPARISON:  Renal ultrasound 11/17/2022; CT head and cervical spine 11/18/2018; CT 12/22/2012 FINDINGS: CT HEAD FINDINGS Brain: No evidence of acute infarction, hemorrhage, hydrocephalus, extra-axial collection or mass lesion/mass effect. Vascular: No hyperdense vessel or unexpected calcification. Skull: Normal. Negative for fracture or focal lesion. Other: None. CT MAXILLOFACIAL FINDINGS Osseous: Fracture of the left orbit with fractures of the medial and inferior walls. Herniation of fat into the left maxillary sinus through the inferior floor defect. Orbits: The globes are intact.  No postseptal hematoma. Sinuses: About products in the left maxillary sinus. The sinuses are otherwise well aerated. No mastoid effusion. Soft tissues: Left forehead/periorbital hematoma. CT CERVICAL SPINE FINDINGS Alignment: No evidence of traumatic malalignment. Skull base and vertebrae: No acute fracture. Soft tissues and spinal canal: Thickening of the prevertebral soft tissues due to medialized carotid arteries posterior to the esophagus. No prevertebral swelling or hematoma. Disc levels: Age-related spondylosis greatest at C4-C5 and C5-C6 where it is moderate. Posterior disc osteophyte complex at C4-C5 and C5-C6 cause mild effacement  the ventral thecal sac. No severe spinal canal narrowing. Other: Carotid calcification. CT CHEST FINDINGS Cardiovascular: No pericardial effusion. No evidence of aortic injury. Coronary artery and aortic atherosclerotic calcification. Mediastinum/Nodes: Trachea and esophagus are unremarkable. No mediastinal hematoma. Lungs/Pleura: Scattered scarring/atelectasis. No focal consolidation, pleural effusion, or pneumothorax. Musculoskeletal: No acute fracture.  Remote fracture of the sternum. CT ABDOMEN PELVIS FINDINGS Hepatobiliary: No hepatic laceration or hematoma. Hepatic cysts. Unremarkable gallbladder and biliary tree. Pancreas: Unremarkable. Spleen: No splenic laceration or hematoma. Adrenals/Urinary Tract: No adrenal hemorrhage. No renal laceration or hematoma. Low-attenuation lesions in the kidneys are statistically likely to represent cysts. No follow-up is required. Unremarkable bladder. Stomach/Bowel: Normal caliber large and small bowel. Mild wall thickening of a loop of small bowel in the central mesentery likely reactive secondary to adjacent blood products. Decompressed stomach. There is some loss definition of the lateral wall of the gastric fundus (circa series 6/image 96). This is favored to represent silhouetting due to adjacent hemorrhage rather than perforation as there is no free intraperitoneal air. Colonic diverticulosis without diverticulitis. Vascular/Lymphatic: Aortic atherosclerosis. No enlarged abdominal or pelvic lymph nodes. Reproductive: Large heterogenous pelvic mass measures 11.9 x 12.4 x 14.9 cm. This exerts mass effect on the uterus on lateral view. No discrete ovaries are seen. Other: Moderate-to-large hemoperitoneum greatest about the upper abdomen. The source of the hemorrhage is presumably the pelvic mass. No free intraperitoneal air. Musculoskeletal: No acute fracture. CT LUMBAR SPINE Segmentation: 5 lumbar type vertebrae. Alignment: No evidence of traumatic malalignment.  Vertebrae: No acute fracture. Paraspinal and other soft tissues: See above. Disc levels: Decompressive laminectomy at L3-L4 and L4-L5. Multilevel advanced spondylosis and facet arthropathy. IMPRESSION: 1. No acute intracranial abnormality. 2. Acute blow out fracture of the left orbit with fractures of the medial and inferior walls. Herniation of fat through the inferior wall. Correlate for entrapment. 3. Left forehead/periorbital hematoma. 4. No acute fracture in the cervical spine. 5. No acute traumatic injury in the chest. 6. Large heterogenous pelvic mass measuring 11.9 x 12.4 x 14.9 cm likely adnexal or possibly uterine in origin. 7. Moderate-to-large hemoperitoneum greatest about the upper abdomen. The source of the hemorrhage is presumably the pelvic mass. 8. No acute fracture in the lumbar spine. Aortic Atherosclerosis (ICD10-I70.0). Critical Value/emergent results were called by telephone at the time of interpretation on 12/02/2022 at 11:51 pm to provider Dr Andrey Campanile, who verbally acknowledged these results. Electronically Signed   By: Joselyn Glassman  75mL OMNIPAQUE IOHEXOL 350 MG/ML SOLN COMPARISON:  Renal ultrasound 11/17/2022; CT head and cervical spine 11/18/2018; CT 12/22/2012 FINDINGS: CT HEAD FINDINGS Brain: No evidence of acute infarction, hemorrhage, hydrocephalus, extra-axial collection or mass lesion/mass effect. Vascular: No hyperdense vessel or unexpected calcification. Skull: Normal. Negative for fracture or focal lesion. Other: None. CT MAXILLOFACIAL FINDINGS Osseous: Fracture of the left orbit with fractures of the medial and inferior walls. Herniation of fat into the left maxillary sinus through the inferior floor defect. Orbits: The globes are intact.  No postseptal hematoma. Sinuses: About products in the left maxillary sinus. The sinuses are otherwise well aerated. No mastoid effusion. Soft tissues: Left forehead/periorbital hematoma. CT CERVICAL SPINE FINDINGS Alignment: No evidence of traumatic malalignment. Skull base and vertebrae: No acute fracture. Soft tissues and spinal canal: Thickening of the prevertebral soft tissues due to medialized carotid arteries posterior to the esophagus. No prevertebral swelling or hematoma. Disc levels: Age-related spondylosis greatest at C4-C5 and C5-C6 where it is moderate. Posterior disc osteophyte complex at C4-C5 and C5-C6 cause mild effacement  the ventral thecal sac. No severe spinal canal narrowing. Other: Carotid calcification. CT CHEST FINDINGS Cardiovascular: No pericardial effusion. No evidence of aortic injury. Coronary artery and aortic atherosclerotic calcification. Mediastinum/Nodes: Trachea and esophagus are unremarkable. No mediastinal hematoma. Lungs/Pleura: Scattered scarring/atelectasis. No focal consolidation, pleural effusion, or pneumothorax. Musculoskeletal: No acute fracture.  Remote fracture of the sternum. CT ABDOMEN PELVIS FINDINGS Hepatobiliary: No hepatic laceration or hematoma. Hepatic cysts. Unremarkable gallbladder and biliary tree. Pancreas: Unremarkable. Spleen: No splenic laceration or hematoma. Adrenals/Urinary Tract: No adrenal hemorrhage. No renal laceration or hematoma. Low-attenuation lesions in the kidneys are statistically likely to represent cysts. No follow-up is required. Unremarkable bladder. Stomach/Bowel: Normal caliber large and small bowel. Mild wall thickening of a loop of small bowel in the central mesentery likely reactive secondary to adjacent blood products. Decompressed stomach. There is some loss definition of the lateral wall of the gastric fundus (circa series 6/image 96). This is favored to represent silhouetting due to adjacent hemorrhage rather than perforation as there is no free intraperitoneal air. Colonic diverticulosis without diverticulitis. Vascular/Lymphatic: Aortic atherosclerosis. No enlarged abdominal or pelvic lymph nodes. Reproductive: Large heterogenous pelvic mass measures 11.9 x 12.4 x 14.9 cm. This exerts mass effect on the uterus on lateral view. No discrete ovaries are seen. Other: Moderate-to-large hemoperitoneum greatest about the upper abdomen. The source of the hemorrhage is presumably the pelvic mass. No free intraperitoneal air. Musculoskeletal: No acute fracture. CT LUMBAR SPINE Segmentation: 5 lumbar type vertebrae. Alignment: No evidence of traumatic malalignment.  Vertebrae: No acute fracture. Paraspinal and other soft tissues: See above. Disc levels: Decompressive laminectomy at L3-L4 and L4-L5. Multilevel advanced spondylosis and facet arthropathy. IMPRESSION: 1. No acute intracranial abnormality. 2. Acute blow out fracture of the left orbit with fractures of the medial and inferior walls. Herniation of fat through the inferior wall. Correlate for entrapment. 3. Left forehead/periorbital hematoma. 4. No acute fracture in the cervical spine. 5. No acute traumatic injury in the chest. 6. Large heterogenous pelvic mass measuring 11.9 x 12.4 x 14.9 cm likely adnexal or possibly uterine in origin. 7. Moderate-to-large hemoperitoneum greatest about the upper abdomen. The source of the hemorrhage is presumably the pelvic mass. 8. No acute fracture in the lumbar spine. Aortic Atherosclerosis (ICD10-I70.0). Critical Value/emergent results were called by telephone at the time of interpretation on 12/02/2022 at 11:51 pm to provider Dr Andrey Campanile, who verbally acknowledged these results. Electronically Signed   By: Joselyn Glassman  75mL OMNIPAQUE IOHEXOL 350 MG/ML SOLN COMPARISON:  Renal ultrasound 11/17/2022; CT head and cervical spine 11/18/2018; CT 12/22/2012 FINDINGS: CT HEAD FINDINGS Brain: No evidence of acute infarction, hemorrhage, hydrocephalus, extra-axial collection or mass lesion/mass effect. Vascular: No hyperdense vessel or unexpected calcification. Skull: Normal. Negative for fracture or focal lesion. Other: None. CT MAXILLOFACIAL FINDINGS Osseous: Fracture of the left orbit with fractures of the medial and inferior walls. Herniation of fat into the left maxillary sinus through the inferior floor defect. Orbits: The globes are intact.  No postseptal hematoma. Sinuses: About products in the left maxillary sinus. The sinuses are otherwise well aerated. No mastoid effusion. Soft tissues: Left forehead/periorbital hematoma. CT CERVICAL SPINE FINDINGS Alignment: No evidence of traumatic malalignment. Skull base and vertebrae: No acute fracture. Soft tissues and spinal canal: Thickening of the prevertebral soft tissues due to medialized carotid arteries posterior to the esophagus. No prevertebral swelling or hematoma. Disc levels: Age-related spondylosis greatest at C4-C5 and C5-C6 where it is moderate. Posterior disc osteophyte complex at C4-C5 and C5-C6 cause mild effacement  the ventral thecal sac. No severe spinal canal narrowing. Other: Carotid calcification. CT CHEST FINDINGS Cardiovascular: No pericardial effusion. No evidence of aortic injury. Coronary artery and aortic atherosclerotic calcification. Mediastinum/Nodes: Trachea and esophagus are unremarkable. No mediastinal hematoma. Lungs/Pleura: Scattered scarring/atelectasis. No focal consolidation, pleural effusion, or pneumothorax. Musculoskeletal: No acute fracture.  Remote fracture of the sternum. CT ABDOMEN PELVIS FINDINGS Hepatobiliary: No hepatic laceration or hematoma. Hepatic cysts. Unremarkable gallbladder and biliary tree. Pancreas: Unremarkable. Spleen: No splenic laceration or hematoma. Adrenals/Urinary Tract: No adrenal hemorrhage. No renal laceration or hematoma. Low-attenuation lesions in the kidneys are statistically likely to represent cysts. No follow-up is required. Unremarkable bladder. Stomach/Bowel: Normal caliber large and small bowel. Mild wall thickening of a loop of small bowel in the central mesentery likely reactive secondary to adjacent blood products. Decompressed stomach. There is some loss definition of the lateral wall of the gastric fundus (circa series 6/image 96). This is favored to represent silhouetting due to adjacent hemorrhage rather than perforation as there is no free intraperitoneal air. Colonic diverticulosis without diverticulitis. Vascular/Lymphatic: Aortic atherosclerosis. No enlarged abdominal or pelvic lymph nodes. Reproductive: Large heterogenous pelvic mass measures 11.9 x 12.4 x 14.9 cm. This exerts mass effect on the uterus on lateral view. No discrete ovaries are seen. Other: Moderate-to-large hemoperitoneum greatest about the upper abdomen. The source of the hemorrhage is presumably the pelvic mass. No free intraperitoneal air. Musculoskeletal: No acute fracture. CT LUMBAR SPINE Segmentation: 5 lumbar type vertebrae. Alignment: No evidence of traumatic malalignment.  Vertebrae: No acute fracture. Paraspinal and other soft tissues: See above. Disc levels: Decompressive laminectomy at L3-L4 and L4-L5. Multilevel advanced spondylosis and facet arthropathy. IMPRESSION: 1. No acute intracranial abnormality. 2. Acute blow out fracture of the left orbit with fractures of the medial and inferior walls. Herniation of fat through the inferior wall. Correlate for entrapment. 3. Left forehead/periorbital hematoma. 4. No acute fracture in the cervical spine. 5. No acute traumatic injury in the chest. 6. Large heterogenous pelvic mass measuring 11.9 x 12.4 x 14.9 cm likely adnexal or possibly uterine in origin. 7. Moderate-to-large hemoperitoneum greatest about the upper abdomen. The source of the hemorrhage is presumably the pelvic mass. 8. No acute fracture in the lumbar spine. Aortic Atherosclerosis (ICD10-I70.0). Critical Value/emergent results were called by telephone at the time of interpretation on 12/02/2022 at 11:51 pm to provider Dr Andrey Campanile, who verbally acknowledged these results. Electronically Signed   By: Joselyn Glassman  75mL OMNIPAQUE IOHEXOL 350 MG/ML SOLN COMPARISON:  Renal ultrasound 11/17/2022; CT head and cervical spine 11/18/2018; CT 12/22/2012 FINDINGS: CT HEAD FINDINGS Brain: No evidence of acute infarction, hemorrhage, hydrocephalus, extra-axial collection or mass lesion/mass effect. Vascular: No hyperdense vessel or unexpected calcification. Skull: Normal. Negative for fracture or focal lesion. Other: None. CT MAXILLOFACIAL FINDINGS Osseous: Fracture of the left orbit with fractures of the medial and inferior walls. Herniation of fat into the left maxillary sinus through the inferior floor defect. Orbits: The globes are intact.  No postseptal hematoma. Sinuses: About products in the left maxillary sinus. The sinuses are otherwise well aerated. No mastoid effusion. Soft tissues: Left forehead/periorbital hematoma. CT CERVICAL SPINE FINDINGS Alignment: No evidence of traumatic malalignment. Skull base and vertebrae: No acute fracture. Soft tissues and spinal canal: Thickening of the prevertebral soft tissues due to medialized carotid arteries posterior to the esophagus. No prevertebral swelling or hematoma. Disc levels: Age-related spondylosis greatest at C4-C5 and C5-C6 where it is moderate. Posterior disc osteophyte complex at C4-C5 and C5-C6 cause mild effacement  the ventral thecal sac. No severe spinal canal narrowing. Other: Carotid calcification. CT CHEST FINDINGS Cardiovascular: No pericardial effusion. No evidence of aortic injury. Coronary artery and aortic atherosclerotic calcification. Mediastinum/Nodes: Trachea and esophagus are unremarkable. No mediastinal hematoma. Lungs/Pleura: Scattered scarring/atelectasis. No focal consolidation, pleural effusion, or pneumothorax. Musculoskeletal: No acute fracture.  Remote fracture of the sternum. CT ABDOMEN PELVIS FINDINGS Hepatobiliary: No hepatic laceration or hematoma. Hepatic cysts. Unremarkable gallbladder and biliary tree. Pancreas: Unremarkable. Spleen: No splenic laceration or hematoma. Adrenals/Urinary Tract: No adrenal hemorrhage. No renal laceration or hematoma. Low-attenuation lesions in the kidneys are statistically likely to represent cysts. No follow-up is required. Unremarkable bladder. Stomach/Bowel: Normal caliber large and small bowel. Mild wall thickening of a loop of small bowel in the central mesentery likely reactive secondary to adjacent blood products. Decompressed stomach. There is some loss definition of the lateral wall of the gastric fundus (circa series 6/image 96). This is favored to represent silhouetting due to adjacent hemorrhage rather than perforation as there is no free intraperitoneal air. Colonic diverticulosis without diverticulitis. Vascular/Lymphatic: Aortic atherosclerosis. No enlarged abdominal or pelvic lymph nodes. Reproductive: Large heterogenous pelvic mass measures 11.9 x 12.4 x 14.9 cm. This exerts mass effect on the uterus on lateral view. No discrete ovaries are seen. Other: Moderate-to-large hemoperitoneum greatest about the upper abdomen. The source of the hemorrhage is presumably the pelvic mass. No free intraperitoneal air. Musculoskeletal: No acute fracture. CT LUMBAR SPINE Segmentation: 5 lumbar type vertebrae. Alignment: No evidence of traumatic malalignment.  Vertebrae: No acute fracture. Paraspinal and other soft tissues: See above. Disc levels: Decompressive laminectomy at L3-L4 and L4-L5. Multilevel advanced spondylosis and facet arthropathy. IMPRESSION: 1. No acute intracranial abnormality. 2. Acute blow out fracture of the left orbit with fractures of the medial and inferior walls. Herniation of fat through the inferior wall. Correlate for entrapment. 3. Left forehead/periorbital hematoma. 4. No acute fracture in the cervical spine. 5. No acute traumatic injury in the chest. 6. Large heterogenous pelvic mass measuring 11.9 x 12.4 x 14.9 cm likely adnexal or possibly uterine in origin. 7. Moderate-to-large hemoperitoneum greatest about the upper abdomen. The source of the hemorrhage is presumably the pelvic mass. 8. No acute fracture in the lumbar spine. Aortic Atherosclerosis (ICD10-I70.0). Critical Value/emergent results were called by telephone at the time of interpretation on 12/02/2022 at 11:51 pm to provider Dr Andrey Campanile, who verbally acknowledged these results. Electronically Signed   By: Joselyn Glassman  laceration or hematoma. Hepatic cysts. Unremarkable gallbladder and biliary tree. Pancreas:  Unremarkable. Spleen: No splenic laceration or hematoma. Adrenals/Urinary Tract: No adrenal hemorrhage. No renal laceration or hematoma. Low-attenuation lesions in the kidneys are statistically likely to represent cysts. No follow-up is required. Unremarkable bladder. Stomach/Bowel: Normal caliber large and small bowel. Mild wall thickening of a loop of small bowel in the central mesentery likely reactive secondary to adjacent blood products. Decompressed stomach. There is some loss definition of the lateral wall of the gastric fundus (circa series 6/image 96). This is favored to represent silhouetting due to adjacent hemorrhage rather than perforation as there is no free intraperitoneal air. Colonic diverticulosis without diverticulitis. Vascular/Lymphatic: Aortic atherosclerosis. No enlarged abdominal or pelvic lymph nodes. Reproductive: Large heterogenous pelvic mass measures 11.9 x 12.4 x 14.9 cm. This exerts mass effect on the uterus on lateral view. No discrete ovaries are seen. Other: Moderate-to-large hemoperitoneum greatest about the upper abdomen. The source of the hemorrhage is presumably the pelvic mass. No free intraperitoneal air. Musculoskeletal: No acute fracture. CT LUMBAR SPINE Segmentation: 5 lumbar type vertebrae. Alignment: No evidence of traumatic malalignment. Vertebrae: No acute fracture. Paraspinal and other soft tissues: See above. Disc levels: Decompressive laminectomy at L3-L4 and L4-L5. Multilevel advanced spondylosis and facet arthropathy. IMPRESSION: 1. No acute intracranial abnormality. 2. Acute blow out fracture of the left orbit with fractures of the medial and inferior walls. Herniation of fat through the inferior wall. Correlate for entrapment. 3. Left forehead/periorbital hematoma. 4. No acute fracture in the cervical spine. 5. No acute traumatic injury in the chest. 6. Large heterogenous pelvic mass measuring 11.9 x 12.4 x 14.9 cm likely adnexal or possibly uterine in origin. 7.  Moderate-to-large hemoperitoneum greatest about the upper abdomen. The source of the hemorrhage is presumably the pelvic mass. 8. No acute fracture in the lumbar spine. Aortic Atherosclerosis (ICD10-I70.0). Critical Value/emergent results were called by telephone at the time of interpretation on 12/02/2022 at 11:51 pm to provider Dr Andrey Campanile, who verbally acknowledged these results. Electronically Signed   By: Minerva Fester M.D.   On: 12/09/2022 00:26   CT CHEST ABDOMEN PELVIS W CONTRAST  Result Date: 12/09/2022 CLINICAL DATA:  Ground level fall on blood thinners EXAM: CT HEAD WITHOUT CONTRAST CT MAXILLOFACIAL WITHOUT CONTRAST CT CERVICAL SPINE WITHOUT CONTRAST CT CHEST, ABDOMEN AND PELVIS WITH CONTRAST TECHNIQUE: Contiguous axial images were obtained from the base of the skull through the vertex without intravenous contrast. Multidetector CT imaging of the maxillofacial structures was performed. Multiplanar CT image reconstructions were also generated. A small metallic BB was placed on the right temple in order to reliably differentiate right from left. Multidetector CT imaging of the cervical spine was performed without intravenous contrast. Multiplanar CT image reconstructions were also generated. Multidetector CT imaging of the chest, abdomen and pelvis was performed following the standard protocol during bolus administration of intravenous contrast. RADIATION DOSE REDUCTION: This exam was performed according to the departmental dose-optimization program which includes automated exposure control, adjustment of the mA and/or kV according to patient size and/or use of iterative reconstruction technique. CONTRAST:  75mL OMNIPAQUE IOHEXOL 350 MG/ML SOLN COMPARISON:  Renal ultrasound 11/17/2022; CT head and cervical spine 11/18/2018; CT 12/22/2012 FINDINGS: CT HEAD FINDINGS Brain: No evidence of acute infarction, hemorrhage, hydrocephalus, extra-axial collection or mass lesion/mass effect. Vascular: No  hyperdense vessel or unexpected calcification. Skull: Normal. Negative for fracture or focal lesion. Other: None. CT MAXILLOFACIAL FINDINGS Osseous: Fracture of the left orbit with fractures of the medial and  75mL OMNIPAQUE IOHEXOL 350 MG/ML SOLN COMPARISON:  Renal ultrasound 11/17/2022; CT head and cervical spine 11/18/2018; CT 12/22/2012 FINDINGS: CT HEAD FINDINGS Brain: No evidence of acute infarction, hemorrhage, hydrocephalus, extra-axial collection or mass lesion/mass effect. Vascular: No hyperdense vessel or unexpected calcification. Skull: Normal. Negative for fracture or focal lesion. Other: None. CT MAXILLOFACIAL FINDINGS Osseous: Fracture of the left orbit with fractures of the medial and inferior walls. Herniation of fat into the left maxillary sinus through the inferior floor defect. Orbits: The globes are intact.  No postseptal hematoma. Sinuses: About products in the left maxillary sinus. The sinuses are otherwise well aerated. No mastoid effusion. Soft tissues: Left forehead/periorbital hematoma. CT CERVICAL SPINE FINDINGS Alignment: No evidence of traumatic malalignment. Skull base and vertebrae: No acute fracture. Soft tissues and spinal canal: Thickening of the prevertebral soft tissues due to medialized carotid arteries posterior to the esophagus. No prevertebral swelling or hematoma. Disc levels: Age-related spondylosis greatest at C4-C5 and C5-C6 where it is moderate. Posterior disc osteophyte complex at C4-C5 and C5-C6 cause mild effacement  the ventral thecal sac. No severe spinal canal narrowing. Other: Carotid calcification. CT CHEST FINDINGS Cardiovascular: No pericardial effusion. No evidence of aortic injury. Coronary artery and aortic atherosclerotic calcification. Mediastinum/Nodes: Trachea and esophagus are unremarkable. No mediastinal hematoma. Lungs/Pleura: Scattered scarring/atelectasis. No focal consolidation, pleural effusion, or pneumothorax. Musculoskeletal: No acute fracture.  Remote fracture of the sternum. CT ABDOMEN PELVIS FINDINGS Hepatobiliary: No hepatic laceration or hematoma. Hepatic cysts. Unremarkable gallbladder and biliary tree. Pancreas: Unremarkable. Spleen: No splenic laceration or hematoma. Adrenals/Urinary Tract: No adrenal hemorrhage. No renal laceration or hematoma. Low-attenuation lesions in the kidneys are statistically likely to represent cysts. No follow-up is required. Unremarkable bladder. Stomach/Bowel: Normal caliber large and small bowel. Mild wall thickening of a loop of small bowel in the central mesentery likely reactive secondary to adjacent blood products. Decompressed stomach. There is some loss definition of the lateral wall of the gastric fundus (circa series 6/image 96). This is favored to represent silhouetting due to adjacent hemorrhage rather than perforation as there is no free intraperitoneal air. Colonic diverticulosis without diverticulitis. Vascular/Lymphatic: Aortic atherosclerosis. No enlarged abdominal or pelvic lymph nodes. Reproductive: Large heterogenous pelvic mass measures 11.9 x 12.4 x 14.9 cm. This exerts mass effect on the uterus on lateral view. No discrete ovaries are seen. Other: Moderate-to-large hemoperitoneum greatest about the upper abdomen. The source of the hemorrhage is presumably the pelvic mass. No free intraperitoneal air. Musculoskeletal: No acute fracture. CT LUMBAR SPINE Segmentation: 5 lumbar type vertebrae. Alignment: No evidence of traumatic malalignment.  Vertebrae: No acute fracture. Paraspinal and other soft tissues: See above. Disc levels: Decompressive laminectomy at L3-L4 and L4-L5. Multilevel advanced spondylosis and facet arthropathy. IMPRESSION: 1. No acute intracranial abnormality. 2. Acute blow out fracture of the left orbit with fractures of the medial and inferior walls. Herniation of fat through the inferior wall. Correlate for entrapment. 3. Left forehead/periorbital hematoma. 4. No acute fracture in the cervical spine. 5. No acute traumatic injury in the chest. 6. Large heterogenous pelvic mass measuring 11.9 x 12.4 x 14.9 cm likely adnexal or possibly uterine in origin. 7. Moderate-to-large hemoperitoneum greatest about the upper abdomen. The source of the hemorrhage is presumably the pelvic mass. 8. No acute fracture in the lumbar spine. Aortic Atherosclerosis (ICD10-I70.0). Critical Value/emergent results were called by telephone at the time of interpretation on 12/02/2022 at 11:51 pm to provider Dr Andrey Campanile, who verbally acknowledged these results. Electronically Signed   By: Joselyn Glassman  75mL OMNIPAQUE IOHEXOL 350 MG/ML SOLN COMPARISON:  Renal ultrasound 11/17/2022; CT head and cervical spine 11/18/2018; CT 12/22/2012 FINDINGS: CT HEAD FINDINGS Brain: No evidence of acute infarction, hemorrhage, hydrocephalus, extra-axial collection or mass lesion/mass effect. Vascular: No hyperdense vessel or unexpected calcification. Skull: Normal. Negative for fracture or focal lesion. Other: None. CT MAXILLOFACIAL FINDINGS Osseous: Fracture of the left orbit with fractures of the medial and inferior walls. Herniation of fat into the left maxillary sinus through the inferior floor defect. Orbits: The globes are intact.  No postseptal hematoma. Sinuses: About products in the left maxillary sinus. The sinuses are otherwise well aerated. No mastoid effusion. Soft tissues: Left forehead/periorbital hematoma. CT CERVICAL SPINE FINDINGS Alignment: No evidence of traumatic malalignment. Skull base and vertebrae: No acute fracture. Soft tissues and spinal canal: Thickening of the prevertebral soft tissues due to medialized carotid arteries posterior to the esophagus. No prevertebral swelling or hematoma. Disc levels: Age-related spondylosis greatest at C4-C5 and C5-C6 where it is moderate. Posterior disc osteophyte complex at C4-C5 and C5-C6 cause mild effacement  the ventral thecal sac. No severe spinal canal narrowing. Other: Carotid calcification. CT CHEST FINDINGS Cardiovascular: No pericardial effusion. No evidence of aortic injury. Coronary artery and aortic atherosclerotic calcification. Mediastinum/Nodes: Trachea and esophagus are unremarkable. No mediastinal hematoma. Lungs/Pleura: Scattered scarring/atelectasis. No focal consolidation, pleural effusion, or pneumothorax. Musculoskeletal: No acute fracture.  Remote fracture of the sternum. CT ABDOMEN PELVIS FINDINGS Hepatobiliary: No hepatic laceration or hematoma. Hepatic cysts. Unremarkable gallbladder and biliary tree. Pancreas: Unremarkable. Spleen: No splenic laceration or hematoma. Adrenals/Urinary Tract: No adrenal hemorrhage. No renal laceration or hematoma. Low-attenuation lesions in the kidneys are statistically likely to represent cysts. No follow-up is required. Unremarkable bladder. Stomach/Bowel: Normal caliber large and small bowel. Mild wall thickening of a loop of small bowel in the central mesentery likely reactive secondary to adjacent blood products. Decompressed stomach. There is some loss definition of the lateral wall of the gastric fundus (circa series 6/image 96). This is favored to represent silhouetting due to adjacent hemorrhage rather than perforation as there is no free intraperitoneal air. Colonic diverticulosis without diverticulitis. Vascular/Lymphatic: Aortic atherosclerosis. No enlarged abdominal or pelvic lymph nodes. Reproductive: Large heterogenous pelvic mass measures 11.9 x 12.4 x 14.9 cm. This exerts mass effect on the uterus on lateral view. No discrete ovaries are seen. Other: Moderate-to-large hemoperitoneum greatest about the upper abdomen. The source of the hemorrhage is presumably the pelvic mass. No free intraperitoneal air. Musculoskeletal: No acute fracture. CT LUMBAR SPINE Segmentation: 5 lumbar type vertebrae. Alignment: No evidence of traumatic malalignment.  Vertebrae: No acute fracture. Paraspinal and other soft tissues: See above. Disc levels: Decompressive laminectomy at L3-L4 and L4-L5. Multilevel advanced spondylosis and facet arthropathy. IMPRESSION: 1. No acute intracranial abnormality. 2. Acute blow out fracture of the left orbit with fractures of the medial and inferior walls. Herniation of fat through the inferior wall. Correlate for entrapment. 3. Left forehead/periorbital hematoma. 4. No acute fracture in the cervical spine. 5. No acute traumatic injury in the chest. 6. Large heterogenous pelvic mass measuring 11.9 x 12.4 x 14.9 cm likely adnexal or possibly uterine in origin. 7. Moderate-to-large hemoperitoneum greatest about the upper abdomen. The source of the hemorrhage is presumably the pelvic mass. 8. No acute fracture in the lumbar spine. Aortic Atherosclerosis (ICD10-I70.0). Critical Value/emergent results were called by telephone at the time of interpretation on 12/02/2022 at 11:51 pm to provider Dr Andrey Campanile, who verbally acknowledged these results. Electronically Signed   By: Joselyn Glassman  inferior walls. Herniation of fat into the left maxillary sinus through the inferior floor defect. Orbits: The globes are intact.  No postseptal hematoma. Sinuses: About products in the left maxillary sinus. The sinuses are otherwise well aerated. No mastoid effusion. Soft tissues: Left forehead/periorbital hematoma. CT CERVICAL SPINE FINDINGS Alignment: No evidence of traumatic malalignment. Skull base and vertebrae: No acute fracture. Soft tissues and spinal canal: Thickening of the prevertebral soft tissues due to medialized carotid arteries posterior to the esophagus. No prevertebral swelling or hematoma. Disc levels: Age-related spondylosis greatest at C4-C5 and C5-C6 where it is moderate. Posterior disc osteophyte complex at C4-C5 and C5-C6 cause mild effacement the ventral thecal sac. No severe spinal canal narrowing. Other: Carotid calcification. CT CHEST FINDINGS Cardiovascular: No pericardial effusion. No evidence of aortic injury. Coronary artery and aortic atherosclerotic calcification. Mediastinum/Nodes: Trachea and esophagus are unremarkable. No mediastinal hematoma. Lungs/Pleura: Scattered scarring/atelectasis. No focal consolidation, pleural effusion, or pneumothorax. Musculoskeletal: No acute fracture.  Remote fracture of the sternum. CT ABDOMEN PELVIS FINDINGS Hepatobiliary: No hepatic laceration or hematoma. Hepatic cysts. Unremarkable gallbladder and biliary tree. Pancreas: Unremarkable. Spleen: No splenic laceration or hematoma. Adrenals/Urinary Tract: No adrenal hemorrhage. No renal laceration or hematoma. Low-attenuation lesions in the kidneys are statistically likely to represent cysts. No follow-up is required. Unremarkable bladder. Stomach/Bowel: Normal caliber large and small bowel.  Mild wall thickening of a loop of small bowel in the central mesentery likely reactive secondary to adjacent blood products. Decompressed stomach. There is some loss definition of the lateral wall of the gastric fundus (circa series 6/image 96). This is favored to represent silhouetting due to adjacent hemorrhage rather than perforation as there is no free intraperitoneal air. Colonic diverticulosis without diverticulitis. Vascular/Lymphatic: Aortic atherosclerosis. No enlarged abdominal or pelvic lymph nodes. Reproductive: Large heterogenous pelvic mass measures 11.9 x 12.4 x 14.9 cm. This exerts mass effect on the uterus on lateral view. No discrete ovaries are seen. Other: Moderate-to-large hemoperitoneum greatest about the upper abdomen. The source of the hemorrhage is presumably the pelvic mass. No free intraperitoneal air. Musculoskeletal: No acute fracture. CT LUMBAR SPINE Segmentation: 5 lumbar type vertebrae. Alignment: No evidence of traumatic malalignment. Vertebrae: No acute fracture. Paraspinal and other soft tissues: See above. Disc levels: Decompressive laminectomy at L3-L4 and L4-L5. Multilevel advanced spondylosis and facet arthropathy. IMPRESSION: 1. No acute intracranial abnormality. 2. Acute blow out fracture of the left orbit with fractures of the medial and inferior walls. Herniation of fat through the inferior wall. Correlate for entrapment. 3. Left forehead/periorbital hematoma. 4. No acute fracture in the cervical spine. 5. No acute traumatic injury in the chest. 6. Large heterogenous pelvic mass measuring 11.9 x 12.4 x 14.9 cm likely adnexal or possibly uterine in origin. 7. Moderate-to-large hemoperitoneum greatest about the upper abdomen. The source of the hemorrhage is presumably the pelvic mass. 8. No acute fracture in the lumbar spine. Aortic Atherosclerosis (ICD10-I70.0). Critical Value/emergent results were called by telephone at the time of interpretation on 12/23/2022 at 11:51 pm  to provider Dr Andrey Campanile, who verbally acknowledged these results. Electronically Signed   By: Minerva Fester M.D.   On: 12/09/2022 00:26   CT L-SPINE NO CHARGE  Result Date: 12/09/2022 CLINICAL DATA:  Ground level fall on blood thinners EXAM: CT HEAD WITHOUT CONTRAST CT MAXILLOFACIAL WITHOUT CONTRAST CT CERVICAL SPINE WITHOUT CONTRAST CT CHEST, ABDOMEN AND PELVIS WITH CONTRAST TECHNIQUE: Contiguous axial images were obtained from the base of the skull through the vertex without intravenous contrast. Multidetector CT imaging of  inferior walls. Herniation of fat into the left maxillary sinus through the inferior floor defect. Orbits: The globes are intact.  No postseptal hematoma. Sinuses: About products in the left maxillary sinus. The sinuses are otherwise well aerated. No mastoid effusion. Soft tissues: Left forehead/periorbital hematoma. CT CERVICAL SPINE FINDINGS Alignment: No evidence of traumatic malalignment. Skull base and vertebrae: No acute fracture. Soft tissues and spinal canal: Thickening of the prevertebral soft tissues due to medialized carotid arteries posterior to the esophagus. No prevertebral swelling or hematoma. Disc levels: Age-related spondylosis greatest at C4-C5 and C5-C6 where it is moderate. Posterior disc osteophyte complex at C4-C5 and C5-C6 cause mild effacement the ventral thecal sac. No severe spinal canal narrowing. Other: Carotid calcification. CT CHEST FINDINGS Cardiovascular: No pericardial effusion. No evidence of aortic injury. Coronary artery and aortic atherosclerotic calcification. Mediastinum/Nodes: Trachea and esophagus are unremarkable. No mediastinal hematoma. Lungs/Pleura: Scattered scarring/atelectasis. No focal consolidation, pleural effusion, or pneumothorax. Musculoskeletal: No acute fracture.  Remote fracture of the sternum. CT ABDOMEN PELVIS FINDINGS Hepatobiliary: No hepatic laceration or hematoma. Hepatic cysts. Unremarkable gallbladder and biliary tree. Pancreas: Unremarkable. Spleen: No splenic laceration or hematoma. Adrenals/Urinary Tract: No adrenal hemorrhage. No renal laceration or hematoma. Low-attenuation lesions in the kidneys are statistically likely to represent cysts. No follow-up is required. Unremarkable bladder. Stomach/Bowel: Normal caliber large and small bowel.  Mild wall thickening of a loop of small bowel in the central mesentery likely reactive secondary to adjacent blood products. Decompressed stomach. There is some loss definition of the lateral wall of the gastric fundus (circa series 6/image 96). This is favored to represent silhouetting due to adjacent hemorrhage rather than perforation as there is no free intraperitoneal air. Colonic diverticulosis without diverticulitis. Vascular/Lymphatic: Aortic atherosclerosis. No enlarged abdominal or pelvic lymph nodes. Reproductive: Large heterogenous pelvic mass measures 11.9 x 12.4 x 14.9 cm. This exerts mass effect on the uterus on lateral view. No discrete ovaries are seen. Other: Moderate-to-large hemoperitoneum greatest about the upper abdomen. The source of the hemorrhage is presumably the pelvic mass. No free intraperitoneal air. Musculoskeletal: No acute fracture. CT LUMBAR SPINE Segmentation: 5 lumbar type vertebrae. Alignment: No evidence of traumatic malalignment. Vertebrae: No acute fracture. Paraspinal and other soft tissues: See above. Disc levels: Decompressive laminectomy at L3-L4 and L4-L5. Multilevel advanced spondylosis and facet arthropathy. IMPRESSION: 1. No acute intracranial abnormality. 2. Acute blow out fracture of the left orbit with fractures of the medial and inferior walls. Herniation of fat through the inferior wall. Correlate for entrapment. 3. Left forehead/periorbital hematoma. 4. No acute fracture in the cervical spine. 5. No acute traumatic injury in the chest. 6. Large heterogenous pelvic mass measuring 11.9 x 12.4 x 14.9 cm likely adnexal or possibly uterine in origin. 7. Moderate-to-large hemoperitoneum greatest about the upper abdomen. The source of the hemorrhage is presumably the pelvic mass. 8. No acute fracture in the lumbar spine. Aortic Atherosclerosis (ICD10-I70.0). Critical Value/emergent results were called by telephone at the time of interpretation on 12/23/2022 at 11:51 pm  to provider Dr Andrey Campanile, who verbally acknowledged these results. Electronically Signed   By: Minerva Fester M.D.   On: 12/09/2022 00:26   CT L-SPINE NO CHARGE  Result Date: 12/09/2022 CLINICAL DATA:  Ground level fall on blood thinners EXAM: CT HEAD WITHOUT CONTRAST CT MAXILLOFACIAL WITHOUT CONTRAST CT CERVICAL SPINE WITHOUT CONTRAST CT CHEST, ABDOMEN AND PELVIS WITH CONTRAST TECHNIQUE: Contiguous axial images were obtained from the base of the skull through the vertex without intravenous contrast. Multidetector CT imaging of

## 2022-12-11 DIAGNOSIS — R578 Other shock: Secondary | ICD-10-CM | POA: Diagnosis not present

## 2022-12-11 LAB — COMPREHENSIVE METABOLIC PANEL
ALT: 71 U/L — ABNORMAL HIGH (ref 0–44)
AST: 151 U/L — ABNORMAL HIGH (ref 15–41)
Albumin: 3.9 g/dL (ref 3.5–5.0)
Alkaline Phosphatase: 45 U/L (ref 38–126)
Anion gap: 17 — ABNORMAL HIGH (ref 5–15)
BUN: 48 mg/dL — ABNORMAL HIGH (ref 8–23)
CO2: 15 mmol/L — ABNORMAL LOW (ref 22–32)
Calcium: 7.5 mg/dL — ABNORMAL LOW (ref 8.9–10.3)
Chloride: 106 mmol/L (ref 98–111)
Creatinine, Ser: 5.41 mg/dL — ABNORMAL HIGH (ref 0.44–1.00)
GFR, Estimated: 8 mL/min — ABNORMAL LOW (ref >60)
Glucose, Bld: 131 mg/dL — ABNORMAL HIGH (ref 70–99)
Potassium: 4.8 mmol/L (ref 3.5–5.1)
Sodium: 138 mmol/L (ref 135–145)
Total Bilirubin: 0.7 mg/dL (ref 0.3–1.2)
Total Protein: 5.8 g/dL — ABNORMAL LOW (ref 6.5–8.1)

## 2022-12-11 LAB — POCT I-STAT 7, (LYTES, BLD GAS, ICA,H+H)
Acid-base deficit: 14 mmol/L — ABNORMAL HIGH (ref 0.0–2.0)
Bicarbonate: 14.3 mmol/L — ABNORMAL LOW (ref 20.0–28.0)
Calcium, Ion: 1.07 mmol/L — ABNORMAL LOW (ref 1.15–1.40)
HCT: 22 % — ABNORMAL LOW (ref 36.0–46.0)
Hemoglobin: 7.5 g/dL — ABNORMAL LOW (ref 12.0–15.0)
O2 Saturation: 80 %
Patient temperature: 97.6
Potassium: 4.8 mmol/L (ref 3.5–5.1)
Sodium: 137 mmol/L (ref 135–145)
TCO2: 16 mmol/L — ABNORMAL LOW (ref 22–32)
pCO2 arterial: 40.6 mm[Hg] (ref 32–48)
pH, Arterial: 7.153 — CL (ref 7.35–7.45)
pO2, Arterial: 55 mm[Hg] — ABNORMAL LOW (ref 83–108)

## 2022-12-11 LAB — CBC
HCT: 21.7 % — ABNORMAL LOW (ref 36.0–46.0)
Hemoglobin: 7 g/dL — ABNORMAL LOW (ref 12.0–15.0)
MCH: 30.3 pg (ref 26.0–34.0)
MCHC: 32.3 g/dL (ref 30.0–36.0)
MCV: 93.9 fL (ref 80.0–100.0)
Platelets: 100 10*3/uL — ABNORMAL LOW (ref 150–400)
RBC: 2.31 MIL/uL — ABNORMAL LOW (ref 3.87–5.11)
RDW: 17.6 % — ABNORMAL HIGH (ref 11.5–15.5)
WBC: 32 10*3/uL — ABNORMAL HIGH (ref 4.0–10.5)
nRBC: 0.9 % — ABNORMAL HIGH (ref 0.0–0.2)

## 2022-12-11 MED ORDER — SODIUM BICARBONATE 8.4 % IV SOLN
50.0000 meq | Freq: Once | INTRAVENOUS | Status: AC
  Administered 2022-12-11: 50 meq via INTRAVENOUS
  Filled 2022-12-11: qty 50

## 2022-12-11 MED ORDER — SODIUM BICARBONATE 650 MG PO TABS
1300.0000 mg | ORAL_TABLET | Freq: Two times a day (BID) | ORAL | Status: DC
  Administered 2022-12-11 – 2022-12-12 (×3): 1300 mg via ORAL
  Filled 2022-12-11 (×3): qty 2

## 2022-12-11 MED ORDER — METOPROLOL TARTRATE 50 MG PO TABS: 50.0000 mg | ORAL_TABLET | Freq: Two times a day (BID) | ORAL | Status: DC

## 2022-12-11 MED ORDER — IPRATROPIUM-ALBUTEROL 0.5-2.5 (3) MG/3ML IN SOLN
3.0000 mL | RESPIRATORY_TRACT | Status: DC | PRN
  Administered 2022-12-11: 3 mL via RESPIRATORY_TRACT
  Filled 2022-12-11: qty 3

## 2022-12-11 MED ORDER — METOPROLOL TARTRATE 25 MG PO TABS
25.0000 mg | ORAL_TABLET | Freq: Two times a day (BID) | ORAL | Status: DC
  Administered 2022-12-11 – 2022-12-12 (×2): 25 mg via ORAL
  Filled 2022-12-11 (×3): qty 1

## 2022-12-11 MED ORDER — ACETAMINOPHEN 325 MG PO TABS: 650.0000 mg | ORAL_TABLET | Freq: Four times a day (QID) | ORAL | Status: DC | PRN

## 2022-12-11 NOTE — Progress Notes (Signed)
eLink Physician-Brief Progress Note Patient Name: Tamara Larson DOB: [birth year] MRN: [id number]   Date of Service  12/11/2022  HPI/Events of Note  Bedside RN reports increased oxygen requirement with patient now on 12 L high flow nasal cannula.  O2 sats were reported as 88 to 91% with heart rate in the 130s.  eICU Interventions  .  Images and pertinent lab test reviewed.  Camera exam done.  Patient in mild respiratory distress.  Minimal accessory muscle use.  Oxygen saturation 95% with heart rate in the 1 teens. Will obtain a blood gas prior to making any decisions.  Bedside nurse updated.     Intervention Category Major Interventions: Hypoxemia - evaluation and management  Carilyn Goodpasture 12/11/2022, 6:16 AM

## 2022-12-11 NOTE — Progress Notes (Signed)
Patient with labored breathing again, sats in mid to high 80s. Increased oxygen up to 12L as I had come back down to 10L previously, lung sounds diminished on auscultation. Heart rate sustaining 120-140s. [facility] notified.

## 2022-12-11 NOTE — Progress Notes (Signed)
Subjective/Chief Complaint: Events of night noted She denies abdominal pain   Objective: Vital signs in last 24 hours: Temp:  [97.4 F (36.3 C)-98.3 F (36.8 C)] 97.6 F (36.4 C) (10/13 0714) Pulse Rate:  [100-142] 114 (10/13 0645) Resp:  [9-32] 13 (10/13 0645) BP: (68-131)/(53-100) 93/63 (10/13 0645) SpO2:  [85 %-100 %] 92 % (10/13 0645) Last BM Date : 12/09/22  Intake/Output from previous day: 10/12 0701 - 10/13 0700 In: 3072.8 [P.O.:240; I.V.:1014.6; IV Piggyback:1818.2] Out: 115 [Urine:115] Intake/Output this shift: No intake/output data recorded.  Exam: Awake but sleepy Follows commands Abdomen softer than yesterday, no peritonitis  Lab Results:  Recent Labs    12/09/22 0252 12/09/22 1202 12/09/22 2249 12/10/22 0305  WBC 20.2*  --   --  28.8*  HGB 6.1*   < > 9.0* 9.1*  HCT 20.5*   < > 27.1* 27.4*  PLT 106*  --   --  116*   < > = values in this interval not displayed.   BMET Recent Labs    12/10/22 0305 12/11/22 0303  NA 140 138  K 4.5 4.8  CL 108 106  CO2 15* 15*  GLUCOSE 130* 131*  BUN 39* 48*  CREATININE 3.88* 5.41*  CALCIUM 8.1* 7.5*   PT/INR Recent Labs    12/01/2022 2253 12/09/22 0252  LABPROT 20.1* 19.4*  INR 1.7* 1.6*   ABG No results for input(s): "PHART", "HCO3" in the last 72 hours.  Invalid input(s): "PCO2", "PO2"  Studies/Results: DG CHEST PORT 1 VIEW  Result Date: 12/10/2022 CLINICAL DATA:  Hypoxia. EXAM: PORTABLE CHEST 1 VIEW COMPARISON:  Chest radiograph dated 12/27/2022. FINDINGS: The heart size is enlarged. The lung volumes are low. There is bibasilar atelectasis/airspace disease. No pneumothorax. Degenerative changes are seen in the spine. IMPRESSION: Low lung volumes with bibasilar atelectasis/airspace disease. Electronically Signed   By: Romona Curls M.D.   On: 12/10/2022 14:03   IR EMBO ART  VEN HEMORR LYMPH EXTRAV  INC GUIDE ROADMAPPING  Result Date: 12/09/2022 INDICATION: large pelvic mass with  hemoperitoneum.  Trauma with reported fall. EXAM: Procedures: 1. PELVIC ARTERIOGRAPHY 2. BILATERAL INTERNAL ILIAC ARTERY EMBOLIZATION MEDICATIONS: Ancef 2 gm IV. The antibiotic was administered within one hour of the procedure ANESTHESIA/SEDATION: Moderate (conscious) sedation was employed during this procedure. A total of Versed 1.5 mg and Fentanyl 75 mcg was administered intravenously. Moderate Sedation Time: 39 minutes. The patient's level of consciousness and vital signs were monitored continuously by radiology nursing throughout the procedure under my direct supervision. CONTRAST:  75mL OMNIPAQUE IOHEXOL 300 MG/ML  SOLN FLUOROSCOPY TIME:  Fluoroscopic dose; 579 mGy COMPLICATIONS: None immediate. PROCEDURE: Written informed consent was obtained after a discussion with the patient and/or patient's representative about the risks including, but not limited to, infection, bleeding, injury to the arteries and/or organs, and need for an additional procedure. A femoral approach was selected. The RIGHT femoral head was marked fluoroscopically, then the groin were prepped and draped using all elements of maximal sterile barrier technique. 1% lidocaine was used for local anesthesia. Limited ultrasound imaging of the RIGHT groin showed the femoral artery to be patent. An ultrasound image was saved and sent to PACS. Arterial access was obtained with a 21-G, 7-cm needle under direct ultrasound guidance, through which a 0.018-inch guidewire was advanced under fluoroscopy. The 21-G needle was then exchanged for a micropuncture catheter and a 0.035-inch Glidewire. The micropuncture catheter was exchanged for a 5 Fr sheath. A 5 Fr cobra 2 was used to catheterize  the LEFT and RIGHT internal iliac arteries through which digital subtraction angiograms (DSA) were obtained. The RIGHT internal iliac artery was cannulated following Waltman loop technique. PURPOSE OF THE ARTERIOGRAM: No previous catheter-directed angiogram was  available. Therefore a new complete diagnostic angiography was performed. The decision to proceed with an interventional procedure was made based on this new diagnostic angiogram. No discrete extravasation was appreciated. Empiric Gelfoam embolization was performed to near-stasis. Post embolization DSA was performed. At this point, all wires, catheters and sheaths were removed from the patient. ARTERIAL CLOSURE: Angiogram of the RIGHT common femoral artery was performed through the sheaths, which revealed favorable anatomy and sheath entry site for arterial closure device placement. Angio-Seal device were used for successful arterial closure. The puncture site was dressed in a sterile manner. The patient tolerated the procedure well without immediate post procedural complication. FINDINGS: *No active extravasation within the pelvis. *Large pelvic mass, with arterial supply distinct from the uterine arteries. *Empiric and therapeutic Gelfoam embolization of the tumoral and BILATERAL internal iliac arteries to near-stasis. IMPRESSION: 1. No active extravasation on angiography. 2. Large pelvic mass, with arterial supply appearing distinct from the uterine arteries. 3. Successful pelvic arteriography and therapeutic BILATERAL internal iliac artery Gelfoam embolization for a hemorrhagic mass, via transfemoral approach. PLAN: Post sheath removal precautions, including RIGHT lower extremity straight x2 hours. Roanna Banning, MD [facility] Radiology Electronically Signed   By: Roanna Banning M.D.   On: 12/09/2022 15:53   IR US Guide Vasc Access Right  Result Date: 12/09/2022 INDICATION: large pelvic mass with hemoperitoneum.  Trauma with reported fall. EXAM: Procedures: 1. PELVIC ARTERIOGRAPHY 2. BILATERAL INTERNAL ILIAC ARTERY EMBOLIZATION MEDICATIONS: Ancef 2 gm IV. The antibiotic was administered within one hour of the procedure ANESTHESIA/SEDATION: Moderate (conscious)  sedation was employed during this procedure. A total of Versed 1.5 mg and Fentanyl 75 mcg was administered intravenously. Moderate Sedation Time: 39 minutes. The patient's level of consciousness and vital signs were monitored continuously by radiology nursing throughout the procedure under my direct supervision. CONTRAST:  75mL OMNIPAQUE IOHEXOL 300 MG/ML  SOLN FLUOROSCOPY TIME:  Fluoroscopic dose; 579 mGy COMPLICATIONS: None immediate. PROCEDURE: Written informed consent was obtained after a discussion with the patient and/or patient's representative about the risks including, but not limited to, infection, bleeding, injury to the arteries and/or organs, and need for an additional procedure. A femoral approach was selected. The RIGHT femoral head was marked fluoroscopically, then the groin were prepped and draped using all elements of maximal sterile barrier technique. 1% lidocaine was used for local anesthesia. Limited ultrasound imaging of the RIGHT groin showed the femoral artery to be patent. An ultrasound image was saved and sent to PACS. Arterial access was obtained with a 21-G, 7-cm needle under direct ultrasound guidance, through which a 0.018-inch guidewire was advanced under fluoroscopy. The 21-G needle was then exchanged for a micropuncture catheter and a 0.035-inch Glidewire. The micropuncture catheter was exchanged for a 5 Fr sheath. A 5 Fr cobra 2 was used to catheterize the LEFT and RIGHT internal iliac arteries through which digital subtraction angiograms (DSA) were obtained. The RIGHT internal iliac artery was cannulated following Waltman loop technique. PURPOSE OF THE ARTERIOGRAM: No previous catheter-directed angiogram was available. Therefore a new complete diagnostic angiography was performed. The decision to proceed with an interventional procedure was made based on this new diagnostic angiogram. No discrete extravasation was appreciated. Empiric Gelfoam embolization was performed to  near-stasis. Post embolization DSA was performed. At this  point, all wires, catheters and sheaths were removed from the patient. ARTERIAL CLOSURE: Angiogram of the RIGHT common femoral artery was performed through the sheaths, which revealed favorable anatomy and sheath entry site for arterial closure device placement. Angio-Seal device were used for successful arterial closure. The puncture site was dressed in a sterile manner. The patient tolerated the procedure well without immediate post procedural complication. FINDINGS: *No active extravasation within the pelvis. *Large pelvic mass, with arterial supply distinct from the uterine arteries. *Empiric and therapeutic Gelfoam embolization of the tumoral and BILATERAL internal iliac arteries to near-stasis. IMPRESSION: 1. No active extravasation on angiography. 2. Large pelvic mass, with arterial supply appearing distinct from the uterine arteries. 3. Successful pelvic arteriography and therapeutic BILATERAL internal iliac artery Gelfoam embolization for a hemorrhagic mass, via transfemoral approach. PLAN: Post sheath removal precautions, including RIGHT lower extremity straight x2 hours. Roanna Banning, MD [facility] Radiology Electronically Signed   By: Roanna Banning M.D.   On: 12/09/2022 15:53   IR Angiogram Pelvis Selective Or Supraselective  Result Date: 12/09/2022 INDICATION: large pelvic mass with hemoperitoneum.  Trauma with reported fall. EXAM: Procedures: 1. PELVIC ARTERIOGRAPHY 2. BILATERAL INTERNAL ILIAC ARTERY EMBOLIZATION MEDICATIONS: Ancef 2 gm IV. The antibiotic was administered within one hour of the procedure ANESTHESIA/SEDATION: Moderate (conscious) sedation was employed during this procedure. A total of Versed 1.5 mg and Fentanyl 75 mcg was administered intravenously. Moderate Sedation Time: 39 minutes. The patient's level of consciousness and vital signs were monitored continuously by radiology  nursing throughout the procedure under my direct supervision. CONTRAST:  75mL OMNIPAQUE IOHEXOL 300 MG/ML  SOLN FLUOROSCOPY TIME:  Fluoroscopic dose; 579 mGy COMPLICATIONS: None immediate. PROCEDURE: Written informed consent was obtained after a discussion with the patient and/or patient's representative about the risks including, but not limited to, infection, bleeding, injury to the arteries and/or organs, and need for an additional procedure. A femoral approach was selected. The RIGHT femoral head was marked fluoroscopically, then the groin were prepped and draped using all elements of maximal sterile barrier technique. 1% lidocaine was used for local anesthesia. Limited ultrasound imaging of the RIGHT groin showed the femoral artery to be patent. An ultrasound image was saved and sent to PACS. Arterial access was obtained with a 21-G, 7-cm needle under direct ultrasound guidance, through which a 0.018-inch guidewire was advanced under fluoroscopy. The 21-G needle was then exchanged for a micropuncture catheter and a 0.035-inch Glidewire. The micropuncture catheter was exchanged for a 5 Fr sheath. A 5 Fr cobra 2 was used to catheterize the LEFT and RIGHT internal iliac arteries through which digital subtraction angiograms (DSA) were obtained. The RIGHT internal iliac artery was cannulated following Waltman loop technique. PURPOSE OF THE ARTERIOGRAM: No previous catheter-directed angiogram was available. Therefore a new complete diagnostic angiography was performed. The decision to proceed with an interventional procedure was made based on this new diagnostic angiogram. No discrete extravasation was appreciated. Empiric Gelfoam embolization was performed to near-stasis. Post embolization DSA was performed. At this point, all wires, catheters and sheaths were removed from the patient. ARTERIAL CLOSURE: Angiogram of the RIGHT common femoral artery was performed through the sheaths, which revealed favorable anatomy  and sheath entry site for arterial closure device placement. Angio-Seal device were used for successful arterial closure. The puncture site was dressed in a sterile manner. The patient tolerated the procedure well without immediate post procedural complication. FINDINGS: *No active extravasation within the pelvis. *Large pelvic mass, with arterial supply distinct  from the uterine arteries. *Empiric and therapeutic Gelfoam embolization of the tumoral and BILATERAL internal iliac arteries to near-stasis. IMPRESSION: 1. No active extravasation on angiography. 2. Large pelvic mass, with arterial supply appearing distinct from the uterine arteries. 3. Successful pelvic arteriography and therapeutic BILATERAL internal iliac artery Gelfoam embolization for a hemorrhagic mass, via transfemoral approach. PLAN: Post sheath removal precautions, including RIGHT lower extremity straight x2 hours. Roanna Banning, MD [facility] Radiology Electronically Signed   By: Roanna Banning M.D.   On: 12/09/2022 15:53   IR Angiogram Selective Each Additional Vessel  Result Date: 12/09/2022 INDICATION: large pelvic mass with hemoperitoneum.  Trauma with reported fall. EXAM: Procedures: 1. PELVIC ARTERIOGRAPHY 2. BILATERAL INTERNAL ILIAC ARTERY EMBOLIZATION MEDICATIONS: Ancef 2 gm IV. The antibiotic was administered within one hour of the procedure ANESTHESIA/SEDATION: Moderate (conscious) sedation was employed during this procedure. A total of Versed 1.5 mg and Fentanyl 75 mcg was administered intravenously. Moderate Sedation Time: 39 minutes. The patient's level of consciousness and vital signs were monitored continuously by radiology nursing throughout the procedure under my direct supervision. CONTRAST:  75mL OMNIPAQUE IOHEXOL 300 MG/ML  SOLN FLUOROSCOPY TIME:  Fluoroscopic dose; 579 mGy COMPLICATIONS: None immediate. PROCEDURE: Written informed consent was obtained after a discussion with  the patient and/or patient's representative about the risks including, but not limited to, infection, bleeding, injury to the arteries and/or organs, and need for an additional procedure. A femoral approach was selected. The RIGHT femoral head was marked fluoroscopically, then the groin were prepped and draped using all elements of maximal sterile barrier technique. 1% lidocaine was used for local anesthesia. Limited ultrasound imaging of the RIGHT groin showed the femoral artery to be patent. An ultrasound image was saved and sent to PACS. Arterial access was obtained with a 21-G, 7-cm needle under direct ultrasound guidance, through which a 0.018-inch guidewire was advanced under fluoroscopy. The 21-G needle was then exchanged for a micropuncture catheter and a 0.035-inch Glidewire. The micropuncture catheter was exchanged for a 5 Fr sheath. A 5 Fr cobra 2 was used to catheterize the LEFT and RIGHT internal iliac arteries through which digital subtraction angiograms (DSA) were obtained. The RIGHT internal iliac artery was cannulated following Waltman loop technique. PURPOSE OF THE ARTERIOGRAM: No previous catheter-directed angiogram was available. Therefore a new complete diagnostic angiography was performed. The decision to proceed with an interventional procedure was made based on this new diagnostic angiogram. No discrete extravasation was appreciated. Empiric Gelfoam embolization was performed to near-stasis. Post embolization DSA was performed. At this point, all wires, catheters and sheaths were removed from the patient. ARTERIAL CLOSURE: Angiogram of the RIGHT common femoral artery was performed through the sheaths, which revealed favorable anatomy and sheath entry site for arterial closure device placement. Angio-Seal device were used for successful arterial closure. The puncture site was dressed in a sterile manner. The patient tolerated the procedure well without immediate post procedural complication.  FINDINGS: *No active extravasation within the pelvis. *Large pelvic mass, with arterial supply distinct from the uterine arteries. *Empiric and therapeutic Gelfoam embolization of the tumoral and BILATERAL internal iliac arteries to near-stasis. IMPRESSION: 1. No active extravasation on angiography. 2. Large pelvic mass, with arterial supply appearing distinct from the uterine arteries. 3. Successful pelvic arteriography and therapeutic BILATERAL internal iliac artery Gelfoam embolization for a hemorrhagic mass, via transfemoral approach. PLAN: Post sheath removal precautions, including RIGHT lower extremity straight x2 hours. Roanna Banning, MD [facility] Radiology  Electronically Signed   By: Roanna Banning M.D.   On: 12/09/2022 15:53   IR Angiogram Selective Each Additional Vessel  Result Date: 12/09/2022 INDICATION: large pelvic mass with hemoperitoneum.  Trauma with reported fall. EXAM: Procedures: 1. PELVIC ARTERIOGRAPHY 2. BILATERAL INTERNAL ILIAC ARTERY EMBOLIZATION MEDICATIONS: Ancef 2 gm IV. The antibiotic was administered within one hour of the procedure ANESTHESIA/SEDATION: Moderate (conscious) sedation was employed during this procedure. A total of Versed 1.5 mg and Fentanyl 75 mcg was administered intravenously. Moderate Sedation Time: 39 minutes. The patient's level of consciousness and vital signs were monitored continuously by radiology nursing throughout the procedure under my direct supervision. CONTRAST:  75mL OMNIPAQUE IOHEXOL 300 MG/ML  SOLN FLUOROSCOPY TIME:  Fluoroscopic dose; 579 mGy COMPLICATIONS: None immediate. PROCEDURE: Written informed consent was obtained after a discussion with the patient and/or patient's representative about the risks including, but not limited to, infection, bleeding, injury to the arteries and/or organs, and need for an additional procedure. A femoral approach was selected. The RIGHT femoral head was marked  fluoroscopically, then the groin were prepped and draped using all elements of maximal sterile barrier technique. 1% lidocaine was used for local anesthesia. Limited ultrasound imaging of the RIGHT groin showed the femoral artery to be patent. An ultrasound image was saved and sent to PACS. Arterial access was obtained with a 21-G, 7-cm needle under direct ultrasound guidance, through which a 0.018-inch guidewire was advanced under fluoroscopy. The 21-G needle was then exchanged for a micropuncture catheter and a 0.035-inch Glidewire. The micropuncture catheter was exchanged for a 5 Fr sheath. A 5 Fr cobra 2 was used to catheterize the LEFT and RIGHT internal iliac arteries through which digital subtraction angiograms (DSA) were obtained. The RIGHT internal iliac artery was cannulated following Waltman loop technique. PURPOSE OF THE ARTERIOGRAM: No previous catheter-directed angiogram was available. Therefore a new complete diagnostic angiography was performed. The decision to proceed with an interventional procedure was made based on this new diagnostic angiogram. No discrete extravasation was appreciated. Empiric Gelfoam embolization was performed to near-stasis. Post embolization DSA was performed. At this point, all wires, catheters and sheaths were removed from the patient. ARTERIAL CLOSURE: Angiogram of the RIGHT common femoral artery was performed through the sheaths, which revealed favorable anatomy and sheath entry site for arterial closure device placement. Angio-Seal device were used for successful arterial closure. The puncture site was dressed in a sterile manner. The patient tolerated the procedure well without immediate post procedural complication. FINDINGS: *No active extravasation within the pelvis. *Large pelvic mass, with arterial supply distinct from the uterine arteries. *Empiric and therapeutic Gelfoam embolization of the tumoral and BILATERAL internal iliac arteries to near-stasis.  IMPRESSION: 1. No active extravasation on angiography. 2. Large pelvic mass, with arterial supply appearing distinct from the uterine arteries. 3. Successful pelvic arteriography and therapeutic BILATERAL internal iliac artery Gelfoam embolization for a hemorrhagic mass, via transfemoral approach. PLAN: Post sheath removal precautions, including RIGHT lower extremity straight x2 hours. Roanna Banning, MD [facility] Radiology Electronically Signed   By: Roanna Banning M.D.   On: 12/09/2022 15:53    Anti-infectives: Anti-infectives (From admission, onward)    Start     Dose/Rate Route Frequency Ordered Stop   12/10/22 1045  cefTRIAXone (ROCEPHIN) 2 g in sodium chloride 0.9 % 100 mL IVPB        2 g 200 mL/hr over 30 Minutes Intravenous Every 24 hours 12/10/22 0958     12/09/22 1259  ceFAZolin (  ANCEF) IVPB 2g/100 mL premix        over 30 Minutes Intravenous Continuous PRN 12/09/22 1301 12/09/22 1259   12/15/2022 2330  vancomycin (VANCOREADY) IVPB 2000 mg/400 mL        2,000 mg 200 mL/hr over 120 Minutes Intravenous  Once 12/15/22 2316 12/09/22 0407   Dec 15, 2022 2330  ceFEPIme (MAXIPIME) 2 g in sodium chloride 0.9 % 100 mL IVPB        2 g 200 mL/hr over 30 Minutes Intravenous  Once 12/15/2022 2316 12/09/22 0040   12/15/22 2315  metroNIDAZOLE (FLAGYL) IVPB 500 mg        500 mg 100 mL/hr over 60 Minutes Intravenous  Once 12/15/22 2310 12/09/22 0051       Assessment/Plan: 82 y/o F w/ a hx of Afib on Eliquis, CAD s/p stent, CKD, HTN, and hypothyroidism who presented after a FFS w/ hemoperitoneum and a left orbital blowout   -CBC pending, does not appear to be actively bleeding.  Transfuse as needed -will potentially need GYN/GYN-ONC opinion regarding pelvis mass -ok to get out of bed from a surgical standpoint -creatinine increasing, uop low  Moderately complex medical decision making  Abigail Miyamoto MD 12/11/2022

## 2022-12-11 NOTE — Progress Notes (Addendum)
NAME:  Tamara Larson, MRN:  [id number], DOB:  10-27-1940, LOS: 2 ADMISSION DATE:  Dec 10, 2022, CONSULTATION DATE:  12/11/2022  REFERRING MD:  Blinda Leatherwood, EDP , CHIEF COMPLAINT: Fall, hemoperitoneum  History of Present Illness:  82 year old woman with A-fib on Eliquis, BIBEMS from home after fall.  She had laser surgery on her eye for glaucoma 1 week ago.  She called her sister complaining of generalized weakness and by the time they got there, they had to force the door and found her on the floor.    In the ED, she was noted to have swelling and bruising around her left eye and abrasions on her kneeAnd reported head injury, hypotensive to the 80s, level 1 trauma was called. Labs showed hemoglobin of 6.7, lactate more than 15 She was given fluids and empiric cefepime, vancomycin and Flagyl Pan CT showed acute blowout fracture of the left orbit with fractures of the medial and inferior walls with herniation of fat, left periorbital hematoma, large pelvic mass measuring 12 x 12 x 15 cm, moderate to large hemoperitoneum more in the upper abdomen than in the pelvis.  Pertinent  Medical History  Hypertension Paroxysmal atrial fibrillation Hypothyroidism CAD with stent  Significant Hospital Events: Including procedures, antibiotic start and stop dates in addition to other pertinent events   10/11 found after ground-level fall with hypotension in the setting moderate to large hemoperitoneum.  Trauma and IR consulted, patient underwent embolization to internal iliac artery 10/12 remains pressor dependent with borderline hypotension and worsening AKI 10/13-still on pressors, still on amiodarone  Interim History / Subjective:  Elderly  mild short of breath Denies feeling pain or discomfort at present  Objective   Blood pressure (!) 95/54, pulse (!) 114, temperature 97.6 F (36.4 C), temperature source Oral, resp. rate 17, weight 84.5 kg, SpO2 93%.        Intake/Output Summary (Last 24 hours) at  12/11/2022 7253 Last data filed at 12/11/2022 0800 Gross per 24 hour  Intake 2886.09 ml  Output 115 ml  Net 2771.09 ml   Filed Weights   12-10-2022 2303  Weight: 84.5 kg    Examination: General: Elderly, does not appear to be in extremities, mild dyspnea HEENT: Moist oral mucosa Neuro: Easily arouses CV: S1-S2 appreciated PULM: Diminished breath sounds bilaterally GI: Soft, bowel sounds appreciated Extremities: warm/dry, no edema  Skin: no rashes or lesions  Resolved Hospital Problem list     Assessment & Plan:   Hemorrhagic shock with hemoperitoneum -Hemoperitoneum likely related to large pelvic mass-May be uterine/ovarian -Had embolization of the internal iliac artery per IR on 10/11 -Continue to monitor hemodynamics -Continue pressors -Continue Protonix drip -No anticoagulation  Leukocytosis Concern for septic shock -Continue empiric ceftriaxone -Trend fever, trend CBC  Acute hypoxemic respiratory failure -Likely multifactorial -Restrictive physiology from pneumoperitoneum -Continue oxygen supplementation -Aspiration precautions  Paroxysmal atrial fibrillation -Was anticoagulated at baseline with Eliquis -Received Kcentra on admission -Hold Eliquis -Continue amiodarone drip -Reinitiate home metoprolol at lower dose, was on 75 twice daily, will reinitiate at 25 twice daily  AKI on chronic kidney disease stage IV -Continue pressors to support renal perfusion -Avoid nephrotoxic medications -Cautious IV hydration -Worsening renal dysfunction however, patient has stated previously that she does not want dialysis -Added sodium bicarb tablets -Give 1 ampoule of sodium bicarb slow IV push  Left orbital wall fracture -Monitor  Hypertension -Hold home antihypertensives  Encourage oral intake  Discussed with family at bedside  Best Practice (right click and "Reselect all SmartList  Selections" daily)   Diet/type: Regular consistency (see orders) DVT  prophylaxis: SCD GI prophylaxis: N/A Lines: N/A Foley:  N/A Code Status:  full code  The patient is critically ill with multiple organ systems failure and requires high complexity decision making for assessment and support, frequent evaluation and titration of therapies, application of advanced monitoring technologies and extensive interpretation of multiple databases. Critical Care Time devoted to patient care services described in this note independent of APP/resident time (if applicable)  is 33 minutes.   Virl Diamond MD [facility] Personal pager: See Amion If unanswered, please page CCM On-call: #[phone]

## 2022-12-12 DIAGNOSIS — R578 Other shock: Secondary | ICD-10-CM | POA: Diagnosis not present

## 2022-12-12 LAB — CBC
HCT: 22.6 % — ABNORMAL LOW (ref 36.0–46.0)
Hemoglobin: 7.2 g/dL — ABNORMAL LOW (ref 12.0–15.0)
MCH: 30.1 pg (ref 26.0–34.0)
MCHC: 31.9 g/dL (ref 30.0–36.0)
MCV: 94.6 fL (ref 80.0–100.0)
Platelets: 112 10*3/uL — ABNORMAL LOW (ref 150–400)
RBC: 2.39 MIL/uL — ABNORMAL LOW (ref 3.87–5.11)
RDW: 17.9 % — ABNORMAL HIGH (ref 11.5–15.5)
WBC: 32.5 10*3/uL — ABNORMAL HIGH (ref 4.0–10.5)
nRBC: 1.5 % — ABNORMAL HIGH (ref 0.0–0.2)

## 2022-12-12 LAB — COMPREHENSIVE METABOLIC PANEL
ALT: 42 U/L (ref 0–44)
AST: 108 U/L — ABNORMAL HIGH (ref 15–41)
Albumin: 3.4 g/dL — ABNORMAL LOW (ref 3.5–5.0)
Alkaline Phosphatase: 60 U/L (ref 38–126)
Anion gap: 18 — ABNORMAL HIGH (ref 5–15)
BUN: 55 mg/dL — ABNORMAL HIGH (ref 8–23)
CO2: 16 mmol/L — ABNORMAL LOW (ref 22–32)
Calcium: 7.6 mg/dL — ABNORMAL LOW (ref 8.9–10.3)
Chloride: 105 mmol/L (ref 98–111)
Creatinine, Ser: 6.97 mg/dL — ABNORMAL HIGH (ref 0.44–1.00)
GFR, Estimated: 6 mL/min — ABNORMAL LOW (ref >60)
Glucose, Bld: 122 mg/dL — ABNORMAL HIGH (ref 70–99)
Potassium: 5.1 mmol/L (ref 3.5–5.1)
Sodium: 139 mmol/L (ref 135–145)
Total Bilirubin: 0.7 mg/dL (ref 0.3–1.2)
Total Protein: 5.7 g/dL — ABNORMAL LOW (ref 6.5–8.1)

## 2022-12-12 LAB — POCT I-STAT 7, (LYTES, BLD GAS, ICA,H+H)
Acid-base deficit: 13 mmol/L — ABNORMAL HIGH (ref 0.0–2.0)
Acid-base deficit: 13 mmol/L — ABNORMAL HIGH (ref 0.0–2.0)
Acid-base deficit: 11 mmol/L — ABNORMAL HIGH (ref 0.0–2.0)
Bicarbonate: 15.7 mmol/L — ABNORMAL LOW (ref 20.0–28.0)
Bicarbonate: 15.3 mmol/L — ABNORMAL LOW (ref 20.0–28.0)
Bicarbonate: 17.3 mmol/L — ABNORMAL LOW (ref 20.0–28.0)
Calcium, Ion: 1.06 mmol/L — ABNORMAL LOW (ref 1.15–1.40)
Calcium, Ion: 1.04 mmol/L — ABNORMAL LOW (ref 1.15–1.40)
Calcium, Ion: 1.04 mmol/L — ABNORMAL LOW (ref 1.15–1.40)
HCT: 24 % — ABNORMAL LOW (ref 36.0–46.0)
HCT: 24 % — ABNORMAL LOW (ref 36.0–46.0)
HCT: 27 % — ABNORMAL LOW (ref 36.0–46.0)
Hemoglobin: 8.2 g/dL — ABNORMAL LOW (ref 12.0–15.0)
Hemoglobin: 8.2 g/dL — ABNORMAL LOW (ref 12.0–15.0)
Hemoglobin: 9.2 g/dL — ABNORMAL LOW (ref 12.0–15.0)
O2 Saturation: 73 %
O2 Saturation: 93 %
O2 Saturation: 94 %
Potassium: 5 mmol/L (ref 3.5–5.1)
Potassium: 5.1 mmol/L (ref 3.5–5.1)
Potassium: 5.1 mmol/L (ref 3.5–5.1)
Sodium: 136 mmol/L (ref 135–145)
Sodium: 136 mmol/L (ref 135–145)
Sodium: 136 mmol/L (ref 135–145)
TCO2: 17 mmol/L — ABNORMAL LOW (ref 22–32)
TCO2: 17 mmol/L — ABNORMAL LOW (ref 22–32)
TCO2: 19 mmol/L — ABNORMAL LOW (ref 22–32)
pCO2 arterial: 50.4 mm[Hg] — ABNORMAL HIGH (ref 32–48)
pCO2 arterial: 46.4 mm[Hg] (ref 32–48)
pCO2 arterial: 48 mm[Hg] (ref 32–48)
pH, Arterial: 7.103 — CL (ref 7.35–7.45)
pH, Arterial: 7.126 — CL (ref 7.35–7.45)
pH, Arterial: 7.165 — CL (ref 7.35–7.45)
pO2, Arterial: 52 mm[Hg] — ABNORMAL LOW (ref 83–108)
pO2, Arterial: 88 mm[Hg] (ref 83–108)
pO2, Arterial: 92 mm[Hg] (ref 83–108)

## 2022-12-12 LAB — PREPARE RBC (CROSSMATCH)

## 2022-12-12 LAB — HEMOGLOBIN AND HEMATOCRIT, BLOOD
HCT: 27.6 % — ABNORMAL LOW (ref 36.0–46.0)
Hemoglobin: 8.9 g/dL — ABNORMAL LOW (ref 12.0–15.0)

## 2022-12-12 MED ORDER — SODIUM CHLORIDE 0.9 % IV SOLN: 2.0000 g | INTRAVENOUS | Status: DC

## 2022-12-12 MED ORDER — METHOCARBAMOL 500 MG PO TABS: 500.0000 mg | ORAL_TABLET | Freq: Three times a day (TID) | ORAL | Status: DC

## 2022-12-12 MED ORDER — SODIUM BICARBONATE 8.4 % IV SOLN
50.0000 meq | Freq: Once | INTRAVENOUS | Status: AC
  Administered 2022-12-12: 50 meq via INTRAVENOUS
  Filled 2022-12-12: qty 50

## 2022-12-12 MED ORDER — ORAL CARE MOUTH RINSE
15.0000 mL | OROMUCOSAL | Status: DC
  Administered 2022-12-12: 15 mL via OROMUCOSAL

## 2022-12-12 MED ORDER — MORPHINE BOLUS VIA INFUSION: 5.0000 mg | INTRAVENOUS | Status: DC | PRN

## 2022-12-12 MED ORDER — ACETAMINOPHEN 325 MG PO TABS: 650.0000 mg | ORAL_TABLET | Freq: Four times a day (QID) | ORAL | Status: DC | PRN

## 2022-12-12 MED ORDER — ORAL CARE MOUTH RINSE: 15.0000 mL | OROMUCOSAL | Status: DC | PRN

## 2022-12-12 MED ORDER — OXYCODONE HCL 5 MG PO TABS: 2.5000 mg | ORAL_TABLET | ORAL | Status: DC | PRN

## 2022-12-12 MED ORDER — GLYCOPYRROLATE 0.2 MG/ML IJ SOLN: 0.2000 mg | INTRAMUSCULAR | Status: DC | PRN

## 2022-12-12 MED ORDER — SODIUM BICARBONATE 8.4 % IV SOLN: 100.0000 meq | Freq: Once | INTRAVENOUS | Status: DC

## 2022-12-12 MED ORDER — SODIUM CHLORIDE 0.9% IV SOLUTION: Freq: Once | INTRAVENOUS | Status: AC

## 2022-12-12 MED ORDER — GLYCOPYRROLATE 1 MG PO TABS: 1.0000 mg | ORAL_TABLET | ORAL | Status: DC | PRN

## 2022-12-12 MED ORDER — MIDAZOLAM HCL 2 MG/2ML IJ SOLN: 2.0000 mg | INTRAMUSCULAR | Status: DC | PRN

## 2022-12-12 MED ORDER — POLYVINYL ALCOHOL 1.4 % OP SOLN: 1.0000 [drp] | Freq: Four times a day (QID) | OPHTHALMIC | Status: DC | PRN

## 2022-12-12 MED ORDER — ACETAMINOPHEN 650 MG RE SUPP: 650.0000 mg | Freq: Four times a day (QID) | RECTAL | Status: DC | PRN

## 2022-12-12 MED ORDER — MORPHINE 100MG IN NS 100ML (1MG/ML) PREMIX INFUSION
0.0000 mg/h | INTRAVENOUS | Status: DC
  Administered 2022-12-12: 5 mg/h via INTRAVENOUS

## 2022-12-13 LAB — TYPE AND SCREEN
ABO/RH(D): O POS
Antibody Screen: NEGATIVE
Unit division: 0

## 2022-12-13 LAB — BPAM RBC
Blood Product Expiration Date: 202411052359
ISSUE DATE / TIME: 202410140431
Unit Type and Rh: 5100

## 2022-12-14 LAB — CULTURE, BLOOD (ROUTINE X 2)
Culture: NO GROWTH
Culture: NO GROWTH
Special Requests: ADEQUATE

## 2022-12-19 ENCOUNTER — Inpatient Hospital Stay: Payer: Medicare HMO

## 2022-12-19 ENCOUNTER — Inpatient Hospital Stay: Payer: Medicare HMO | Admitting: Hematology and Oncology

## 2022-12-22 ENCOUNTER — Other Ambulatory Visit: Payer: Self-pay | Admitting: Cardiovascular Disease

## 2022-12-22 ENCOUNTER — Other Ambulatory Visit: Payer: Self-pay | Admitting: Nurse Practitioner

## 2022-12-30 NOTE — Care Management Important Message (Signed)
Important Message  Patient Details  Name: Tamara Larson MRN: [id number] Date of Birth: [birth year]   Important Message Given:  Yes - Medicare IM     Dorena Bodo 12/27/2022, 3:03 PM

## 2022-12-30 NOTE — Death Summary Note (Signed)
DEATH SUMMARY   Patient Details  Name: Tamara Larson MRN: [id number] DOB: [birth year]  Admission/Discharge Information   Admit Date:  2022/12/15  Date of Death: Date of Death: December 19, 2022  Time of Death: Time of Death: 1739/01/25  Length of Stay: 3  Referring Physician: Sharon Seller, NP   Reason(s) for Hospitalization  Hemorrhagic shock with hemoperitoneum Hemoperitoneum from large pelvic mass- uterine/ovarian Acute blood loss anemia Septic shock due to aspiration pneumonia  Acute hypoxemic/hypercapnic respiratory failure  Paroxysmal atrial fibrillation  AKI on chronic kidney disease stage IV Acute metabolic acidosis Hypocalcemia Left orbital wall fracture   Diagnoses  Preliminary cause of death: Withdrawal of care in the setting of multisystem organ failure Secondary Diagnoses (including complications and co-morbidities):  Principal Problem:   Hemorrhagic shock [facility])   Brief Hospital Course (including significant findings, care, treatment, and services provided and events leading to death)  Tamara Larson is a 82 y.o. year old female with A-fib on Eliquis, BIBEMS from home after fall.  She had laser surgery on her eye for glaucoma 1 week ago.  She called her sister complaining of generalized weakness and by the time they got there, they had to force the door and found her on the floor.     In the ED, she was noted to have swelling and bruising around her left eye and abrasions on her kneeAnd reported head injury, hypotensive to the 80s, level 1 trauma was called. Labs showed hemoglobin of 6.7, lactate more than 15 She was given fluids and empiric cefepime, vancomycin and Flagyl Pan CT showed acute blowout fracture of the left orbit with fractures of the medial and inferior walls with herniation of fat, left periorbital hematoma, large pelvic mass measuring 12 x 12 x 15 cm, moderate to large hemoperitoneum more in the upper abdomen than in the pelvis. Patient received multiple  blood transfusion, underwent internal iliac artery embolization by IR even with that she continue to drop her hemoglobin requiring blood transfusion.  Also patient aspirated, requiring vasopressor support from septic shock.  She was continued on antibiotic therapy, despite antibiotic therapy she continued to complain of shortness of breath requiring high flow nasal cannula then transition back to BiPAP.  Anticoagulation were kept on hold in the setting of hemorrhagic shock.  Patient serum creatinine continue to rise, nephrology was consulted, they recommend initiating hemodialysis but patient refused stating she would not want to be on dialysis.  She developed severe metabolic acidosis and electrolyte abnormalities.  As patient was refusing hemodialysis and she had large pelvic mass likely malignancy, goals of care discussions were carried with family, after long discussion patient's family stated they would not want to continue aggressive care and focus on comfort.  Patient was placed on comfort care, BiPAP was removed, she passed on 12/19/22 at 5:40 PM.  Patient's family was at bedside  Pertinent Labs and Studies  Significant Diagnostic Studies DG CHEST PORT 1 VIEW  Result Date: 12/10/2022 CLINICAL DATA:  Hypoxia. EXAM: PORTABLE CHEST 1 VIEW COMPARISON:  Chest radiograph dated 12/15/22. FINDINGS: The heart size is enlarged. The lung volumes are low. There is bibasilar atelectasis/airspace disease. No pneumothorax. Degenerative changes are seen in the spine. IMPRESSION: Low lung volumes with bibasilar atelectasis/airspace disease. Electronically Signed   By: Romona Curls M.D.   On: 12/10/2022 14:03   IR EMBO ART  VEN HEMORR LYMPH EXTRAV  INC GUIDE ROADMAPPING  Result Date: 12/09/2022 INDICATION: large pelvic mass with hemoperitoneum.  Trauma with reported fall. EXAM:  Procedures: 1. PELVIC ARTERIOGRAPHY 2. BILATERAL INTERNAL ILIAC ARTERY EMBOLIZATION MEDICATIONS: Ancef 2 gm IV. The antibiotic was  administered within one hour of the procedure ANESTHESIA/SEDATION: Moderate (conscious) sedation was employed during this procedure. A total of Versed 1.5 mg and Fentanyl 75 mcg was administered intravenously. Moderate Sedation Time: 39 minutes. The patient's level of consciousness and vital signs were monitored continuously by radiology nursing throughout the procedure under my direct supervision. CONTRAST:  75mL OMNIPAQUE IOHEXOL 300 MG/ML  SOLN FLUOROSCOPY TIME:  Fluoroscopic dose; 579 mGy COMPLICATIONS: None immediate. PROCEDURE: Written informed consent was obtained after a discussion with the patient and/or patient's representative about the risks including, but not limited to, infection, bleeding, injury to the arteries and/or organs, and need for an additional procedure. A femoral approach was selected. The RIGHT femoral head was marked fluoroscopically, then the groin were prepped and draped using all elements of maximal sterile barrier technique. 1% lidocaine was used for local anesthesia. Limited ultrasound imaging of the RIGHT groin showed the femoral artery to be patent. An ultrasound image was saved and sent to PACS. Arterial access was obtained with a 21-G, 7-cm needle under direct ultrasound guidance, through which a 0.018-inch guidewire was advanced under fluoroscopy. The 21-G needle was then exchanged for a micropuncture catheter and a 0.035-inch Glidewire. The micropuncture catheter was exchanged for a 5 Fr sheath. A 5 Fr cobra 2 was used to catheterize the LEFT and RIGHT internal iliac arteries through which digital subtraction angiograms (DSA) were obtained. The RIGHT internal iliac artery was cannulated following Waltman loop technique. PURPOSE OF THE ARTERIOGRAM: No previous catheter-directed angiogram was available. Therefore a new complete diagnostic angiography was performed. The decision to proceed with an interventional procedure was made based on this new diagnostic angiogram. No  discrete extravasation was appreciated. Empiric Gelfoam embolization was performed to near-stasis. Post embolization DSA was performed. At this point, all wires, catheters and sheaths were removed from the patient. ARTERIAL CLOSURE: Angiogram of the RIGHT common femoral artery was performed through the sheaths, which revealed favorable anatomy and sheath entry site for arterial closure device placement. Angio-Seal device were used for successful arterial closure. The puncture site was dressed in a sterile manner. The patient tolerated the procedure well without immediate post procedural complication. FINDINGS: *No active extravasation within the pelvis. *Large pelvic mass, with arterial supply distinct from the uterine arteries. *Empiric and therapeutic Gelfoam embolization of the tumoral and BILATERAL internal iliac arteries to near-stasis. IMPRESSION: 1. No active extravasation on angiography. 2. Large pelvic mass, with arterial supply appearing distinct from the uterine arteries. 3. Successful pelvic arteriography and therapeutic BILATERAL internal iliac artery Gelfoam embolization for a hemorrhagic mass, via transfemoral approach. PLAN: Post sheath removal precautions, including RIGHT lower extremity straight x2 hours. Roanna Banning, MD [facility] Radiology Electronically Signed   By: Roanna Banning M.D.   On: 12/09/2022 15:53   IR US Guide Vasc Access Right  Result Date: 12/09/2022 INDICATION: large pelvic mass with hemoperitoneum.  Trauma with reported fall. EXAM: Procedures: 1. PELVIC ARTERIOGRAPHY 2. BILATERAL INTERNAL ILIAC ARTERY EMBOLIZATION MEDICATIONS: Ancef 2 gm IV. The antibiotic was administered within one hour of the procedure ANESTHESIA/SEDATION: Moderate (conscious) sedation was employed during this procedure. A total of Versed 1.5 mg and Fentanyl 75 mcg was administered intravenously. Moderate Sedation Time: 39 minutes. The patient's level of  consciousness and vital signs were monitored continuously by radiology nursing throughout the procedure under my direct supervision. CONTRAST:  75mL OMNIPAQUE IOHEXOL 300  MG/ML  SOLN FLUOROSCOPY TIME:  Fluoroscopic dose; 579 mGy COMPLICATIONS: None immediate. PROCEDURE: Written informed consent was obtained after a discussion with the patient and/or patient's representative about the risks including, but not limited to, infection, bleeding, injury to the arteries and/or organs, and need for an additional procedure. A femoral approach was selected. The RIGHT femoral head was marked fluoroscopically, then the groin were prepped and draped using all elements of maximal sterile barrier technique. 1% lidocaine was used for local anesthesia. Limited ultrasound imaging of the RIGHT groin showed the femoral artery to be patent. An ultrasound image was saved and sent to PACS. Arterial access was obtained with a 21-G, 7-cm needle under direct ultrasound guidance, through which a 0.018-inch guidewire was advanced under fluoroscopy. The 21-G needle was then exchanged for a micropuncture catheter and a 0.035-inch Glidewire. The micropuncture catheter was exchanged for a 5 Fr sheath. A 5 Fr cobra 2 was used to catheterize the LEFT and RIGHT internal iliac arteries through which digital subtraction angiograms (DSA) were obtained. The RIGHT internal iliac artery was cannulated following Waltman loop technique. PURPOSE OF THE ARTERIOGRAM: No previous catheter-directed angiogram was available. Therefore a new complete diagnostic angiography was performed. The decision to proceed with an interventional procedure was made based on this new diagnostic angiogram. No discrete extravasation was appreciated. Empiric Gelfoam embolization was performed to near-stasis. Post embolization DSA was performed. At this point, all wires, catheters and sheaths were removed from the patient. ARTERIAL CLOSURE: Angiogram of the RIGHT common femoral  artery was performed through the sheaths, which revealed favorable anatomy and sheath entry site for arterial closure device placement. Angio-Seal device were used for successful arterial closure. The puncture site was dressed in a sterile manner. The patient tolerated the procedure well without immediate post procedural complication. FINDINGS: *No active extravasation within the pelvis. *Large pelvic mass, with arterial supply distinct from the uterine arteries. *Empiric and therapeutic Gelfoam embolization of the tumoral and BILATERAL internal iliac arteries to near-stasis. IMPRESSION: 1. No active extravasation on angiography. 2. Large pelvic mass, with arterial supply appearing distinct from the uterine arteries. 3. Successful pelvic arteriography and therapeutic BILATERAL internal iliac artery Gelfoam embolization for a hemorrhagic mass, via transfemoral approach. PLAN: Post sheath removal precautions, including RIGHT lower extremity straight x2 hours. Roanna Banning, MD [facility] Radiology Electronically Signed   By: Roanna Banning M.D.   On: 12/09/2022 15:53   IR Angiogram Pelvis Selective Or Supraselective  Result Date: 12/09/2022 INDICATION: large pelvic mass with hemoperitoneum.  Trauma with reported fall. EXAM: Procedures: 1. PELVIC ARTERIOGRAPHY 2. BILATERAL INTERNAL ILIAC ARTERY EMBOLIZATION MEDICATIONS: Ancef 2 gm IV. The antibiotic was administered within one hour of the procedure ANESTHESIA/SEDATION: Moderate (conscious) sedation was employed during this procedure. A total of Versed 1.5 mg and Fentanyl 75 mcg was administered intravenously. Moderate Sedation Time: 39 minutes. The patient's level of consciousness and vital signs were monitored continuously by radiology nursing throughout the procedure under my direct supervision. CONTRAST:  75mL OMNIPAQUE IOHEXOL 300 MG/ML  SOLN FLUOROSCOPY TIME:  Fluoroscopic dose; 579 mGy COMPLICATIONS: None  immediate. PROCEDURE: Written informed consent was obtained after a discussion with the patient and/or patient's representative about the risks including, but not limited to, infection, bleeding, injury to the arteries and/or organs, and need for an additional procedure. A femoral approach was selected. The RIGHT femoral head was marked fluoroscopically, then the groin were prepped and draped using all elements of maximal sterile barrier technique. 1% lidocaine was  used for local anesthesia. Limited ultrasound imaging of the RIGHT groin showed the femoral artery to be patent. An ultrasound image was saved and sent to PACS. Arterial access was obtained with a 21-G, 7-cm needle under direct ultrasound guidance, through which a 0.018-inch guidewire was advanced under fluoroscopy. The 21-G needle was then exchanged for a micropuncture catheter and a 0.035-inch Glidewire. The micropuncture catheter was exchanged for a 5 Fr sheath. A 5 Fr cobra 2 was used to catheterize the LEFT and RIGHT internal iliac arteries through which digital subtraction angiograms (DSA) were obtained. The RIGHT internal iliac artery was cannulated following Waltman loop technique. PURPOSE OF THE ARTERIOGRAM: No previous catheter-directed angiogram was available. Therefore a new complete diagnostic angiography was performed. The decision to proceed with an interventional procedure was made based on this new diagnostic angiogram. No discrete extravasation was appreciated. Empiric Gelfoam embolization was performed to near-stasis. Post embolization DSA was performed. At this point, all wires, catheters and sheaths were removed from the patient. ARTERIAL CLOSURE: Angiogram of the RIGHT common femoral artery was performed through the sheaths, which revealed favorable anatomy and sheath entry site for arterial closure device placement. Angio-Seal device were used for successful arterial closure. The puncture site was dressed in a sterile manner. The  patient tolerated the procedure well without immediate post procedural complication. FINDINGS: *No active extravasation within the pelvis. *Large pelvic mass, with arterial supply distinct from the uterine arteries. *Empiric and therapeutic Gelfoam embolization of the tumoral and BILATERAL internal iliac arteries to near-stasis. IMPRESSION: 1. No active extravasation on angiography. 2. Large pelvic mass, with arterial supply appearing distinct from the uterine arteries. 3. Successful pelvic arteriography and therapeutic BILATERAL internal iliac artery Gelfoam embolization for a hemorrhagic mass, via transfemoral approach. PLAN: Post sheath removal precautions, including RIGHT lower extremity straight x2 hours. Roanna Banning, MD [facility] Radiology Electronically Signed   By: Roanna Banning M.D.   On: 12/09/2022 15:53   IR Angiogram Selective Each Additional Vessel  Result Date: 12/09/2022 INDICATION: large pelvic mass with hemoperitoneum.  Trauma with reported fall. EXAM: Procedures: 1. PELVIC ARTERIOGRAPHY 2. BILATERAL INTERNAL ILIAC ARTERY EMBOLIZATION MEDICATIONS: Ancef 2 gm IV. The antibiotic was administered within one hour of the procedure ANESTHESIA/SEDATION: Moderate (conscious) sedation was employed during this procedure. A total of Versed 1.5 mg and Fentanyl 75 mcg was administered intravenously. Moderate Sedation Time: 39 minutes. The patient's level of consciousness and vital signs were monitored continuously by radiology nursing throughout the procedure under my direct supervision. CONTRAST:  75mL OMNIPAQUE IOHEXOL 300 MG/ML  SOLN FLUOROSCOPY TIME:  Fluoroscopic dose; 579 mGy COMPLICATIONS: None immediate. PROCEDURE: Written informed consent was obtained after a discussion with the patient and/or patient's representative about the risks including, but not limited to, infection, bleeding, injury to the arteries and/or organs, and need for an additional  procedure. A femoral approach was selected. The RIGHT femoral head was marked fluoroscopically, then the groin were prepped and draped using all elements of maximal sterile barrier technique. 1% lidocaine was used for local anesthesia. Limited ultrasound imaging of the RIGHT groin showed the femoral artery to be patent. An ultrasound image was saved and sent to PACS. Arterial access was obtained with a 21-G, 7-cm needle under direct ultrasound guidance, through which a 0.018-inch guidewire was advanced under fluoroscopy. The 21-G needle was then exchanged for a micropuncture catheter and a 0.035-inch Glidewire. The micropuncture catheter was exchanged for a 5 Fr sheath. A 5 Fr cobra 2 was used  to catheterize the LEFT and RIGHT internal iliac arteries through which digital subtraction angiograms (DSA) were obtained. The RIGHT internal iliac artery was cannulated following Waltman loop technique. PURPOSE OF THE ARTERIOGRAM: No previous catheter-directed angiogram was available. Therefore a new complete diagnostic angiography was performed. The decision to proceed with an interventional procedure was made based on this new diagnostic angiogram. No discrete extravasation was appreciated. Empiric Gelfoam embolization was performed to near-stasis. Post embolization DSA was performed. At this point, all wires, catheters and sheaths were removed from the patient. ARTERIAL CLOSURE: Angiogram of the RIGHT common femoral artery was performed through the sheaths, which revealed favorable anatomy and sheath entry site for arterial closure device placement. Angio-Seal device were used for successful arterial closure. The puncture site was dressed in a sterile manner. The patient tolerated the procedure well without immediate post procedural complication. FINDINGS: *No active extravasation within the pelvis. *Large pelvic mass, with arterial supply distinct from the uterine arteries. *Empiric and therapeutic Gelfoam embolization  of the tumoral and BILATERAL internal iliac arteries to near-stasis. IMPRESSION: 1. No active extravasation on angiography. 2. Large pelvic mass, with arterial supply appearing distinct from the uterine arteries. 3. Successful pelvic arteriography and therapeutic BILATERAL internal iliac artery Gelfoam embolization for a hemorrhagic mass, via transfemoral approach. PLAN: Post sheath removal precautions, including RIGHT lower extremity straight x2 hours. Roanna Banning, MD [facility] Radiology Electronically Signed   By: Roanna Banning M.D.   On: 12/09/2022 15:53   IR Angiogram Selective Each Additional Vessel  Result Date: 12/09/2022 INDICATION: large pelvic mass with hemoperitoneum.  Trauma with reported fall. EXAM: Procedures: 1. PELVIC ARTERIOGRAPHY 2. BILATERAL INTERNAL ILIAC ARTERY EMBOLIZATION MEDICATIONS: Ancef 2 gm IV. The antibiotic was administered within one hour of the procedure ANESTHESIA/SEDATION: Moderate (conscious) sedation was employed during this procedure. A total of Versed 1.5 mg and Fentanyl 75 mcg was administered intravenously. Moderate Sedation Time: 39 minutes. The patient's level of consciousness and vital signs were monitored continuously by radiology nursing throughout the procedure under my direct supervision. CONTRAST:  75mL OMNIPAQUE IOHEXOL 300 MG/ML  SOLN FLUOROSCOPY TIME:  Fluoroscopic dose; 579 mGy COMPLICATIONS: None immediate. PROCEDURE: Written informed consent was obtained after a discussion with the patient and/or patient's representative about the risks including, but not limited to, infection, bleeding, injury to the arteries and/or organs, and need for an additional procedure. A femoral approach was selected. The RIGHT femoral head was marked fluoroscopically, then the groin were prepped and draped using all elements of maximal sterile barrier technique. 1% lidocaine was used for local anesthesia. Limited ultrasound imaging  of the RIGHT groin showed the femoral artery to be patent. An ultrasound image was saved and sent to PACS. Arterial access was obtained with a 21-G, 7-cm needle under direct ultrasound guidance, through which a 0.018-inch guidewire was advanced under fluoroscopy. The 21-G needle was then exchanged for a micropuncture catheter and a 0.035-inch Glidewire. The micropuncture catheter was exchanged for a 5 Fr sheath. A 5 Fr cobra 2 was used to catheterize the LEFT and RIGHT internal iliac arteries through which digital subtraction angiograms (DSA) were obtained. The RIGHT internal iliac artery was cannulated following Waltman loop technique. PURPOSE OF THE ARTERIOGRAM: No previous catheter-directed angiogram was available. Therefore a new complete diagnostic angiography was performed. The decision to proceed with an interventional procedure was made based on this new diagnostic angiogram. No discrete extravasation was appreciated. Empiric Gelfoam embolization was performed to near-stasis. Post embolization DSA was performed. At  this point, all wires, catheters and sheaths were removed from the patient. ARTERIAL CLOSURE: Angiogram of the RIGHT common femoral artery was performed through the sheaths, which revealed favorable anatomy and sheath entry site for arterial closure device placement. Angio-Seal device were used for successful arterial closure. The puncture site was dressed in a sterile manner. The patient tolerated the procedure well without immediate post procedural complication. FINDINGS: *No active extravasation within the pelvis. *Large pelvic mass, with arterial supply distinct from the uterine arteries. *Empiric and therapeutic Gelfoam embolization of the tumoral and BILATERAL internal iliac arteries to near-stasis. IMPRESSION: 1. No active extravasation on angiography. 2. Large pelvic mass, with arterial supply appearing distinct from the uterine arteries. 3. Successful pelvic arteriography and therapeutic  BILATERAL internal iliac artery Gelfoam embolization for a hemorrhagic mass, via transfemoral approach. PLAN: Post sheath removal precautions, including RIGHT lower extremity straight x2 hours. Roanna Banning, MD [facility] Radiology Electronically Signed   By: Roanna Banning M.D.   On: 12/09/2022 15:53   DG Knee Left Port  Result Date: 12/09/2022 CLINICAL DATA:  82 year old female with history of trauma from a fall complaining of left knee pain. EXAM: PORTABLE LEFT KNEE - 1-2 VIEW COMPARISON:  No priors. FINDINGS: Four views of the left knee demonstrate no acute displaced fracture, subluxation or dislocation. Prepatellar soft tissue swelling is noted. Joint space narrowing, subchondral sclerosis, subchondral cyst formation and osteophyte formation is noted in a tricompartmental distribution, most severe in the medial and patellofemoral compartments. Numerous vascular calcifications are noted. IMPRESSION: 1. Prepatellar soft tissue swelling.  No underlying fracture. 2. Tricompartmental osteoarthritis, most severe in the medial and patellofemoral compartments. Electronically Signed   By: Trudie Reed M.D.   On: 12/09/2022 05:48   CT HEAD WO CONTRAST  Result Date: 12/09/2022 CLINICAL DATA:  Ground level fall on blood thinners EXAM: CT HEAD WITHOUT CONTRAST CT MAXILLOFACIAL WITHOUT CONTRAST CT CERVICAL SPINE WITHOUT CONTRAST CT CHEST, ABDOMEN AND PELVIS WITH CONTRAST TECHNIQUE: Contiguous axial images were obtained from the base of the skull through the vertex without intravenous contrast. Multidetector CT imaging of the maxillofacial structures was performed. Multiplanar CT image reconstructions were also generated. A small metallic BB was placed on the right temple in order to reliably differentiate right from left. Multidetector CT imaging of the cervical spine was performed without intravenous contrast. Multiplanar CT image reconstructions were also  generated. Multidetector CT imaging of the chest, abdomen and pelvis was performed following the standard protocol during bolus administration of intravenous contrast. RADIATION DOSE REDUCTION: This exam was performed according to the departmental dose-optimization program which includes automated exposure control, adjustment of the mA and/or kV according to patient size and/or use of iterative reconstruction technique. CONTRAST:  75mL OMNIPAQUE IOHEXOL 350 MG/ML SOLN COMPARISON:  Renal ultrasound 11/17/2022; CT head and cervical spine 11/18/2018; CT 12/22/2012 FINDINGS: CT HEAD FINDINGS Brain: No evidence of acute infarction, hemorrhage, hydrocephalus, extra-axial collection or mass lesion/mass effect. Vascular: No hyperdense vessel or unexpected calcification. Skull: Normal. Negative for fracture or focal lesion. Other: None. CT MAXILLOFACIAL FINDINGS Osseous: Fracture of the left orbit with fractures of the medial and inferior walls. Herniation of fat into the left maxillary sinus through the inferior floor defect. Orbits: The globes are intact.  No postseptal hematoma. Sinuses: About products in the left maxillary sinus. The sinuses are otherwise well aerated. No mastoid effusion. Soft tissues: Left forehead/periorbital hematoma. CT CERVICAL SPINE FINDINGS Alignment: No evidence of traumatic malalignment. Skull base and vertebrae: No acute  fracture. Soft tissues and spinal canal: Thickening of the prevertebral soft tissues due to medialized carotid arteries posterior to the esophagus. No prevertebral swelling or hematoma. Disc levels: Age-related spondylosis greatest at C4-C5 and C5-C6 where it is moderate. Posterior disc osteophyte complex at C4-C5 and C5-C6 cause mild effacement the ventral thecal sac. No severe spinal canal narrowing. Other: Carotid calcification. CT CHEST FINDINGS Cardiovascular: No pericardial effusion. No evidence of aortic injury. Coronary artery and aortic atherosclerotic calcification.  Mediastinum/Nodes: Trachea and esophagus are unremarkable. No mediastinal hematoma. Lungs/Pleura: Scattered scarring/atelectasis. No focal consolidation, pleural effusion, or pneumothorax. Musculoskeletal: No acute fracture.  Remote fracture of the sternum. CT ABDOMEN PELVIS FINDINGS Hepatobiliary: No hepatic laceration or hematoma. Hepatic cysts. Unremarkable gallbladder and biliary tree. Pancreas: Unremarkable. Spleen: No splenic laceration or hematoma. Adrenals/Urinary Tract: No adrenal hemorrhage. No renal laceration or hematoma. Low-attenuation lesions in the kidneys are statistically likely to represent cysts. No follow-up is required. Unremarkable bladder. Stomach/Bowel: Normal caliber large and small bowel. Mild wall thickening of a loop of small bowel in the central mesentery likely reactive secondary to adjacent blood products. Decompressed stomach. There is some loss definition of the lateral wall of the gastric fundus (circa series 6/image 96). This is favored to represent silhouetting due to adjacent hemorrhage rather than perforation as there is no free intraperitoneal air. Colonic diverticulosis without diverticulitis. Vascular/Lymphatic: Aortic atherosclerosis. No enlarged abdominal or pelvic lymph nodes. Reproductive: Large heterogenous pelvic mass measures 11.9 x 12.4 x 14.9 cm. This exerts mass effect on the uterus on lateral view. No discrete ovaries are seen. Other: Moderate-to-large hemoperitoneum greatest about the upper abdomen. The source of the hemorrhage is presumably the pelvic mass. No free intraperitoneal air. Musculoskeletal: No acute fracture. CT LUMBAR SPINE Segmentation: 5 lumbar type vertebrae. Alignment: No evidence of traumatic malalignment. Vertebrae: No acute fracture. Paraspinal and other soft tissues: See above. Disc levels: Decompressive laminectomy at L3-L4 and L4-L5. Multilevel advanced spondylosis and facet arthropathy. IMPRESSION: 1. No acute intracranial abnormality. 2.  Acute blow out fracture of the left orbit with fractures of the medial and inferior walls. Herniation of fat through the inferior wall. Correlate for entrapment. 3. Left forehead/periorbital hematoma. 4. No acute fracture in the cervical spine. 5. No acute traumatic injury in the chest. 6. Large heterogenous pelvic mass measuring 11.9 x 12.4 x 14.9 cm likely adnexal or possibly uterine in origin. 7. Moderate-to-large hemoperitoneum greatest about the upper abdomen. The source of the hemorrhage is presumably the pelvic mass. 8. No acute fracture in the lumbar spine. Aortic Atherosclerosis (ICD10-I70.0). Critical Value/emergent results were called by telephone at the time of interpretation on 01/01/23 at 11:51 pm to provider Dr Andrey Campanile, who verbally acknowledged these results. Electronically Signed   By: Minerva Fester M.D.   On: 12/09/2022 00:26   CT MAXILLOFACIAL WO CONTRAST  Result Date: 12/09/2022 CLINICAL DATA:  Ground level fall on blood thinners EXAM: CT HEAD WITHOUT CONTRAST CT MAXILLOFACIAL WITHOUT CONTRAST CT CERVICAL SPINE WITHOUT CONTRAST CT CHEST, ABDOMEN AND PELVIS WITH CONTRAST TECHNIQUE: Contiguous axial images were obtained from the base of the skull through the vertex without intravenous contrast. Multidetector CT imaging of the maxillofacial structures was performed. Multiplanar CT image reconstructions were also generated. A small metallic BB was placed on the right temple in order to reliably differentiate right from left. Multidetector CT imaging of the cervical spine was performed without intravenous contrast. Multiplanar CT image reconstructions were also generated. Multidetector CT imaging of the chest, abdomen and pelvis was performed following the  standard protocol during bolus administration of intravenous contrast. RADIATION DOSE REDUCTION: This exam was performed according to the departmental dose-optimization program which includes automated exposure control, adjustment of the mA  and/or kV according to patient size and/or use of iterative reconstruction technique. CONTRAST:  75mL OMNIPAQUE IOHEXOL 350 MG/ML SOLN COMPARISON:  Renal ultrasound 11/17/2022; CT head and cervical spine 11/18/2018; CT 12/22/2012 FINDINGS: CT HEAD FINDINGS Brain: No evidence of acute infarction, hemorrhage, hydrocephalus, extra-axial collection or mass lesion/mass effect. Vascular: No hyperdense vessel or unexpected calcification. Skull: Normal. Negative for fracture or focal lesion. Other: None. CT MAXILLOFACIAL FINDINGS Osseous: Fracture of the left orbit with fractures of the medial and inferior walls. Herniation of fat into the left maxillary sinus through the inferior floor defect. Orbits: The globes are intact.  No postseptal hematoma. Sinuses: About products in the left maxillary sinus. The sinuses are otherwise well aerated. No mastoid effusion. Soft tissues: Left forehead/periorbital hematoma. CT CERVICAL SPINE FINDINGS Alignment: No evidence of traumatic malalignment. Skull base and vertebrae: No acute fracture. Soft tissues and spinal canal: Thickening of the prevertebral soft tissues due to medialized carotid arteries posterior to the esophagus. No prevertebral swelling or hematoma. Disc levels: Age-related spondylosis greatest at C4-C5 and C5-C6 where it is moderate. Posterior disc osteophyte complex at C4-C5 and C5-C6 cause mild effacement the ventral thecal sac. No severe spinal canal narrowing. Other: Carotid calcification. CT CHEST FINDINGS Cardiovascular: No pericardial effusion. No evidence of aortic injury. Coronary artery and aortic atherosclerotic calcification. Mediastinum/Nodes: Trachea and esophagus are unremarkable. No mediastinal hematoma. Lungs/Pleura: Scattered scarring/atelectasis. No focal consolidation, pleural effusion, or pneumothorax. Musculoskeletal: No acute fracture.  Remote fracture of the sternum. CT ABDOMEN PELVIS FINDINGS Hepatobiliary: No hepatic laceration or hematoma.  Hepatic cysts. Unremarkable gallbladder and biliary tree. Pancreas: Unremarkable. Spleen: No splenic laceration or hematoma. Adrenals/Urinary Tract: No adrenal hemorrhage. No renal laceration or hematoma. Low-attenuation lesions in the kidneys are statistically likely to represent cysts. No follow-up is required. Unremarkable bladder. Stomach/Bowel: Normal caliber large and small bowel. Mild wall thickening of a loop of small bowel in the central mesentery likely reactive secondary to adjacent blood products. Decompressed stomach. There is some loss definition of the lateral wall of the gastric fundus (circa series 6/image 96). This is favored to represent silhouetting due to adjacent hemorrhage rather than perforation as there is no free intraperitoneal air. Colonic diverticulosis without diverticulitis. Vascular/Lymphatic: Aortic atherosclerosis. No enlarged abdominal or pelvic lymph nodes. Reproductive: Large heterogenous pelvic mass measures 11.9 x 12.4 x 14.9 cm. This exerts mass effect on the uterus on lateral view. No discrete ovaries are seen. Other: Moderate-to-large hemoperitoneum greatest about the upper abdomen. The source of the hemorrhage is presumably the pelvic mass. No free intraperitoneal air. Musculoskeletal: No acute fracture. CT LUMBAR SPINE Segmentation: 5 lumbar type vertebrae. Alignment: No evidence of traumatic malalignment. Vertebrae: No acute fracture. Paraspinal and other soft tissues: See above. Disc levels: Decompressive laminectomy at L3-L4 and L4-L5. Multilevel advanced spondylosis and facet arthropathy. IMPRESSION: 1. No acute intracranial abnormality. 2. Acute blow out fracture of the left orbit with fractures of the medial and inferior walls. Herniation of fat through the inferior wall. Correlate for entrapment. 3. Left forehead/periorbital hematoma. 4. No acute fracture in the cervical spine. 5. No acute traumatic injury in the chest. 6. Large heterogenous pelvic mass measuring  11.9 x 12.4 x 14.9 cm likely adnexal or possibly uterine in origin. 7. Moderate-to-large hemoperitoneum greatest about the upper abdomen. The source of the hemorrhage is presumably the pelvic  mass. 8. No acute fracture in the lumbar spine. Aortic Atherosclerosis (ICD10-I70.0). Critical Value/emergent results were called by telephone at the time of interpretation on 12/15/2022 at 11:51 pm to provider Dr Andrey Campanile, who verbally acknowledged these results. Electronically Signed   By: Minerva Fester M.D.   On: 12/09/2022 00:26   CT CERVICAL SPINE WO CONTRAST  Result Date: 12/09/2022 CLINICAL DATA:  Ground level fall on blood thinners EXAM: CT HEAD WITHOUT CONTRAST CT MAXILLOFACIAL WITHOUT CONTRAST CT CERVICAL SPINE WITHOUT CONTRAST CT CHEST, ABDOMEN AND PELVIS WITH CONTRAST TECHNIQUE: Contiguous axial images were obtained from the base of the skull through the vertex without intravenous contrast. Multidetector CT imaging of the maxillofacial structures was performed. Multiplanar CT image reconstructions were also generated. A small metallic BB was placed on the right temple in order to reliably differentiate right from left. Multidetector CT imaging of the cervical spine was performed without intravenous contrast. Multiplanar CT image reconstructions were also generated. Multidetector CT imaging of the chest, abdomen and pelvis was performed following the standard protocol during bolus administration of intravenous contrast. RADIATION DOSE REDUCTION: This exam was performed according to the departmental dose-optimization program which includes automated exposure control, adjustment of the mA and/or kV according to patient size and/or use of iterative reconstruction technique. CONTRAST:  75mL OMNIPAQUE IOHEXOL 350 MG/ML SOLN COMPARISON:  Renal ultrasound 11/17/2022; CT head and cervical spine 11/18/2018; CT 12/22/2012 FINDINGS: CT HEAD FINDINGS Brain: No evidence of acute infarction, hemorrhage, hydrocephalus,  extra-axial collection or mass lesion/mass effect. Vascular: No hyperdense vessel or unexpected calcification. Skull: Normal. Negative for fracture or focal lesion. Other: None. CT MAXILLOFACIAL FINDINGS Osseous: Fracture of the left orbit with fractures of the medial and inferior walls. Herniation of fat into the left maxillary sinus through the inferior floor defect. Orbits: The globes are intact.  No postseptal hematoma. Sinuses: About products in the left maxillary sinus. The sinuses are otherwise well aerated. No mastoid effusion. Soft tissues: Left forehead/periorbital hematoma. CT CERVICAL SPINE FINDINGS Alignment: No evidence of traumatic malalignment. Skull base and vertebrae: No acute fracture. Soft tissues and spinal canal: Thickening of the prevertebral soft tissues due to medialized carotid arteries posterior to the esophagus. No prevertebral swelling or hematoma. Disc levels: Age-related spondylosis greatest at C4-C5 and C5-C6 where it is moderate. Posterior disc osteophyte complex at C4-C5 and C5-C6 cause mild effacement the ventral thecal sac. No severe spinal canal narrowing. Other: Carotid calcification. CT CHEST FINDINGS Cardiovascular: No pericardial effusion. No evidence of aortic injury. Coronary artery and aortic atherosclerotic calcification. Mediastinum/Nodes: Trachea and esophagus are unremarkable. No mediastinal hematoma. Lungs/Pleura: Scattered scarring/atelectasis. No focal consolidation, pleural effusion, or pneumothorax. Musculoskeletal: No acute fracture.  Remote fracture of the sternum. CT ABDOMEN PELVIS FINDINGS Hepatobiliary: No hepatic laceration or hematoma. Hepatic cysts. Unremarkable gallbladder and biliary tree. Pancreas: Unremarkable. Spleen: No splenic laceration or hematoma. Adrenals/Urinary Tract: No adrenal hemorrhage. No renal laceration or hematoma. Low-attenuation lesions in the kidneys are statistically likely to represent cysts. No follow-up is required. Unremarkable  bladder. Stomach/Bowel: Normal caliber large and small bowel. Mild wall thickening of a loop of small bowel in the central mesentery likely reactive secondary to adjacent blood products. Decompressed stomach. There is some loss definition of the lateral wall of the gastric fundus (circa series 6/image 96). This is favored to represent silhouetting due to adjacent hemorrhage rather than perforation as there is no free intraperitoneal air. Colonic diverticulosis without diverticulitis. Vascular/Lymphatic: Aortic atherosclerosis. No enlarged abdominal or pelvic lymph nodes. Reproductive: Large heterogenous pelvic mass  measures 11.9 x 12.4 x 14.9 cm. This exerts mass effect on the uterus on lateral view. No discrete ovaries are seen. Other: Moderate-to-large hemoperitoneum greatest about the upper abdomen. The source of the hemorrhage is presumably the pelvic mass. No free intraperitoneal air. Musculoskeletal: No acute fracture. CT LUMBAR SPINE Segmentation: 5 lumbar type vertebrae. Alignment: No evidence of traumatic malalignment. Vertebrae: No acute fracture. Paraspinal and other soft tissues: See above. Disc levels: Decompressive laminectomy at L3-L4 and L4-L5. Multilevel advanced spondylosis and facet arthropathy. IMPRESSION: 1. No acute intracranial abnormality. 2. Acute blow out fracture of the left orbit with fractures of the medial and inferior walls. Herniation of fat through the inferior wall. Correlate for entrapment. 3. Left forehead/periorbital hematoma. 4. No acute fracture in the cervical spine. 5. No acute traumatic injury in the chest. 6. Large heterogenous pelvic mass measuring 11.9 x 12.4 x 14.9 cm likely adnexal or possibly uterine in origin. 7. Moderate-to-large hemoperitoneum greatest about the upper abdomen. The source of the hemorrhage is presumably the pelvic mass. 8. No acute fracture in the lumbar spine. Aortic Atherosclerosis (ICD10-I70.0). Critical Value/emergent results were called by  telephone at the time of interpretation on 19-Dec-2022 at 11:51 pm to provider Dr Andrey Campanile, who verbally acknowledged these results. Electronically Signed   By: Minerva Fester M.D.   On: 12/09/2022 00:26   CT CHEST ABDOMEN PELVIS W CONTRAST  Result Date: 12/09/2022 CLINICAL DATA:  Ground level fall on blood thinners EXAM: CT HEAD WITHOUT CONTRAST CT MAXILLOFACIAL WITHOUT CONTRAST CT CERVICAL SPINE WITHOUT CONTRAST CT CHEST, ABDOMEN AND PELVIS WITH CONTRAST TECHNIQUE: Contiguous axial images were obtained from the base of the skull through the vertex without intravenous contrast. Multidetector CT imaging of the maxillofacial structures was performed. Multiplanar CT image reconstructions were also generated. A small metallic BB was placed on the right temple in order to reliably differentiate right from left. Multidetector CT imaging of the cervical spine was performed without intravenous contrast. Multiplanar CT image reconstructions were also generated. Multidetector CT imaging of the chest, abdomen and pelvis was performed following the standard protocol during bolus administration of intravenous contrast. RADIATION DOSE REDUCTION: This exam was performed according to the departmental dose-optimization program which includes automated exposure control, adjustment of the mA and/or kV according to patient size and/or use of iterative reconstruction technique. CONTRAST:  75mL OMNIPAQUE IOHEXOL 350 MG/ML SOLN COMPARISON:  Renal ultrasound 11/17/2022; CT head and cervical spine 11/18/2018; CT 12/22/2012 FINDINGS: CT HEAD FINDINGS Brain: No evidence of acute infarction, hemorrhage, hydrocephalus, extra-axial collection or mass lesion/mass effect. Vascular: No hyperdense vessel or unexpected calcification. Skull: Normal. Negative for fracture or focal lesion. Other: None. CT MAXILLOFACIAL FINDINGS Osseous: Fracture of the left orbit with fractures of the medial and inferior walls. Herniation of fat into the left  maxillary sinus through the inferior floor defect. Orbits: The globes are intact.  No postseptal hematoma. Sinuses: About products in the left maxillary sinus. The sinuses are otherwise well aerated. No mastoid effusion. Soft tissues: Left forehead/periorbital hematoma. CT CERVICAL SPINE FINDINGS Alignment: No evidence of traumatic malalignment. Skull base and vertebrae: No acute fracture. Soft tissues and spinal canal: Thickening of the prevertebral soft tissues due to medialized carotid arteries posterior to the esophagus. No prevertebral swelling or hematoma. Disc levels: Age-related spondylosis greatest at C4-C5 and C5-C6 where it is moderate. Posterior disc osteophyte complex at C4-C5 and C5-C6 cause mild effacement the ventral thecal sac. No severe spinal canal narrowing. Other: Carotid calcification. CT CHEST FINDINGS Cardiovascular: No pericardial effusion.  No evidence of aortic injury. Coronary artery and aortic atherosclerotic calcification. Mediastinum/Nodes: Trachea and esophagus are unremarkable. No mediastinal hematoma. Lungs/Pleura: Scattered scarring/atelectasis. No focal consolidation, pleural effusion, or pneumothorax. Musculoskeletal: No acute fracture.  Remote fracture of the sternum. CT ABDOMEN PELVIS FINDINGS Hepatobiliary: No hepatic laceration or hematoma. Hepatic cysts. Unremarkable gallbladder and biliary tree. Pancreas: Unremarkable. Spleen: No splenic laceration or hematoma. Adrenals/Urinary Tract: No adrenal hemorrhage. No renal laceration or hematoma. Low-attenuation lesions in the kidneys are statistically likely to represent cysts. No follow-up is required. Unremarkable bladder. Stomach/Bowel: Normal caliber large and small bowel. Mild wall thickening of a loop of small bowel in the central mesentery likely reactive secondary to adjacent blood products. Decompressed stomach. There is some loss definition of the lateral wall of the gastric fundus (circa series 6/image 96). This is  favored to represent silhouetting due to adjacent hemorrhage rather than perforation as there is no free intraperitoneal air. Colonic diverticulosis without diverticulitis. Vascular/Lymphatic: Aortic atherosclerosis. No enlarged abdominal or pelvic lymph nodes. Reproductive: Large heterogenous pelvic mass measures 11.9 x 12.4 x 14.9 cm. This exerts mass effect on the uterus on lateral view. No discrete ovaries are seen. Other: Moderate-to-large hemoperitoneum greatest about the upper abdomen. The source of the hemorrhage is presumably the pelvic mass. No free intraperitoneal air. Musculoskeletal: No acute fracture. CT LUMBAR SPINE Segmentation: 5 lumbar type vertebrae. Alignment: No evidence of traumatic malalignment. Vertebrae: No acute fracture. Paraspinal and other soft tissues: See above. Disc levels: Decompressive laminectomy at L3-L4 and L4-L5. Multilevel advanced spondylosis and facet arthropathy. IMPRESSION: 1. No acute intracranial abnormality. 2. Acute blow out fracture of the left orbit with fractures of the medial and inferior walls. Herniation of fat through the inferior wall. Correlate for entrapment. 3. Left forehead/periorbital hematoma. 4. No acute fracture in the cervical spine. 5. No acute traumatic injury in the chest. 6. Large heterogenous pelvic mass measuring 11.9 x 12.4 x 14.9 cm likely adnexal or possibly uterine in origin. 7. Moderate-to-large hemoperitoneum greatest about the upper abdomen. The source of the hemorrhage is presumably the pelvic mass. 8. No acute fracture in the lumbar spine. Aortic Atherosclerosis (ICD10-I70.0). Critical Value/emergent results were called by telephone at the time of interpretation on December 26, 2022 at 11:51 pm to provider Dr Andrey Campanile, who verbally acknowledged these results. Electronically Signed   By: Minerva Fester M.D.   On: 12/09/2022 00:26   CT L-SPINE NO CHARGE  Result Date: 12/09/2022 CLINICAL DATA:  Ground level fall on blood thinners EXAM: CT HEAD  WITHOUT CONTRAST CT MAXILLOFACIAL WITHOUT CONTRAST CT CERVICAL SPINE WITHOUT CONTRAST CT CHEST, ABDOMEN AND PELVIS WITH CONTRAST TECHNIQUE: Contiguous axial images were obtained from the base of the skull through the vertex without intravenous contrast. Multidetector CT imaging of the maxillofacial structures was performed. Multiplanar CT image reconstructions were also generated. A small metallic BB was placed on the right temple in order to reliably differentiate right from left. Multidetector CT imaging of the cervical spine was performed without intravenous contrast. Multiplanar CT image reconstructions were also generated. Multidetector CT imaging of the chest, abdomen and pelvis was performed following the standard protocol during bolus administration of intravenous contrast. RADIATION DOSE REDUCTION: This exam was performed according to the departmental dose-optimization program which includes automated exposure control, adjustment of the mA and/or kV according to patient size and/or use of iterative reconstruction technique. CONTRAST:  75mL OMNIPAQUE IOHEXOL 350 MG/ML SOLN COMPARISON:  Renal ultrasound 11/17/2022; CT head and cervical spine 11/18/2018; CT 12/22/2012 FINDINGS: CT HEAD FINDINGS Brain: No  evidence of acute infarction, hemorrhage, hydrocephalus, extra-axial collection or mass lesion/mass effect. Vascular: No hyperdense vessel or unexpected calcification. Skull: Normal. Negative for fracture or focal lesion. Other: None. CT MAXILLOFACIAL FINDINGS Osseous: Fracture of the left orbit with fractures of the medial and inferior walls. Herniation of fat into the left maxillary sinus through the inferior floor defect. Orbits: The globes are intact.  No postseptal hematoma. Sinuses: About products in the left maxillary sinus. The sinuses are otherwise well aerated. No mastoid effusion. Soft tissues: Left forehead/periorbital hematoma. CT CERVICAL SPINE FINDINGS Alignment: No evidence of traumatic  malalignment. Skull base and vertebrae: No acute fracture. Soft tissues and spinal canal: Thickening of the prevertebral soft tissues due to medialized carotid arteries posterior to the esophagus. No prevertebral swelling or hematoma. Disc levels: Age-related spondylosis greatest at C4-C5 and C5-C6 where it is moderate. Posterior disc osteophyte complex at C4-C5 and C5-C6 cause mild effacement the ventral thecal sac. No severe spinal canal narrowing. Other: Carotid calcification. CT CHEST FINDINGS Cardiovascular: No pericardial effusion. No evidence of aortic injury. Coronary artery and aortic atherosclerotic calcification. Mediastinum/Nodes: Trachea and esophagus are unremarkable. No mediastinal hematoma. Lungs/Pleura: Scattered scarring/atelectasis. No focal consolidation, pleural effusion, or pneumothorax. Musculoskeletal: No acute fracture.  Remote fracture of the sternum. CT ABDOMEN PELVIS FINDINGS Hepatobiliary: No hepatic laceration or hematoma. Hepatic cysts. Unremarkable gallbladder and biliary tree. Pancreas: Unremarkable. Spleen: No splenic laceration or hematoma. Adrenals/Urinary Tract: No adrenal hemorrhage. No renal laceration or hematoma. Low-attenuation lesions in the kidneys are statistically likely to represent cysts. No follow-up is required. Unremarkable bladder. Stomach/Bowel: Normal caliber large and small bowel. Mild wall thickening of a loop of small bowel in the central mesentery likely reactive secondary to adjacent blood products. Decompressed stomach. There is some loss definition of the lateral wall of the gastric fundus (circa series 6/image 96). This is favored to represent silhouetting due to adjacent hemorrhage rather than perforation as there is no free intraperitoneal air. Colonic diverticulosis without diverticulitis. Vascular/Lymphatic: Aortic atherosclerosis. No enlarged abdominal or pelvic lymph nodes. Reproductive: Large heterogenous pelvic mass measures 11.9 x 12.4 x 14.9 cm.  This exerts mass effect on the uterus on lateral view. No discrete ovaries are seen. Other: Moderate-to-large hemoperitoneum greatest about the upper abdomen. The source of the hemorrhage is presumably the pelvic mass. No free intraperitoneal air. Musculoskeletal: No acute fracture. CT LUMBAR SPINE Segmentation: 5 lumbar type vertebrae. Alignment: No evidence of traumatic malalignment. Vertebrae: No acute fracture. Paraspinal and other soft tissues: See above. Disc levels: Decompressive laminectomy at L3-L4 and L4-L5. Multilevel advanced spondylosis and facet arthropathy. IMPRESSION: 1. No acute intracranial abnormality. 2. Acute blow out fracture of the left orbit with fractures of the medial and inferior walls. Herniation of fat through the inferior wall. Correlate for entrapment. 3. Left forehead/periorbital hematoma. 4. No acute fracture in the cervical spine. 5. No acute traumatic injury in the chest. 6. Large heterogenous pelvic mass measuring 11.9 x 12.4 x 14.9 cm likely adnexal or possibly uterine in origin. 7. Moderate-to-large hemoperitoneum greatest about the upper abdomen. The source of the hemorrhage is presumably the pelvic mass. 8. No acute fracture in the lumbar spine. Aortic Atherosclerosis (ICD10-I70.0). Critical Value/emergent results were called by telephone at the time of interpretation on 12/20/2022 at 11:51 pm to provider Dr Andrey Campanile, who verbally acknowledged these results. Electronically Signed   By: Minerva Fester M.D.   On: 12/09/2022 00:26   DG Pelvis Portable  Result Date: 12/10/2022 CLINICAL DATA:  Trauma, fall.  Pain. EXAM: PORTABLE  PELVIS 1-2 VIEWS COMPARISON:  None Available. FINDINGS: The bones are under mineralized. The cortical margins of the bony pelvis are intact. No fracture. Pubic symphysis and sacroiliac joints are congruent. Both femoral heads are well-seated in the respective acetabula. Mild bilateral hip osteoarthritis. IMPRESSION: No pelvic fracture. Electronically  Signed   By: Narda Rutherford M.D.   On: 12/29/2022 23:59   DG Chest Port 1 View  Result Date: 12/26/2022 CLINICAL DATA:  Trauma, fall. EXAM: PORTABLE CHEST 1 VIEW COMPARISON:  Subsequent chest CT available at time of radiograph interpretation. FINDINGS: The cardiomediastinal contours are normal. Subsegmental atelectasis at the right lung base with elevated hemidiaphragm. Pulmonary vasculature is normal. No consolidation, pleural effusion, or pneumothorax. No acute osseous abnormalities are seen. IMPRESSION: Subsegmental atelectasis at the right lung base with elevated hemidiaphragm. Electronically Signed   By: Narda Rutherford M.D.   On: 12/10/2022 23:58   MM 3D SCREENING MAMMOGRAM BILATERAL BREAST  Result Date: 12/07/2022 CLINICAL DATA:  Screening. EXAM: DIGITAL SCREENING BILATERAL MAMMOGRAM WITH TOMOSYNTHESIS AND CAD TECHNIQUE: Bilateral screening digital craniocaudal and mediolateral oblique mammograms were obtained. Bilateral screening digital breast tomosynthesis was performed. The images were evaluated with computer-aided detection. COMPARISON:  Previous exam(s). ACR Breast Density Category b: There are scattered areas of fibroglandular density. FINDINGS: There are no findings suspicious for malignancy. IMPRESSION: No mammographic evidence of malignancy. A result letter of this screening mammogram will be mailed directly to the patient. RECOMMENDATION: Screening mammogram in one year. (Code:SM-B-01Y) BI-RADS CATEGORY  1: Negative. Electronically Signed   By: Norva Pavlov M.D.   On: 12/07/2022 14:55   US RENAL  Result Date: 12/05/2022 CLINICAL DATA:  Impaired renal function. EXAM: RENAL / URINARY TRACT ULTRASOUND COMPLETE COMPARISON:  None Available. FINDINGS: Right Kidney: Length: 9.3 cm. Echogenicity within normal limits. No hydronephrosis. Cyst upper pole measures 1.2 cm. Left Kidney: Length: 10.8 cm. Echogenicity within normal limits. Midpole parapelvic cyst measures 5 cm. Upper pole  complex cyst versus solid nodule measures 3.6 cm. Upper pole cyst measures 6.3 cm. Bladder: Suboptimally visualized. Incidental note made of a complex cystic hypervascular suspicious mass in the pelvis on the right measuring 13 x 12 x 8 cm. IMPRESSION: 1. Bilateral renal cysts. 2. Indeterminate mass left kidney. 3. Suspicious 13 cm mass in the pelvis. 4. Further evaluation recommended with CT of the abdomen and pelvis with oral and IV contrast to include pre- and postcontrast thin section images of the kidneys. Electronically Signed   By: Layla Maw M.D.   On: 12/05/2022 08:35    Microbiology Recent Results (from the past 240 hour(s))  Blood Culture (routine x 2)     Status: None (Preliminary result)   Collection Time: 12/02/2022 11:48 PM   Specimen: BLOOD  Result Value Ref Range Status   Specimen Description BLOOD LEFT ANTECUBITAL  Final   Special Requests   Final    BOTTLES DRAWN AEROBIC AND ANAEROBIC Blood Culture adequate volume   Culture   Final    NO GROWTH 3 DAYS Performed at [facility], [address] [place], [place] [address]    Report Status PENDING  Incomplete  Blood Culture (routine x 2)     Status: None (Preliminary result)   Collection Time: 12/13/2022 11:55 PM   Specimen: BLOOD RIGHT HAND  Result Value Ref Range Status   Specimen Description BLOOD RIGHT HAND  Final   Special Requests   Final    BOTTLES DRAWN AEROBIC ONLY Blood Culture results may not be optimal due  to an inadequate volume of blood received in culture bottles   Culture   Final    NO GROWTH 3 DAYS Performed at [facility], [address], [place], [place] [address]    Report Status PENDING  Incomplete  Resp panel by RT-PCR (RSV, Flu A&B, Covid) Anterior Nasal Swab     Status: None   Collection Time: 12/09/22  2:52 AM   Specimen: Anterior Nasal Swab  Result Value Ref Range Status   SARS Coronavirus 2 by RT PCR NEGATIVE NEGATIVE Final   Influenza A by PCR NEGATIVE NEGATIVE Final    Influenza B by PCR NEGATIVE NEGATIVE Final    Comment: (NOTE) The Xpert Xpress SARS-CoV-2/FLU/RSV plus assay is intended as an aid in the diagnosis of influenza from Nasopharyngeal swab specimens and should not be used as a sole basis for treatment. Nasal washings and aspirates are unacceptable for Xpert Xpress SARS-CoV-2/FLU/RSV testing.  Fact Sheet for Patients: BloggerCourse.com  Fact Sheet for Healthcare Providers: SeriousBroker.it  This test is not yet approved or cleared by the [place] FDA and has been authorized for detection and/or diagnosis of SARS-CoV-2 by FDA under an Emergency Use Authorization (EUA). This EUA will remain in effect (meaning this test can be used) for the duration of the COVID-19 declaration under Section 564(b)(1) of the Act, 21 U.S.C. section 360bbb-3(b)(1), unless the authorization is terminated or revoked.     Resp Syncytial Virus by PCR NEGATIVE NEGATIVE Final    Comment: (NOTE) Fact Sheet for Patients: BloggerCourse.com  Fact Sheet for Healthcare Providers: SeriousBroker.it  This test is not yet approved or cleared by the [place] FDA and has been authorized for detection and/or diagnosis of SARS-CoV-2 by FDA under an Emergency Use Authorization (EUA). This EUA will remain in effect (meaning this test can be used) for the duration of the COVID-19 declaration under Section 564(b)(1) of the Act, 21 U.S.C. section 360bbb-3(b)(1), unless the authorization is terminated or revoked.  Performed at [facility], [address] [place], [place] [address]   MRSA Next Gen by PCR, Nasal     Status: None   Collection Time: 12/09/22  9:04 AM   Specimen: Nasal Mucosa; Nasal Swab  Result Value Ref Range Status   MRSA by PCR Next Gen NOT DETECTED NOT DETECTED Final    Comment: (NOTE) The GeneXpert MRSA Assay (FDA approved for NASAL  specimens only), is one component of a comprehensive MRSA colonization surveillance program. It is not intended to diagnose MRSA infection nor to guide or monitor treatment for MRSA infections. Test performance is not FDA approved in patients less than 23 years old. Performed at [facility], [address], [place], [place] [address]     Lab Basic Metabolic Panel: Recent Labs  Lab 12/25/2022 2253 12/13/2022 2305 12/09/22 0252 12/10/22 0305 12/11/22 0303 12/11/22 0805 2023/01/10 0209 January 10, 2023 0313 01/10/2023 0527 10-Jan-2023 0740  NA 140 138 135 140 138 137 139 136 136 136   K 4.8 4.7 4.1 4.5 4.8 4.8 5.1 5.0 5.1 5.1  CL 110 110 104 108 106  --  105  --   --   --   CO2 9*  --  11* 15* 15*  --  16*  --   --   --   GLUCOSE 197* 190* 76 130* 131*  --  122*  --   --   --   BUN 32* 43* 28* 39* 48*  --  55*  --   --   --  CREATININE 3.06* 3.20* 2.26* 3.88* 5.41*  --  6.97*  --   --   --   CALCIUM 8.8*  --  7.0* 8.1* 7.5*  --  7.6*  --   --   --   MG  --   --  2.0  --   --   --   --   --   --   --   PHOS  --   --  6.0*  --   --   --   --   --   --   --    Liver Function Tests: Recent Labs  Lab Dec 20, 2022 2253 12/10/22 0305 12/11/22 0303 12/24/2022 0209  AST 28 54* 151* 108*  ALT 17 38 71* 42  ALKPHOS 45 49 45 60  BILITOT 1.3* 1.0 0.7 0.7  PROT 4.9* 5.0* 5.8* 5.7*  ALBUMIN 2.8* 2.8* 3.9 3.4*   No results for input(s): "LIPASE", "AMYLASE" in the last 168 hours. No results for input(s): "AMMONIA" in the last 168 hours. CBC: Recent Labs  Lab 2022/12/20 2253 12/20/2022 2305 12/09/22 0252 12/09/22 1202 12/10/22 0305 12/11/22 0805 12/11/22 1610 12/02/2022 0209 12/19/2022 0313 12/09/2022 0527 12/04/2022 0740 12/26/2022 0859  WBC 20.8*  --  20.2*  --  28.8*  --  32.0* 32.5*  --   --   --   --   HGB 6.7*   < > 6.1*   < > 9.1*   < > 7.0* 7.2* 8.2* 8.2* 9.2* 8.9*  HCT 22.5*   < > 20.5*   < > 27.4*   < > 21.7* 22.6* 24.0* 24.0* 27.0* 27.6*  MCV 99.6  --  97.2  --  89.3  --  93.9 94.6  --    --   --   --   PLT 160  --  106*  --  116*  --  100* 112*  --   --   --   --    < > = values in this interval not displayed.   Cardiac Enzymes: No results for input(s): "CKTOTAL", "CKMB", "CKMBINDEX", "TROPONINI" in the last 168 hours. Sepsis Labs: Recent Labs  Lab 12/09/22 0252 12/09/22 0449 12/09/22 1202 12/10/22 0305 12/10/22 0945 12/10/22 1239 12/11/22 0832 12/09/2022 0209  PROCALCITON  --   --   --   --  2.48  --   --   --   WBC 20.2*  --   --  28.8*  --   --  32.0* 32.5*  LATICACIDVEN 7.8* 8.2* 4.1*  --  1.9 1.8  --   --     Procedures/Operations     [employer] 12/15/2022, 6:34 PM

## 2022-12-30 NOTE — IPAL (Signed)
Interdisciplinary Goals of Care Family Meeting   Date carried out: 12/09/2022  Location of the meeting: Bedside  Member's involved: Physician, Bedside Registered Nurse, and Family Member or next of kin  Durable Power of Attorney or acting medical decision maker: Kathyrn Drown  Discussion: We discussed goals of care for [facility] .  Patient's condition was updated to her whole family at bedside, stated that she is going into renal failure, that will require hemodialysis but patient has refused that she would not want dialysis, considering with progressive renal failure and not making urine without dialysis she will not be able to survive.  Also she is requiring BiPAP due to respiratory failure, with altered mental status she will need to be on mechanical ventilator and she has large pelvic tumor which is bleeding and requiring frequent blood transfusion despite embolization of the artery. I explained that patient is going through multiple organ failures including renal failure, respiratory failure and recurrent bleeding from tumor that will require extensive surgery once she is medically stable.  Patient family stated that as she would not want hemodialysis she would not want aggressive care, they opted for comfort focused care.  Comfort care orders were written.  Code status:   Code Status: Do not attempt resuscitation (DNR) - Comfort care   Disposition: In-patient comfort care     Cheri Fowler, MD [facility] See Amion for pager If no response to pager, please call [phone] until 7pm After 7pm, Please call E-link [phone]

## 2022-12-30 NOTE — Progress Notes (Signed)
Tamara Larson came back to unit to pick up patients belongings left: 1 ring, 1 bracelet and flowers.

## 2022-12-30 NOTE — Progress Notes (Signed)
NAME:  Tamara Larson, MRN:  [id number], DOB:  1940/12/09, LOS: 3 ADMISSION DATE:  12/19/2022, CONSULTATION DATE:  29-Dec-2022  REFERRING MD:  Blinda Leatherwood, EDP , CHIEF COMPLAINT: Fall, hemoperitoneum  History of Present Illness:  82 year old woman with A-fib on Eliquis, BIBEMS from home after fall.  She had laser surgery on her eye for glaucoma 1 week ago.  She called her sister complaining of generalized weakness and by the time they got there, they had to force the door and found her on the floor.    In the ED, she was noted to have swelling and bruising around her left eye and abrasions on her kneeAnd reported head injury, hypotensive to the 80s, level 1 trauma was called. Labs showed hemoglobin of 6.7, lactate more than 15 She was given fluids and empiric cefepime, vancomycin and Flagyl Pan CT showed acute blowout fracture of the left orbit with fractures of the medial and inferior walls with herniation of fat, left periorbital hematoma, large pelvic mass measuring 12 x 12 x 15 cm, moderate to large hemoperitoneum more in the upper abdomen than in the pelvis.  Pertinent  Medical History  Hypertension Paroxysmal atrial fibrillation Hypothyroidism CAD with stent  Significant Hospital Events: Including procedures, antibiotic start and stop dates in addition to other pertinent events   10/11 found after ground-level fall with hypotension in the setting moderate to large hemoperitoneum.  Trauma and IR consulted, patient underwent embolization to internal iliac artery 10/12 remains pressor dependent with borderline hypotension and worsening AKI 10/13-still on pressors, still on amiodarone  Interim History / Subjective:  Had increased work of breathing required BiPAP overnight Hemoglobin dropped down to 7.2, received 1 unit PRBC Serum creatinine continue to trend up  Objective   Blood pressure 92/60, pulse (!) 119, temperature 97.7 F (36.5 C), temperature source Axillary, resp. rate 17,  weight 84.5 kg, SpO2 100%.    FiO2 (%):  [70 %-100 %] 70 % Pressure Support:  [8 cmH20-12 cmH20] 12 cmH20   Intake/Output Summary (Last 24 hours) at December 29, 2022 1350 Last data filed at 12/29/22 1100 Gross per 24 hour  Intake 1255.93 ml  Output 10 ml  Net 1245.93 ml   Filed Weights   12/06/2022 2303  Weight: 84.5 kg    Examination: General: Crtitically ill-appearing female, on BiPAP HEENT: Left orbital injury, eyes anicteric.   Neuro: Eyes closed, does not open, not following commands Chest: Bilateral rhonchorous breath sounds Heart: Regular rate and rhythm, no murmurs or gallops Abdomen: Soft, nondistended, bowel sounds present Skin: No rash  Labs and images reviewed  Resolved Hospital Problem list     Assessment & Plan:  Hemorrhagic shock with hemoperitoneum Hemoperitoneum from large pelvic mass-May be uterine/ovarian Acute blood loss anemia Status post embolization of the internal iliac artery per IR on 10/11 Hemoglobin continue to trend down Requiring low-dose vasopressor support Continue IV Protonix Avoid antiplatelet and anticoagulation  Septic shock due to aspiration pneumonia Continue IV antibiotics with ceftriaxone Continue vasopressor support with MAP goal 65 Follow-up cultures  Acute hypoxemic/hypercapnic respiratory failure Continue BiPAP for now, transition to high flow nasal cannula oxygen Titrate with O2 sat goal 92%  Paroxysmal atrial fibrillation Patient was on Eliquis for stroke prophylaxis Received Kcentra on admission Hold anticoagulation Continue IV amiodarone infusion, patient is in sinus rhythm Discontinue metoprolol considering requirement of pressors  AKI on chronic kidney disease stage IV Acute metabolic acidosis Hypocalcemia Serum creatinine continue to trend down with minimal urine output Patient does not want hemodialysis,  unfortunately her electrolytes are getting worse, she developed metabolic acidosis Will give 2 A of  bicarbonate Continue goals of care discussion  Left orbital wall fracture Continue supportive care Continue pain management   Best Practice (right click and "Reselect all SmartList Selections" daily)   Diet/type: Regular consistency (see orders) DVT prophylaxis: SCD GI prophylaxis: N/A Lines: N/A Foley:  N/A Code Status:  full code  The patient is critically ill due to septic/hemorrhagic shock.  Critical care was necessary to treat or prevent imminent or life-threatening deterioration.  Critical care was time spent personally by me on the following activities: development of treatment plan with patient and/or surrogate as well as nursing, discussions with consultants, evaluation of patient's response to treatment, examination of patient, obtaining history from patient or surrogate, ordering and performing treatments and interventions, ordering and review of laboratory studies, ordering and review of radiographic studies, pulse oximetry, re-evaluation of patient's condition and participation in multidisciplinary rounds.   During this encounter critical care time was devoted to patient care services described in this note for 41 minutes.     Cheri Fowler, MD [place] [facility] [facility] See Amion for pager If no response to pager, please call [phone] until 7pm After 7pm, Please call E-link [phone]

## 2022-12-30 NOTE — Progress Notes (Addendum)
eLink Physician-Brief Progress Note Patient Name: Tamara Larson DOB: Jan 05, 1941 MRN: [id number]   Date of Service  12/01/2022  HPI/Events of Note  Labs review and discussion with RN.  Hg 7.2, decreasing.   Hemorrhagic shock, hemoperitoneum s/p IR embolization. AKI/P afib/CAD On levo 9 via PIV.   Camera: Stable on my camera eval. MAP 67. HR and sats good. Obese.  On nasal o2.   eICU Interventions  Get stat ABG Transfuse 1 PRBC and follow Hg      Intervention Category Intermediate Interventions: Hypotension - evaluation and management;Other:  Ranee Gosselin 12/06/2022, 2:56 AM  03:34 AM ABG reviewed Peristing type 2 failure. On bicarb tablets. Camera re eval done.  Did not see any Contraindication for BiPAP 10/5 Discussed with RN. Follow ABG at 5 AM. Asp precautions  05:54 Abg reviewed. Improving resp acidosis, pH from 7.10 to 7.126. metabolic and resp acidosis, hco3 at 15.  Has CKD. Stage 4-5 K 5.1 Anemia stable at 8.2 CxR : morbidly obese, very low lung volume.  - BiPAp increase to 18/8. Wean fio2 as tolerated. - bicarb 50 meq IV once.  - follow Abg at 7.30 AM

## 2022-12-30 NOTE — Progress Notes (Signed)
Pressor requirements increasing. Now infusing levophed at 8mcg/min. Elink notified.

## 2022-12-30 NOTE — Progress Notes (Signed)
Patient had gradually decreasing BP and respirations over last hour. She appeared comfortable on Morphine gtt of 5mg /hr. She deteriorated to asystole at 1740. Priscella Mann, RN and Junious Silk pronounced her death at 76. No respiratory effort or breath sounds, no heart tones, pupils 3 and nonreactive. Dr. Merrily Pew was notified and declined an autopsy. Family was present at time of patient passing. They do not know funeral home at this time but were given phone # for patient placement to let the hospital know when this is determined.

## 2022-12-30 NOTE — Progress Notes (Signed)
Trauma/Critical Care Follow Up Note  Subjective:    Overnight Issues:   Objective:  Vital signs for last 24 hours: Temp:  [96.8 F (36 C)-97.7 F (36.5 C)] 97.7 F (36.5 C) 01-01-23 0728) Pulse Rate:  [100-136] 101 (10/14 1030) Resp:  [10-36] 22 (10/14 1030) BP: (73-121)/(30-99) 78/59 (10/14 1030) SpO2:  [85 %-100 %] 100 % (10/14 1030) FiO2 (%):  [70 %-100 %] 70 % Jan 01, 2023 0953)  Hemodynamic parameters for last 24 hours:    Intake/Output from previous day: 10/13 0701 - 10/14 0700 In: 1350.9 [I.V.:896.1; Blood:345; IV Piggyback:99.8] Out: 30 [Urine:30]  Intake/Output this shift: No intake/output data recorded.  Vent settings for last 24 hours: FiO2 (%):  [70 %-100 %] 70 % Pressure Support:  [8 cmH20-12 cmH20] 12 cmH20  Physical Exam:  Gen: comfortable, no distress Neuro: follows commands HEENT: PERRL Neck: supple CV: RRR Pulm: moderately labored breathing on  bipap Abd: soft, NT    GU: urine clear and yellow, +Foley Extr: wwp, no edema  Results for orders placed or performed during the hospital encounter of 12/25/2022 (from the past 24 hour(s))  CBC     Status: Abnormal   Collection Time: January 01, 2023  2:09 AM  Result Value Ref Range   WBC 32.5 (H) 4.0 - 10.5 K/uL   RBC 2.39 (L) 3.87 - 5.11 MIL/uL   Hemoglobin 7.2 (L) 12.0 - 15.0 g/dL   HCT 78.2 (L) 95.6 - 21.3 %   MCV 94.6 80.0 - 100.0 fL   MCH 30.1 26.0 - 34.0 pg   MCHC 31.9 30.0 - 36.0 g/dL   RDW 08.6 (H) 57.8 - 46.9 %   Platelets 112 (L) 150 - 400 K/uL   nRBC 1.5 (H) 0.0 - 0.2 %  Comprehensive metabolic panel     Status: Abnormal   Collection Time: 2023-01-01  2:09 AM  Result Value Ref Range   Sodium 139 135 - 145 mmol/L   Potassium 5.1 3.5 - 5.1 mmol/L   Chloride 105 98 - 111 mmol/L   CO2 16 (L) 22 - 32 mmol/L   Glucose, Bld 122 (H) 70 - 99 mg/dL   BUN 55 (H) 8 - 23 mg/dL   Creatinine, Ser 6.29 (H) 0.44 - 1.00 mg/dL   Calcium 7.6 (L) 8.9 - 10.3 mg/dL   Total Protein 5.7 (L) 6.5 - 8.1 g/dL   Albumin 3.4  (L) 3.5 - 5.0 g/dL   AST 528 (H) 15 - 41 U/L   ALT 42 0 - 44 U/L   Alkaline Phosphatase 60 38 - 126 U/L   Total Bilirubin 0.7 0.3 - 1.2 mg/dL   GFR, Estimated 6 (L) >60 mL/min   Anion gap 18 (H) 5 - 15  Prepare RBC (crossmatch)     Status: None   Collection Time: 01-01-2023  3:03 AM  Result Value Ref Range   Order Confirmation      ORDER PROCESSED BY BLOOD BANK Performed at [facility], [address] [place], [place] [address]   I-STAT 7, (LYTES, BLD GAS, ICA, H+H)     Status: Abnormal   Collection Time: 01-01-2023  3:13 AM  Result Value Ref Range   pH, Arterial 7.103 (LL) 7.35 - 7.45   pCO2 arterial 50.4 (H) 32 - 48 mmHg   pO2, Arterial 52 (L) 83 - 108 mmHg   Bicarbonate 15.7 (L) 20.0 - 28.0 mmol/L   TCO2 17 (L) 22 - 32 mmol/L   O2 Saturation 73 %   Acid-base  deficit 13.0 (H) 0.0 - 2.0 mmol/L   Sodium 136 135 - 145 mmol/L   Potassium 5.0 3.5 - 5.1 mmol/L   Calcium, Ion 1.06 (L) 1.15 - 1.40 mmol/L   HCT 24.0 (L) 36.0 - 46.0 %   Hemoglobin 8.2 (L) 12.0 - 15.0 g/dL   Sample type ARTERIAL    Comment NOTIFIED PHYSICIAN   Type and screen [facility]     Status: None (Preliminary result)   Collection Time: 2023-01-10  3:16 AM  Result Value Ref Range   ABO/RH(D) O POS    Antibody Screen NEG    Sample Expiration 12/15/2022,2359    Unit Number [id number]    Blood Component Type RED CELLS,LR    Unit division 00    Status of Unit ISSUED    Transfusion Status OK TO TRANSFUSE    Crossmatch Result      Compatible Performed at [facility], [address] [place], [place] [address]   I-STAT 7, (LYTES, BLD GAS, ICA, H+H)     Status: Abnormal   Collection Time: January 10, 2023  5:27 AM  Result Value Ref Range   pH, Arterial 7.126 (LL) 7.35 - 7.45   pCO2 arterial 46.4 32 - 48 mmHg   pO2, Arterial 88 83 - 108 mmHg   Bicarbonate 15.3 (L) 20.0 - 28.0 mmol/L   TCO2 17 (L) 22 - 32 mmol/L   O2 Saturation 93 %   Acid-base deficit 13.0 (H) 0.0 - 2.0 mmol/L    Sodium 136 135 - 145 mmol/L   Potassium 5.1 3.5 - 5.1 mmol/L   Calcium, Ion 1.04 (L) 1.15 - 1.40 mmol/L   HCT 24.0 (L) 36.0 - 46.0 %   Hemoglobin 8.2 (L) 12.0 - 15.0 g/dL   Sample type ARTERIAL    Comment NOTIFIED PHYSICIAN   I-STAT 7, (LYTES, BLD GAS, ICA, H+H)     Status: Abnormal   Collection Time: 01/10/23  7:40 AM  Result Value Ref Range   pH, Arterial 7.165 (LL) 7.35 - 7.45   pCO2 arterial 48.0 32 - 48 mmHg   pO2, Arterial 92 83 - 108 mmHg   Bicarbonate 17.3 (L) 20.0 - 28.0 mmol/L   TCO2 19 (L) 22 - 32 mmol/L   O2 Saturation 94 %   Acid-base deficit 11.0 (H) 0.0 - 2.0 mmol/L   Sodium 136 135 - 145 mmol/L   Potassium 5.1 3.5 - 5.1 mmol/L   Calcium, Ion 1.04 (L) 1.15 - 1.40 mmol/L   HCT 27.0 (L) 36.0 - 46.0 %   Hemoglobin 9.2 (L) 12.0 - 15.0 g/dL   Sample type ARTERIAL    Comment NOTIFIED PHYSICIAN   Hemoglobin and hematocrit, blood     Status: Abnormal   Collection Time: 01/10/2023  8:59 AM  Result Value Ref Range   Hemoglobin 8.9 (L) 12.0 - 15.0 g/dL   HCT 78.2 (L) 95.6 - 21.3 %    Assessment & Plan: Present on Admission:  Hemorrhagic shock (HCC)    LOS: 3 days   Additional comments:I reviewed the patient's new clinical lab test results.   and I reviewed the patients new imaging test results.    82 y/o F w/ a hx of Afib on Eliquis, CAD s/p stent, CKD, HTN, and hypothyroidism who presented after a FFS w/ hemoperitoneum and a left orbital blowout    - txf 1u pRBC this AM, no abdominal pain, suspect she is not actively bleeding after AE. Repeat CBC this PM. -will potentially  need GYN/GYN-ONC opinion regarding pelvis mass -ok to get out of bed from a surgical standpoint -creatinine increasing, uop low, per documentation would not want RRT - on bipap, unclear desires re: intubation  Recommend GoC discussion with patient and family. D/w Dr. Merrily Pew and I am available to participate in these discussions as needed.    Diamantina Monks, MD Trauma & General Surgery Please  use [email or web address] to contact on call provider  2022-12-20  *Care during the described time interval was provided by me. I have reviewed this patient's available data, including medical history, events of note, physical examination and test results as part of my evaluation.

## 2022-12-30 NOTE — Progress Notes (Signed)
Remainder of Morphine drip 1mg /45ml wasted after patient expired. 58cc Morphine drip wasted in stericycle in med room. Witnessed by Benard Rink, RN

## 2022-12-30 DEATH — deceased

## 2023-01-02 ENCOUNTER — Encounter: Payer: Medicare HMO | Admitting: Nurse Practitioner

## 2023-01-02 ENCOUNTER — Ambulatory Visit: Payer: Medicare HMO | Admitting: Cardiovascular Disease

## 2023-02-10 ENCOUNTER — Other Ambulatory Visit: Payer: Self-pay | Admitting: Nurse Practitioner

## 2023-02-10 DIAGNOSIS — I10 Essential (primary) hypertension: Secondary | ICD-10-CM

## 2023-02-26 ENCOUNTER — Other Ambulatory Visit: Payer: Self-pay | Admitting: Nurse Practitioner

## 2023-02-26 DIAGNOSIS — E034 Atrophy of thyroid (acquired): Secondary | ICD-10-CM
